# Patient Record
Sex: Female | Born: 1970 | Hispanic: Yes | Marital: Married | Smoking: Never smoker
Health system: Southern US, Community
[De-identification: ages and names within clinical notes are randomized; demographics above are authoritative.]

## PROBLEM LIST (undated history)

## (undated) DIAGNOSIS — E785 Hyperlipidemia, unspecified: Secondary | ICD-10-CM

## (undated) DIAGNOSIS — J449 Chronic obstructive pulmonary disease, unspecified: Secondary | ICD-10-CM

## (undated) DIAGNOSIS — F429 Obsessive-compulsive disorder, unspecified: Secondary | ICD-10-CM

## (undated) DIAGNOSIS — T7840XA Allergy, unspecified, initial encounter: Secondary | ICD-10-CM

## (undated) DIAGNOSIS — J45909 Unspecified asthma, uncomplicated: Secondary | ICD-10-CM

## (undated) DIAGNOSIS — B009 Herpesviral infection, unspecified: Secondary | ICD-10-CM

## (undated) DIAGNOSIS — E119 Type 2 diabetes mellitus without complications: Secondary | ICD-10-CM

## (undated) DIAGNOSIS — D649 Anemia, unspecified: Secondary | ICD-10-CM

## (undated) DIAGNOSIS — H409 Unspecified glaucoma: Secondary | ICD-10-CM

## (undated) DIAGNOSIS — B0050 Herpesviral ocular disease, unspecified: Secondary | ICD-10-CM

## (undated) HISTORY — DX: Allergy, unspecified, initial encounter: T78.40XA

## (undated) HISTORY — PX: NO PAST SURGERIES: SHX2092

## (undated) HISTORY — DX: Obsessive-compulsive disorder, unspecified: F42.9

## (undated) HISTORY — DX: Hyperlipidemia, unspecified: E78.5

## (undated) HISTORY — DX: Anemia, unspecified: D64.9

## (undated) HISTORY — DX: Herpesviral ocular disease, unspecified: B00.50

## (undated) HISTORY — DX: Unspecified asthma, uncomplicated: J45.909

---

## 2002-09-22 ENCOUNTER — Emergency Department (HOSPITAL_COMMUNITY): Admission: EM | Admit: 2002-09-22 | Discharge: 2002-09-22 | Payer: Self-pay | Admitting: Emergency Medicine

## 2004-06-24 ENCOUNTER — Ambulatory Visit: Payer: Self-pay | Admitting: Family Medicine

## 2004-07-29 ENCOUNTER — Ambulatory Visit: Payer: Self-pay | Admitting: *Deleted

## 2004-07-30 ENCOUNTER — Ambulatory Visit: Payer: Self-pay | Admitting: Family Medicine

## 2004-08-31 ENCOUNTER — Ambulatory Visit: Payer: Self-pay | Admitting: Family Medicine

## 2004-09-02 ENCOUNTER — Emergency Department (HOSPITAL_COMMUNITY): Admission: EM | Admit: 2004-09-02 | Discharge: 2004-09-02 | Payer: Self-pay | Admitting: Emergency Medicine

## 2004-09-07 ENCOUNTER — Ambulatory Visit: Payer: Self-pay | Admitting: Family Medicine

## 2004-09-19 ENCOUNTER — Emergency Department (HOSPITAL_COMMUNITY): Admission: EM | Admit: 2004-09-19 | Discharge: 2004-09-19 | Payer: Self-pay | Admitting: Emergency Medicine

## 2004-10-13 ENCOUNTER — Ambulatory Visit: Payer: Self-pay | Admitting: Family Medicine

## 2004-12-29 ENCOUNTER — Emergency Department (HOSPITAL_COMMUNITY): Admission: EM | Admit: 2004-12-29 | Discharge: 2004-12-29 | Payer: Self-pay | Admitting: Emergency Medicine

## 2005-06-01 ENCOUNTER — Ambulatory Visit: Payer: Self-pay | Admitting: Internal Medicine

## 2005-06-21 ENCOUNTER — Ambulatory Visit: Payer: Self-pay | Admitting: Family Medicine

## 2005-10-26 ENCOUNTER — Other Ambulatory Visit: Admission: RE | Admit: 2005-10-26 | Discharge: 2005-10-26 | Payer: Self-pay | Admitting: Obstetrics & Gynecology

## 2005-12-09 ENCOUNTER — Emergency Department (HOSPITAL_COMMUNITY): Admission: EM | Admit: 2005-12-09 | Discharge: 2005-12-09 | Payer: Self-pay | Admitting: Emergency Medicine

## 2008-03-19 ENCOUNTER — Emergency Department (HOSPITAL_COMMUNITY): Admission: EM | Admit: 2008-03-19 | Discharge: 2008-03-19 | Payer: Self-pay | Admitting: Emergency Medicine

## 2009-12-02 ENCOUNTER — Encounter: Admission: RE | Admit: 2009-12-02 | Discharge: 2009-12-02 | Payer: Self-pay | Admitting: Ophthalmology

## 2010-12-10 ENCOUNTER — Emergency Department (HOSPITAL_BASED_OUTPATIENT_CLINIC_OR_DEPARTMENT_OTHER)
Admission: EM | Admit: 2010-12-10 | Discharge: 2010-12-10 | Disposition: A | Discharge status: Home / Self Care | Payer: Self-pay | Attending: Emergency Medicine | Admitting: Emergency Medicine

## 2010-12-10 ENCOUNTER — Emergency Department (INDEPENDENT_AMBULATORY_CARE_PROVIDER_SITE_OTHER): Payer: Self-pay

## 2010-12-10 DIAGNOSIS — R05 Cough: Secondary | ICD-10-CM | POA: Insufficient documentation

## 2010-12-10 DIAGNOSIS — R059 Cough, unspecified: Secondary | ICD-10-CM | POA: Insufficient documentation

## 2010-12-10 DIAGNOSIS — J45909 Unspecified asthma, uncomplicated: Secondary | ICD-10-CM | POA: Insufficient documentation

## 2010-12-31 ENCOUNTER — Emergency Department (HOSPITAL_BASED_OUTPATIENT_CLINIC_OR_DEPARTMENT_OTHER)
Admission: EM | Admit: 2010-12-31 | Discharge: 2010-12-31 | Disposition: A | Discharge status: Home / Self Care | Payer: Self-pay | Attending: Orthopaedic Surgery | Admitting: Orthopaedic Surgery

## 2010-12-31 DIAGNOSIS — J40 Bronchitis, not specified as acute or chronic: Secondary | ICD-10-CM | POA: Insufficient documentation

## 2010-12-31 DIAGNOSIS — H109 Unspecified conjunctivitis: Secondary | ICD-10-CM | POA: Insufficient documentation

## 2010-12-31 DIAGNOSIS — H5789 Other specified disorders of eye and adnexa: Secondary | ICD-10-CM | POA: Insufficient documentation

## 2011-01-24 ENCOUNTER — Emergency Department (HOSPITAL_BASED_OUTPATIENT_CLINIC_OR_DEPARTMENT_OTHER)
Admission: EM | Admit: 2011-01-24 | Discharge: 2011-01-24 | Disposition: A | Discharge status: Home / Self Care | Payer: Self-pay | Attending: Emergency Medicine | Admitting: Emergency Medicine

## 2011-01-24 DIAGNOSIS — H109 Unspecified conjunctivitis: Secondary | ICD-10-CM | POA: Insufficient documentation

## 2011-01-24 DIAGNOSIS — H5789 Other specified disorders of eye and adnexa: Secondary | ICD-10-CM | POA: Insufficient documentation

## 2011-02-06 ENCOUNTER — Emergency Department (HOSPITAL_BASED_OUTPATIENT_CLINIC_OR_DEPARTMENT_OTHER)
Admission: EM | Admit: 2011-02-06 | Discharge: 2011-02-06 | Disposition: A | Discharge status: Home / Self Care | Payer: Self-pay | Attending: Emergency Medicine | Admitting: Emergency Medicine

## 2011-02-06 DIAGNOSIS — L03319 Cellulitis of trunk, unspecified: Secondary | ICD-10-CM | POA: Insufficient documentation

## 2011-02-06 DIAGNOSIS — L02219 Cutaneous abscess of trunk, unspecified: Secondary | ICD-10-CM | POA: Insufficient documentation

## 2011-02-13 ENCOUNTER — Emergency Department (HOSPITAL_BASED_OUTPATIENT_CLINIC_OR_DEPARTMENT_OTHER)
Admission: EM | Admit: 2011-02-13 | Discharge: 2011-02-13 | Disposition: A | Discharge status: Home / Self Care | Payer: Self-pay | Attending: Emergency Medicine | Admitting: Emergency Medicine

## 2011-02-13 DIAGNOSIS — Z09 Encounter for follow-up examination after completed treatment for conditions other than malignant neoplasm: Secondary | ICD-10-CM | POA: Insufficient documentation

## 2011-03-04 ENCOUNTER — Ambulatory Visit (INDEPENDENT_AMBULATORY_CARE_PROVIDER_SITE_OTHER): Payer: Self-pay | Admitting: Family Medicine

## 2011-03-04 ENCOUNTER — Encounter: Payer: Self-pay | Admitting: Family Medicine

## 2011-03-04 DIAGNOSIS — F429 Obsessive-compulsive disorder, unspecified: Secondary | ICD-10-CM

## 2011-03-04 DIAGNOSIS — L0291 Cutaneous abscess, unspecified: Secondary | ICD-10-CM

## 2011-03-04 DIAGNOSIS — B0230 Zoster ocular disease, unspecified: Secondary | ICD-10-CM | POA: Insufficient documentation

## 2011-03-04 DIAGNOSIS — K649 Unspecified hemorrhoids: Secondary | ICD-10-CM

## 2011-03-04 DIAGNOSIS — J42 Unspecified chronic bronchitis: Secondary | ICD-10-CM | POA: Insufficient documentation

## 2011-03-04 DIAGNOSIS — E669 Obesity, unspecified: Secondary | ICD-10-CM

## 2011-03-04 MED ORDER — FLUOXETINE HCL 20 MG PO CAPS: 20.0000 mg | ORAL_CAPSULE | Freq: Every day | ORAL | Status: DC

## 2011-03-04 MED ORDER — DOXYCYCLINE HYCLATE 100 MG PO TABS: 100.0000 mg | ORAL_TABLET | Freq: Two times a day (BID) | ORAL | Status: AC

## 2011-03-04 MED ORDER — HYDROCORTISONE ACE-PRAMOXINE 1-1 % RE FOAM: 1.0000 | Freq: Two times a day (BID) | RECTAL | Status: AC

## 2011-03-04 NOTE — Patient Instructions (Addendum)
Try the "Flat Belly Diet"  By Prevention ----or below is another option.      Dieta basada en el recuento de caloras (Calorie Counting Diet) Una dieta basada en el recuento de caloras requiere que consuma la cantidad de caloras que usted necesita durante Medical laboratory scientific officer. Las caloras son la medida de la energa que se obtienen de los alimentos que consumimos. Ingerir la cantidad [place] de caloras es importante para Pharmacologist un peso saludable. Si ingiere demasiadas caloras, el organismo las [employer] y aumentar de [place]. Si ingiere pocas caloras, perder peso. El recuento de las caloras que se ingieren durante Designer, fashion/clothing si recibe la cantidad Geologist, engineering. Un nutricionista matriculado podr determinar cuntas caloras necesita por da. La cantidad de caloras necesarias vara de Neomia Dear persona a otra. Si su objetivo es perder peso deber consumir menos caloras. Si tiene sobrepeso o tiene problemas de [employer] cardacas, hipertensin arterial o diabetes, la prdida de algunos kilos lo beneficiar. Si su objetivo es aumentar de peso deber consumir ms caloras. Si tiene problemas de salud que requieran que usted tenga ms [place], puede ser necesario que aumente de [place]. CONSEJOS Ya sea que aumente o disminuya el nmero de caloras que consume [employer], puede ser difcil acostumbrarse a Multimedia programmer los hbitos de lo que come o bebe. Los siguientes consejos lo ayudarn a Midwife un control del nmero de caloras que consume.  La medicin de los [employer] hogar, con una jarra medidora lo ayudar a saber la cantidad real de alimentos y caloras que consume.   En los restaurantes se sirven alimentos en diferentes porciones y tamaos. Es frecuente que en los restaurantes se sirvan los alimentos en cantidades que puedan dividirse en 2 porciones o ms. Al salir a Psychologist, forensic, puede ser til estimar cuntas porciones le sirven. Por ejemplo, una porcin a arroz cocido  es de  taza y tiene el tamao de la mitad de un puo. Conocer el tamao de las porciones le ayudar a tener una mejor idea de la cantidad de comida que come en un restaurante.   Pida porciones pequeas o solicite las porciones para nios.   Planifique comer la mitad de una porcin en el restaurante y lleve el resto a su hogar o comprtala con un amigo.   Lea las etiquetas para conocer el contenido de caloras y el tamao de las porciones.   La mayora de los alimentos envasados tiene la informacin nutricional en algn lugar del envase. Aqu podr encontrar cuntas porciones contiene el envase, el tamao de la porcin y el nmero de caloras de [place].   El tamao de la porcin y el nmero de porciones por envase estn enumeradas inmediatamente despus del ttulo Informacin Nutricional. Debajo de la informacin acerca de las porciones, encontrar el nmero de caloras por cada porcin.   Por ejemplo, digamos que un envase contiene tres galletitas. En la Informacin Nutricional se [place] que una porcin corresponde a Financial controller. Debajo, dice que hay tres porciones en el envase. En la seccin de las caloras se informa que contiene 90 caloras. Esto significa que cada galletita tiene 90 caloras. Si come una galletita, habr consumido 90 caloras. Si come tres galletitas, habr consumido tres veces esa cantidad, o sea 270 caloras.  La lista que sigue le mostrar el tamao de algunas porciones comunes.   1 onza...Marland KitchenMarland KitchenMarland Kitchen 4 dados apilados   3 onzas.....Marland Kitchen1 mazo de cartas   1 cucharadita ... Marland KitchenLa punta del  dedo meique.   1 cucharada.....la punta del pulgar   2 cucharadas .Marland Kitchen..Marland Kitchen1 pelota de golf    taza........la mitad de un puo.   1 taza.........un puo  LLEVE UN REGISTRO DE LO QUE COME Escriba todo lo que come, las cantidades y Medical illustrator de Advertising copywriter en cada comida que haga Administrator. Al final o durante el da puede ir sumando la cantidad de caloras que ha ingerido.  Puede ser de  utilidad crear Neomia Dear lista como la que se [place] a continuacin. Conozca la informacin sobre caloras [employer].  Desayuno   Salvado (1 taza, 110 caloras)   Leche descremada (1/2 taza, 45 caloras).   Colacin   Manzana (1 mediana, 80 caloras)   Almuerzo:   Espinaca (1 taza, 20 caloras)   Tomate (1/2 mediana, 20 caloras)   Pollo, pechuga (3 onzas, 165 caloras)   Queso shredded cheddar ( taza, 110 calories).   Aderezo italiano diettico (2 cucharadas, 60 caloras).   Pan de trigo integral (1 rebanada, 80 caloras).   Margarina (1 cucharada, 35 caloras).   Sopa de vegetales (1 taza, 160 caloras)   Cena   Chuleta de cerdo (3 onzas, 190 caloras)   Arroz marrn (1 taza, 215 caloras)   Brcoli hervido ( cup, 20 calories).   Jinny Sanders (1 1/4 taza, 65 caloras)   Crema batida (1 cucharada, 50 caloras)  Total de caloras diarias: 1425 Esta informacin proviene de www.eatright.org, Foodwise Nutritional Analysis Database. Document Released: 01/26/2009  [facility] Patient Information 2011 [place], [place]. Hemorroides (Hemorrhoids) El profesional que lo asiste ha diagnosticado que usted sufre de hemorroides. Las hemorroides son venas dilatadas (agrandadas) alrededor del recto. Podrn formarse cogulos, por lo que se hincharn y dolern. stas se denominan hemorroides trombosadas. Entre las causas de hemorroides se incluyen:  El [place], porque aumenta la presin en las venas hemorroidales   El estreimiento   Otros motivos que impidan mover el intestino  INSTRUCCIONES PARA EL CUIDADO DOMICILIARIO  Consuma una dieta bien balanceada y beba entre 6 y 8 vasos de agua todos los [address]  [address] para Multimedia programmer estreimiento. Tambin puede usar Metamucil o un laxante de [place].   Evite hacer fuerza al mover el intestino.   Mantenga la regin anal limpia y seca.   Utilice los medicamentos de venta libre o de prescripcin para Chief Technology Officer, Environmental health practitioner o la [employer], segn  se lo indique el profesional que lo asiste.  Si se produce un trombo:  Tome baos de asiento calientes durante 20 a 30 minutos, 3 a 4 veces por da.   Si las hemorroides le duelen y estn hinchadas, colocarse compresas de hielo en la zona en la medida que lo tolere, entre los baos de asiento ser beneficioso. Llene una bolsa plstica con hielo y coloque una toalla entre la bolsa de hielo y la piel.   Puede usar o Contractor segn las indicaciones algunas cremas especiales y supositorios ([place], [address], [place]).   No utilice una almohada en forma de rosca ni se siente en el inodoro durante perodos prolongados. Esto aumenta la afluencia de sangre y Chief Technology Officer.   Mueva el intestino cuando sienta urgencia; esto har que requiera menos esfuerzo y Teacher, early years/pre y la presin.   Utilice los medicamentos de venta libre o de prescripcin para Chief Technology Officer, Environmental health practitioner o la [place], segn se lo indique el profesional que lo asiste.  CONCURRA NUEVAMENTE A ESTE CENTRO O CONSULTE AL PROFESIONAL QUE LO ASISTE SI:  Aumenta el dolor y  la hinchazn y no puede controlarlo con los medicamentos.   La hemorragia no se puede controlar.   Hay incapacidad o dificultad para mover el intestino.   Siente dolor o tiene inflamacin fuera de la zona de las hemorroides.   Siente escalofros o la temperatura oral se eleva sin motivo por encima de 100.4 y persiste por ms de 2 [address]  [address], o segn le indique el profesional que lo asiste.  EST SEGURO QUE:   Comprende las instrucciones para el alta mdica.   Controlar su enfermedad.   Solicitar atencin mdica de inmediato segn las indicaciones.  Document Released: 10/10/2005 Document Re-Released: 09/22/2008 [facility] Patient Information 2011 [place], [place].

## 2011-03-04 NOTE — Progress Notes (Signed)
  Subjective:    Patient ID: Claire Hill, female    DOB: April 15, 1971, 40 y.o.   MRN: [id number]  HPI Pt here with husband f/u abscess in R axilla--pt went to ED 2x and was on abx.  It is still draining but is much better.   Pt also has a hemorrhoid that she is using suppositories for but it is not getting better.   Review of Systems As above    Objective:   Physical Exam  Constitutional: She is oriented to person, place, and time. She appears well-developed and well-nourished.  Neck: Normal range of motion. Neck supple.  Cardiovascular: Normal rate, regular rhythm and normal heart sounds.   No murmur heard. Pulmonary/Chest: Effort normal and breath sounds normal.  Abdominal: Soft. Bowel sounds are normal.  Musculoskeletal: Normal range of motion.  Neurological: She is alert and oriented to person, place, and time.  Skin: Skin is warm and dry.       R axilla--- + abscess , small , draining   Psychiatric: She has a normal mood and affect. Her behavior is normal. Judgment and thought content normal.          Assessment & Plan:

## 2011-03-05 ENCOUNTER — Encounter: Payer: Self-pay | Admitting: Family Medicine

## 2011-03-05 DIAGNOSIS — E669 Obesity, unspecified: Secondary | ICD-10-CM | POA: Insufficient documentation

## 2011-03-05 DIAGNOSIS — K649 Unspecified hemorrhoids: Secondary | ICD-10-CM | POA: Insufficient documentation

## 2011-03-05 NOTE — Assessment & Plan Note (Signed)
Proctofoam bid To surgeon if no relief

## 2011-03-05 NOTE — Assessment & Plan Note (Signed)
con't doxy for 10 more days Warm compresses

## 2011-03-05 NOTE — Assessment & Plan Note (Signed)
On life long acyclovir

## 2011-03-05 NOTE — Assessment & Plan Note (Signed)
Discussed diet and exercise with pt 

## 2011-03-09 ENCOUNTER — Other Ambulatory Visit: Payer: Self-pay | Admitting: Family Medicine

## 2011-04-04 ENCOUNTER — Ambulatory Visit: Payer: Self-pay | Admitting: Family Medicine

## 2011-12-22 ENCOUNTER — Encounter: Payer: Self-pay | Admitting: Family Medicine

## 2011-12-22 ENCOUNTER — Ambulatory Visit (INDEPENDENT_AMBULATORY_CARE_PROVIDER_SITE_OTHER): Payer: BC Managed Care – PPO | Admitting: Family Medicine

## 2011-12-22 DIAGNOSIS — Z0000 Encounter for general adult medical examination without abnormal findings: Secondary | ICD-10-CM

## 2011-12-22 NOTE — Progress Notes (Signed)
  Subjective:    Patient ID: Claire Hill, female    DOB: 02-15-1971, 41 y.o.   MRN: [id number]  HPI Pt was supposed to be a physical---- will reschedule  Review of Systems     Objective:   Physical Exam        Assessment & Plan:

## 2012-01-13 ENCOUNTER — Ambulatory Visit (INDEPENDENT_AMBULATORY_CARE_PROVIDER_SITE_OTHER): Payer: BC Managed Care – PPO | Admitting: Family Medicine

## 2012-01-13 ENCOUNTER — Encounter: Payer: Self-pay | Admitting: Family Medicine

## 2012-01-13 VITALS — BP 112/70 | HR 97 | Temp 98.7°F | Ht 66.0 in | Wt 181.0 lb

## 2012-01-13 DIAGNOSIS — Z1239 Encounter for other screening for malignant neoplasm of breast: Secondary | ICD-10-CM

## 2012-01-13 DIAGNOSIS — N63 Unspecified lump in unspecified breast: Secondary | ICD-10-CM

## 2012-01-13 DIAGNOSIS — Z01419 Encounter for gynecological examination (general) (routine) without abnormal findings: Secondary | ICD-10-CM | POA: Insufficient documentation

## 2012-01-13 DIAGNOSIS — N6310 Unspecified lump in the right breast, unspecified quadrant: Secondary | ICD-10-CM

## 2012-01-13 DIAGNOSIS — Z0000 Encounter for general adult medical examination without abnormal findings: Secondary | ICD-10-CM

## 2012-01-13 DIAGNOSIS — Z124 Encounter for screening for malignant neoplasm of cervix: Secondary | ICD-10-CM

## 2012-01-13 LAB — CBC WITH DIFFERENTIAL/PLATELET
Basophils Absolute: 0.1 10*3/uL (ref 0.0–0.1)
Basophils Relative: 1.9 % (ref 0.0–3.0)
Eosinophils Absolute: 0.1 10*3/uL (ref 0.0–0.7)
Eosinophils Relative: 3.1 % (ref 0.0–5.0)
Monocytes Absolute: 0.2 10*3/uL (ref 0.1–1.0)
Neutro Abs: 1.3 10*3/uL — ABNORMAL LOW (ref 1.4–7.7)
RBC: 4.74 Mil/uL (ref 3.87–5.11)

## 2012-01-13 LAB — POCT URINALYSIS DIPSTICK
Bilirubin, UA: NEGATIVE
Blood, UA: NEGATIVE
Glucose, UA: NEGATIVE
Ketones, UA: NEGATIVE
Spec Grav, UA: >=1.03
pH, UA: 5

## 2012-01-13 LAB — BASIC METABOLIC PANEL
CO2: 26 mEq/L (ref 19–32)
Calcium: 9.8 mg/dL (ref 8.4–10.5)
Chloride: 100 mEq/L (ref 96–112)
Potassium: 4.1 mEq/L (ref 3.5–5.1)
Sodium: 136 mEq/L (ref 135–145)

## 2012-01-13 LAB — HEPATIC FUNCTION PANEL
AST: 23 U/L (ref 0–37)
Albumin: 4.3 g/dL (ref 3.5–5.2)
Total Protein: 7.3 g/dL (ref 6.0–8.3)

## 2012-01-13 LAB — LDL CHOLESTEROL, DIRECT: Direct LDL: 123.1 mg/dL

## 2012-01-13 LAB — LIPID PANEL
Cholesterol: 221 mg/dL — ABNORMAL HIGH (ref 0–200)
Total CHOL/HDL Ratio: 3

## 2012-01-13 NOTE — Patient Instructions (Addendum)
Cuidados preventivos en las mujeres adultas  (Preventive Care for Adults, Female) Un estilo de vida saludable y los cuidados preventivos pueden favorecer la salud y [place]. Las guas para Engineer, building services salud para las mujeres incluyen las siguientes prcticas clave:   Un examen fsico de rutina anual es un buen modo de Chief Operating Officer su salud y Education officer, environmental estudios preventivos. Le da la posibilidad de Agricultural consultant preocupaciones y Solicitor el estado de su salud, y que le realicen estudios completos.   Consulte al dentista para realizar un examen de rutina y cuidados preventivos cada 6 meses. Cepllese los dientes al [employer] veces por da y psese el hilo dental al menos una vez por da. Una buena higiene bucal evita caries y enfermedades de las encas.   La frecuencia con que debe hacerse exmenes de la vista depende de la edad, el estado de [place], la historia familiar, el uso de lentes de contacto y otros factores. Siga las recomendaciones del mdico para saber con qu frecuencia debe hacerse exmenes de la vista.   Consuma una dieta saludable. Los [employer], frutas, granos enteros, productos lcteos descremados y protenas magras contienen los nutrientes que usted necesita sin necesidad de consumir muchas caloras. Disminuya el consume de alimentos con alto contenido de grasas slidas, azcar y sal agregadas. Consuma la cantidad de caloras adecuada para usted.Si es necesario, pdale una dieta adecuada al profesional que lo asiste.   La actividad fsica regular es una de las cosas ms importantes que puede hacer por su salud. Los adultos deben hacer al menos 150 minutos de ejercicios de [place] de intensidad moderada (toda actividad que aumente la frecuencia cardaca y lo haga transpirar) cada semana. Adems, la [employer] de los adultos necesita ejercicios de fortalecimiento muscular 2  ms [employer].   Hay que mantener un peso saludable. El ndice de masa corporal [facility]) es una  herramienta que identifica posibles problemas con [place]. Proporciona una estimacin de la grasa corporal basndose en el peso y la altura. El mdico podr determinar si [facility] y podr ayudarlo a Personnel officer o Pharmacologist un peso saludable.Para los adultos de 20 aos o ms:   Un [facility] menor a 18,5 se considera bajo peso.   Un [facility] entre 18,5 y 24,9 es normal.   Un [facility] entre 25 y 29,9 es sobrepeso.   Un IMC entre 30 o ms es obesidad.   Mantenga un nivel normal de lpidos y colesterol en sangre practicando actividad fsica y minimizando la ingesta de grasas saturadas. Consuma una dieta balanceada y saludable, e incluya variedad de frutas y vegetales. Los [employer] de lpidos y Oncologist en sangre deben Games developer a los 20 aos y repetirse cada 5 aos. Si los niveles de colesterol son altos, tiene ms de 50 aos o tiene riesgo elevado de sufrir enfermedades cardacas necesitar controlarse con ms frecuencia.Si tiene [employer] de lpidos y colesterol, debe recibir tratamiento con medicamentos, si la dieta y el ejercicio no son efectivos.   Si fuma, consulte con [employer] de las opciones para dejar de [place]. Si no lo hace, no comience.   Si est embarazada no beba alcohol. Si est amamantando, beba alcohol con prudencia. Si elige beber alcohol, no se exceda de 1 medida por da. Se considera una medida a 12 onzas (355 ml) de cerveza, 5 onzas (148 ml) de vino, o 1,5 onzas (44 ml) de licor.   Evite el alcohol y el consumo de drogas. No comparta agujas. Pida ayuda si necesita  asistencia o instrucciones con respecto a abandonar el consumo de alcohol, cigarrillos o drogas.   La hipertensin arterial causa enfermedades cardacas y aumenta el riesgo de ictus. Debe controlar su presin arterial al menos cada 1 o 2 aos. La presin arterial elevada que persiste debe tratarse con medicamentos si la prdida de peso y el ejercicio no son efectivos.   Si tiene entre 55 y 79 aos, consulte a su mdico si  debe tomar aspirina para prevenir enfermedades cardacas.   Los anlisis para la diabetes incluyen la toma de una muestra de sangre para controlar el nivel de azcar en la sangre durante el ayuno. Debe hacerlo cada 3 aos despus de los 45 aos si est dentro de su peso normal y sin factores de riesgo para la diabetes. Las pruebas deben comenzar a edades tempranas o llevarse a cabo con ms frecuencia si tiene sobrepeso y al menos 1 factor de riesgo para la diabetes.   Las evaluaciones para detectar el cncer de mama son un mtodo preventivo fundamental para las mujeres. Debe practicar la "autoconciencia de las mamas". Esto significa que debe comprender como es la apariencia normal y como se sienten sus mamas e incluir un autoexamen. Si detecta algn cambio, no importa cun pequeo sea, debe informarlo a su mdico. Las mujeres entre 20 y 30 aos deben hacer un examen clnico de las mamas como parte del examen regular de salud, cada 1 a 3 aos. Despus de los 40 aos deben hacerlo todos los aos. Deben hacerse una mamografa cada ao, comenzando a los 40 aos. Las mujeres con historia familiar de cncer de mama deben hablar con el mdico para hacer un estudio gentico. Las que tienen ms riesgo deben hacerse una ecografa y una mamografa todos los aos.   Un papanicolau se realiza para diagnosticar cncer de cuello de tero. Muestra los cambios celulares en el cuello que pueden transformarse en cncer si no se tratan. El papanicolau es un procedimiento por el que se obtienen clulas de la parte inferior del tero (cuello) y son examinadas.   Las mujeres deben hacerse un papanicolau a partir de los 21 aos.   Entre los 21 y los 29 aos debe repetirse cada dos aos.   Luego de los 30 aos, debe realizarse un Papanicolaou cada tres aos siempre que los 3 estudios anteriores sean normales.   Algunas mujeres sufren problemas mdicos que aumenta la probabilidad de contraer cncer cervical. Consulte a su  mdico acerca de estos problemas. Es muy importante que le informe a su mdico si aparecen nuevos problemas poco despus de su ltimo Papanicolaou. En estos casos, el mdico podr indicar que se realice el Papanicolaou con ms frecuencia.   Estas recomendaciones son las mismas para todas las mujeres hayan recibido o no la vacuna para el VPH (virus del papiloma humano).   Si le han realizado una histerectoma por un problema que no era cncer u otra enfermedad que podra causar cncer, ya no necesitar un Papanicolaou. Sin embargo, si ya no necesita hacerse un Papanicolau, es una buena idea hacerse un examen regularmente para asegurarse de que no hay otros problemas.   Si tiene entre 65 y 70 aos y ha tenido un Papanicolaou normal en los ltimos 10 aos, ya no ser necesario realizarlo. Sin embargo, si ya no necesita hacerse un Papanicolau, es una buena idea hacerse un examen regularmente para asegurarse de que no hay otros problemas.   Si ha recibido un tratamiento para el cncer cervical o para   una enfermedad que podra causar cncer, necesitar realizar un Papanicolaou y controles durante al menos 20 aos de concluir el tratamiento.   Si no se ha hecho el examen con regularidad, debern volver a evaluarse los factores de riesgo (como el tener un nuevo compaero sexual) para determinar si debe volver a realizarse los estudios.   La prueba del VPH es un anlisis adicional que puede usarse para detectar cncer de cuello de tero. Esta prueba busca la presencia del virus que causa los cambios en el cuello. Las clulas que se recolectan durante el papanicolau pueden usarse para el VPH. La prueba para el VPH puede usarse para evaluar a mujeres de ms de 30 aos y debe usarse en mujeres de cualquier edad cuyos resultados del papanicolau no sean claros. Despus de los 30 aos, las mujeres deben hacerse el anlisis para el VPH con la misma frecuencia que el papanicolau.   El cncer colorectal puede detectarse  y con fecuencia puede prevenirse. La mayor parte de los estudios de rutina comienzan a los 50 aos y continan hasta los 75 aos. Sin embargo, el mdico podr aconsejarle que lo haga antes, si tiene factores de riesgo para el cncer de colon. Una vez por ao, el profesional le dar un kit de prueba para hallar sangre oculta en la materia fecal. Utiliza un tubo con una pequea cmara en su extremo para examinar directamente el colon (sigmoidoscopa o colonoscopa), para detectar formas temprana de cncer colorectal. Hable con su mdico si tiene 50 aos, cuando comience con los estudios de rutina. El examen directo del colon debe repetirse cada 5 a 10 aos, hasta los 75 aos, excepto que se encuentren formas tempranas de plipos precancerosos o pequeos bultos.   Se recomienda realizar un anlisis de sangre para detectar hepatitis C a todas las personas nacidas entre 1945 y 1965, y a todo aquel que tenga un riesgo conocido de haber contrado esta enfermedad.   Practique el sexo seguro. Use condones y evite las prcticas sexuales riesgosas para disminuir el contagio de enfermedades de transmisin sexual. Las enfermedades de transmisin sexual son la gonorrea, clamidia, sfilis, tricomonas, herpes, VPH y el virus de inmunodeficiencia humana (VIH). El herpes, el VIH y el VPH son enfermedades virales que no tienen cura. Pueden producir discapacidad, cncer y hasta la muerte. Las mujeres sexualmente activas de 25 aos o menos deben ser evaluadas para detectar clamidia. Las mujeres mayores que tengan mltiples compaeros tambin deben hacerse el anlisis para detectar clamidia. Se recomienda realizar anlisis para detectar otras enfermedades de transmisin sexual si es sexualmente activa y tiene riesgos.   La osteoporosis es una enfermedad en la que los huesos pierden los minerales y la fuerza por el avance de la edad. El resultado pueden ser fracturas graves en los huesos. El riesgo de osteoporosis puede  identificarse con una prueba de densidad sea. Las mujeres de ms de 65 aos y las que tengan riesgos de sufrir fracturas u osteoporosis deben pedir consejo a su mdico. Consulte a su mdico si debe tomar un suplemento de calcio o de vitamina D para reducir el riesgo de osteoporosis.   La menopausia se asocia a sntomas y riesgos fsicos. Se dispone de una terapia de reemplazo hormonal para disminuir los sntomas y los riesgos. Consulte a su mdico para saber si la terapia de reemplazo hormonal es conveniente para usted.   Use una pantalla solar con un factor SPF de 30 o mayor. Aplique pantalla de manera libre y repetida a lo largo   del da. Pngase al resguardo del sol cuando la sombra sea ms pequea que usted. Protjase usando mangas y [employer], un sombrero de ala ancha y gafas para el sol todo el ao, siempre que se encuentre en el exterior.   Una vez por mes hgase un examen de la piel de todo el cuerpo usando un espejo para ver la espalda. nforme al mdico si aparecen nuevos lunares, los que ya estn tienen bordes [address], los que sean ms grandes que una goma de lpiz o los que hayan cambiado su forma o color.   Mantngase al da con las vacunas.   [place]: Debe aplicarse una dosis todos en cada otoo (o invierno). La composicin de la vacuna de la gripe cambia todos los [place], por lo tanto no es suficiente con vacunarse una vez.   Vacuna antineumocccica de polisacridos Debe aplicarse 1  2 dosis si fuma o si sufre ciertas enfermedades crnicas. Necesitar 1 dosis a los 65 aos (o ms) si nunca se ha vacunado.   Vacuna difteria, ttanos, tos convulsa (DTP). Aplquese una dosis de la vacuna DTaP (vacuna contra la tos convulsa para adultos) si es menor de 65 aos, si tiene ms de 65 aos y est en contacto con un beb, es un trabajador de la [place], es una mujer embarazada o simplemente quiere estar protegido de la enfermedad. Luego necesitar un refuerzo de DT cada 10 aos. Consulte  con su mdico si no ha recibido al menos 3 dosis de la vacuna contra el ttanos (y la difteria) en algn momento de su vida o tiene una herida profunda o sucia.   VPH: Debe aplicarse esta vacuna si tiene 26 aos o menos. La vacuna se administra en 3 dosis, generalmente durante el curso de 6 meses.   MMR (sarampin, paperas, rubola) Debe aplicarse al menos una dosis de MMR si ha nacido en 1957 o despus. Podra tambin necesitar una segunda dosis.   Antimeningocccica Si tiene entre 19 y 53 aos y es un estudiante universitario de Therapist, occupational que vive en una residencia estudiantil, o tiene alguna enfermedad mdica, debe recibir esta vacuna. Podra tambin necesitar dosis de refuerzo.   Herpes zoster (culebrilla). Si tiene 60 aos o ms debe aplicarse esta vacuna ahora.   Varicela Si nunca se vacun o slo recibi una dosis, hable con su mdico para averiguar si necesita aplicarse esta vacuna.   Hepatitis A. Debe aplicarse esta vacuna si tiene un factor de riesgo especfico para contraer una infeccin por el virus de la hepatitis A, o simplemente desea estar protegido contra la enfermedad. La vacuna se administra en 2 dosis, con una diferencia entre 6 y 18 meses.   Hepatitis B. Debe aplicarse esta vacuna si tiene un factor de riesgo especfico para contraer una infeccin por el virus de la hepatitis B, o simplemente desea estar protegido contra la enfermedad. La vacuna se administra en 3 dosis, generalmente durante el curso de 6 meses.  Controles preventivos - Frecuencia Edad 19 a 39  Control de la presin arterial.** / Cada 1 a 2 aos.   Control de lpidos y colesterol.** / Cada 5 aos, comenzando a los 20 aos.   Examen clnico de mamas.** / Cada 3 aos en las [employer] 20 y los [address] [place].   Papanicolau.** / Cada 2 aos [employer] 21 y los [place]. Despus de los [address] [place], y [place] 65 o 70, con una historia de 3 papanicolau normales consecutivos.   Pruebas para  el VPH.** / Cada 3  aos, a partir de los [address] [place], y [employer] 65 o 70, con una historia de 3 papanicolau normales consecutivos.   Anlisis de sangre para la hepatitis C. ** / Para todo individuo con riesgos conocidos para la hepatitis C.   Autoexamen de piel. / Todos los [place].   Vacuna contra la gripe.** / [employer].   Vacuna antineumocccica de polisacridos.** / Debe aplicarse 1  2 dosis si fuma o si sufre ciertas enfermedades crnicas.   Vacuna difteria, ttanos, tos convulsa (Tdap, Td). / Una dosis nica de vacuna Tdap. Luego necesitar un refuerzo de DT cada 10 aos.   Vacuna Printmaker. / 3 dosis en el curso de 6 meses, si tiene 26 aos o menos.   MMR (sarampin, paperas, rubola). / Debe aplicarse al menos una dosis de MMR si ha nacido en 1957 o despus. Podra tambin necesitar una segunda dosis.   Vacunacin antimeningocccica. / Si tiene entre 58 y 60 aos y es un estudiante universitario de Therapist, occupational que vive en una residencia estudiantil, o tiene alguna enfermedad mdica, debe recibir esta vacuna. Podra tambin necesitar dosis de refuerzo.   Vacuna contra la varicela.** / Consltelo con el mdico.   Vacuna contra la hepatitis A.** / Consltelo con el mdico. 2 dosis, con un intervalo entre 6 a 18 meses.   Vacuna contra la hepatitis B.** / Consltelo con el mdico. 3 dosis en el curso de 6 meses.  Edad 40 a 64  Control de la presin arterial.** / Cada 1 a 2 aos.   Control de lpidos y colesterol. **/ Cada 5 aos, comenzando a los 20 aos.   Examen clnico de mamas.** / Todos los aos despus de los 40 aos.   Mamografa.** / Neomia Dear vez por ao a partir de los 40 aos y continuando siempre que tenga buena salud. Consulte con el mdico.   Papanicolau.** / Cada 3 aos despus de los [address] [place], y [place] 65 o 70, con una historia de 3 papanicolau normales consecutivos.   Pruebas para el VPH.** / Cada 3 aos despus de los [address] [place], y [place] 65 o 70, con una historia de 3  papanicolau normales consecutivos.   Prueba de sangre oculta en materia fecal. / Cada ao comenzando a los 50 aos continuando [employer] 75. No tendr que hacerlo si se ha hecho una colonoscopa cada 10 aos.   Sigmoidoscopa flexible** o colonoscopa.** / Cada 5 aos para la sigmoidoscopa flexible o cada 10 aos para la colonoscopa, comenzando a los 50 aos y continuando [employer] 75 aos.   Anlisis de sangre para la hepatitis C. ** / Para todas las personas [address] 1945 y 6 y a todo aquel que tenga un riesgo conocido para la hepatitis C.   Autoexamen de piel. / Todos los [place].   Vacuna contra la gripe.** / [employer].   Vacuna antineumocccica de polisacridos.** / Debe aplicarse 1  2 dosis si fuma o si sufre ciertas enfermedades crnicas.   Vacuna difteria, ttanos, tos convulsa (Tdap, Td). / Una dosis nica de vacuna Tdap. Luego necesitar un refuerzo de DT cada 10 aos.   MMR (sarampin, paperas, rubola). / Debe aplicarse al menos una dosis de MMR si ha nacido en 1957 o despus. Podra tambin necesitar una segunda dosis.   Vacuna contra la varicela.**/ Consltelo con el mdico.   Vacunacin antimeningocccica. / Consltelo con el mdico.   Madilyn Fireman contra  la hepatitis A.**/ Consltelo con el mdico. 2 dosis, con un intervalo entre 6 a 18 meses.   Vacuna contra la hepatitis B.** / Consltelo con el mdico. 3 dosis en el curso de 6 meses.  Edad 65 o ms  Control de la presin arterial.** / Cada 1 a 2 aos.   Control de lpidos y colesterol. **/ Cada 5 aos, comenzando a los 20 aos.   Examen clnico de mamas.** / Todos los aos despus de los 40 aos.   Mamografa.** / Neomia Dear vez por ao a partir de los 40 aos y continuando siempre que tenga buena salud. Consulte con el mdico.   Papanicolau. ** / Cada 3 aos despus de los [address] [place], y [place] 65 o 70, con una historia de 3 papanicolau normales consecutivos. Las pruebas pueden [employer] 65 y los 70  aos, si tiene 3 papanicolau consecutivos normales y no tuvo un papanicoalu ni prueba de VPH [employer] ltimos 10 aos.   Pruebas para el VPH.** / Cada 3 aos despus de los [address] [place], y [place] 65 o 70, con una historia de 3 papanicolau normales consecutivos. Las pruebas pueden [employer] 65 y los 70 aos, si tiene 3 papanicolau consecutivos normales y no tuvo un papanicoalu ni prueba de VPH [employer] ltimos 10 aos.   Prueba de sangre oculta en materia fecal. / Cada ao comenzando a los 50 aos continuando [employer] 75. No tendr que hacerlo si se ha hecho una colonoscopa cada 10 aos.   Sigmoidoscopa flexible** o colonoscopa.** / Cada 5 aos para la sigmoidoscopa flexible o cada 10 aos para la colonoscopa, comenzando a los 50 aos y continuando [employer] 75 aos.   Anlisis de sangre para la hepatitis C. ** / Para todas las personas [address] 1945 y 64 y a todo aquel que tenga un riesgo conocido para la hepatitis C.   Pruebas para la osteoporosis.** / Por nica vez en mujeres de ms de 65 aos que tengan riesgo de fracturas u osteoporosis.   Autoexamen de piel. / Todos los [place].   Vacuna contra la gripe.** / [employer].   Vacuna antineumocccica de polisacridos.** / Necesitar 1 dosis a los 65 aos (o ms) si nunca se ha vacunado.   Vacuna difteria, ttanos, tos convulsa (Tdap, Td). / Aplquese una dosis de la vacuna DTaP (vacuna contra la tos convulsa para adultos) si tiene ms de 65 aos y est en contacto con un beb, es un trabajador de [address], es una mujer embarazada o simplemente quiere estar protegido de la enfermedad. Luego necesitar un refuerzo de DT cada 10 aos.   Vacuna contra la varicela.**/ Consltelo con el mdico.   Vacunacin antimeningocccica.** / Consltelo con el mdico.   Vacuna contra la hepatitis A.** / Consltelo con el mdico. 2 dosis, con un intervalo entre 6 a 18 meses.   Vacuna contra la hepatitis B.** / Consulte  con el mdico. 3 dosis en el curso de 6 meses.  **La historia familiar y personal de riesgos y enfermedades puede cambiar las recomendaciones del mdico. Document Released: 07/20/2005 Document Revised: 09/29/2011 [facility] Patient Information 2012 [place], [place].Preventive Care for Adults, Female A healthy lifestyle and preventive care can promote health and wellness. Preventive health guidelines for women include the following key practices.  A routine yearly physical is a good way to check with your caregiver about your health and preventive screening. It is a chance to share any  concerns and updates on your health, and to receive a thorough exam.   Visit your dentist for a routine exam and preventive care every 6 months. Brush your teeth twice a day and floss once a day. Good oral hygiene prevents tooth decay and gum disease.   The frequency of eye exams is based on your age, health, family medical history, use of contact lenses, and other factors. Follow your caregiver's recommendations for frequency of eye exams.   Eat a healthy diet. Foods like vegetables, fruits, whole grains, low-fat dairy products, and lean protein foods contain the nutrients you need without too many calories. Decrease your intake of foods high in solid fats, added sugars, and salt. Eat the right amount of calories for you.Get information about a proper diet from your caregiver, if necessary.   Regular physical exercise is one of the most important things you can do for your health. Most adults should get at least 150 minutes of moderate-intensity exercise (any activity that increases your heart rate and causes you to sweat) each week. In addition, most adults need muscle-strengthening exercises on 2 or more days a week.   Maintain a healthy weight. The body mass index (BMI) is a screening tool to identify possible weight problems. It provides an estimate of body fat based on height and weight. Your caregiver can help  determine your BMI, and can help you achieve or maintain a healthy weight.For adults 20 years and older:   A BMI below 18.5 is considered underweight.   A BMI of 18.5 to 24.9 is normal.   A BMI of 25 to 29.9 is considered overweight.   A BMI of 30 and above is considered obese.   Maintain normal blood lipids and cholesterol levels by exercising and minimizing your intake of saturated fat. Eat a balanced diet with plenty of fruit and vegetables. Blood tests for lipids and cholesterol should begin at age 60 and be repeated every 5 years. If your lipid or cholesterol levels are high, you are over 50, or you are at high risk for heart disease, you may need your cholesterol levels checked more frequently.Ongoing high lipid and cholesterol levels should be treated with medicines if diet and exercise are not effective.   If you smoke, find out from your caregiver how to quit. If you do not use tobacco, do not start.   If you are pregnant, do not drink alcohol. If you are breastfeeding, be very cautious about drinking alcohol. If you are not pregnant and choose to drink alcohol, do not exceed 1 drink per day. One drink is considered to be 12 ounces (355 mL) of beer, 5 ounces (148 mL) of wine, or 1.5 ounces (44 mL) of liquor.   Avoid use of street drugs. Do not share needles with anyone. Ask for help if you need support or instructions about stopping the use of drugs.   High blood pressure causes heart disease and increases the risk of stroke. Your blood pressure should be checked at least every 1 to 2 years. Ongoing high blood pressure should be treated with medicines if weight loss and exercise are not effective.   If you are 66 to 41 years old, ask your caregiver if you should take aspirin to prevent strokes.   Diabetes screening involves taking a blood sample to check your fasting blood sugar level. This should be done once every 3 years, after age 53, if you are within normal weight and without  risk factors for diabetes.  Testing should be considered at a younger age or be carried out more frequently if you are overweight and have at least 1 risk factor for diabetes.   Breast cancer screening is essential preventive care for women. You should practice "breast self-awareness." This means understanding the normal appearance and feel of your breasts and may include breast self-examination. Any changes detected, no matter how small, should be reported to a caregiver. Women in their 11s and 30s should have a clinical breast exam (CBE) by a caregiver as part of a regular health exam every 1 to 3 years. After age 20, women should have a CBE every year. Starting at age 71, women should consider having a mammography (breast X-ray test) every year. Women who have a family history of breast cancer should talk to their caregiver about genetic screening. Women at a high risk of breast cancer should talk to their caregivers about having magnetic resonance imaging (MRI) and a mammography every year.   The Pap test is a screening test for cervical cancer. A Pap test can show cell changes on the cervix that might become cervical cancer if left untreated. A Pap test is a procedure in which cells are obtained and examined from the lower end of the uterus (cervix).   Women should have a Pap test starting at age 70.   Between ages 27 and 4, Pap tests should be repeated every 2 years.   Beginning at age 68, you should have a Pap test every 3 years as long as the past 3 Pap tests have been normal.   Some women have medical problems that increase the chance of getting cervical cancer. Talk to your caregiver about these problems. It is especially important to talk to your caregiver if a new problem develops soon after your last Pap test. In these cases, your caregiver may recommend more frequent screening and Pap tests.   The above recommendations are the same for women who have or have not gotten the vaccine for  human papillomavirus (HPV).   If you had a hysterectomy for a problem that was not cancer or a condition that could lead to cancer, then you no longer need Pap tests. Even if you no longer need a Pap test, a regular exam is a good idea to make sure no other problems are starting.   If you are between ages 54 and 77, and you have had normal Pap tests going back 10 years, you no longer need Pap tests. Even if you no longer need a Pap test, a regular exam is a good idea to make sure no other problems are starting.   If you have had past treatment for cervical cancer or a condition that could lead to cancer, you need Pap tests and screening for cancer for at least 20 years after your treatment.   If Pap tests have been discontinued, risk factors (such as a new sexual partner) need to be reassessed to determine if screening should be resumed.   The HPV test is an additional test that may be used for cervical cancer screening. The HPV test looks for the virus that can cause the cell changes on the cervix. The cells collected during the Pap test can be tested for HPV. The HPV test could be used to screen women aged 32 years and older, and should be used in women of any age who have unclear Pap test results. After the age of 56, women should have HPV testing at the  same frequency as a Pap test.   Colorectal cancer can be detected and often prevented. Most routine colorectal cancer screening begins at the age of 71 and continues through age 12. However, your caregiver may recommend screening at an earlier age if you have risk factors for colon cancer. On a yearly basis, your caregiver may provide home test kits to check for hidden blood in the stool. Use of a small camera at the end of a tube, to directly examine the colon (sigmoidoscopy or colonoscopy), can detect the earliest forms of colorectal cancer. Talk to your caregiver about this at age 62, when routine screening begins. Direct examination of the colon  should be repeated every 5 to 10 years through age 74, unless early forms of pre-cancerous polyps or small growths are found.   Hepatitis C blood testing is recommended for all people born from [birth year] through 1965 and any individual with known risks for hepatitis C.   Practice safe sex. Use condoms and avoid high-risk sexual practices to reduce the spread of sexually transmitted infections (STIs). STIs include gonorrhea, chlamydia, syphilis, trichomonas, herpes, HPV, and human immunodeficiency virus (HIV). Herpes, HIV, and HPV are viral illnesses that have no cure. They can result in disability, cancer, and death. Sexually active women aged 35 and younger should be checked for chlamydia. Older women with new or multiple partners should also be tested for chlamydia. Testing for other STIs is recommended if you are sexually active and at increased risk.   Osteoporosis is a disease in which the bones lose minerals and strength with aging. This can result in serious bone fractures. The risk of osteoporosis can be identified using a bone density scan. Women ages 88 and over and women at risk for fractures or osteoporosis should discuss screening with their caregivers. Ask your caregiver whether you should take a calcium supplement or vitamin D to reduce the rate of osteoporosis.   Menopause can be associated with physical symptoms and risks. Hormone replacement therapy is available to decrease symptoms and risks. You should talk to your caregiver about whether hormone replacement therapy is right for you.   Use sunscreen with sun protection factor (SPF) of 30 or more. Apply sunscreen liberally and repeatedly throughout the day. You should seek shade when your shadow is shorter than you. Protect yourself by wearing long sleeves, pants, a wide-brimmed hat, and sunglasses year round, whenever you are outdoors.   Once a month, do a whole body skin exam, using a mirror to look at the skin on your back. Notify your  caregiver of new moles, moles that have irregular borders, moles that are larger than a pencil eraser, or moles that have changed in shape or color.   Stay current with required immunizations.   Influenza. You need a dose every fall (or winter). The composition of the flu vaccine changes each year, so being vaccinated once is not enough.   Pneumococcal polysaccharide. You need 1 to 2 doses if you smoke cigarettes or if you have certain chronic medical conditions. You need 1 dose at age 26 (or older) if you have never been vaccinated.   Tetanus, diphtheria, pertussis (Tdap, Td). Get 1 dose of Tdap vaccine if you are younger than age 38, are over 34 and have contact with an infant, are a Research scientist (physical sciences), are pregnant, or simply want to be protected from whooping cough. After that, you need a Td booster dose every 10 years. Consult your caregiver if you have not had at  least 3 tetanus and diphtheria-containing shots sometime in your life or have a deep or dirty wound.   HPV. You need this vaccine if you are a woman age 48 or younger. The vaccine is given in 3 doses over 6 months.   Measles, mumps, rubella (MMR). You need at least 1 dose of MMR if you were born in 1957 or later. You may also need a second dose.   Meningococcal. If you are age 63 to 36 and a first-year college student living in a residence hall, or have one of several medical conditions, you need to get vaccinated against meningococcal disease. You may also need additional booster doses.   Zoster (shingles). If you are age 54 or older, you should get this vaccine.   Varicella (chickenpox). If you have never had chickenpox or you were vaccinated but received only 1 dose, talk to your caregiver to find out if you need this vaccine.   Hepatitis A. You need this vaccine if you have a specific risk factor for hepatitis A virus infection or you simply wish to be protected from this disease. The vaccine is usually given as 2 doses, 6 to 18  months apart.   Hepatitis B. You need this vaccine if you have a specific risk factor for hepatitis B virus infection or you simply wish to be protected from this disease. The vaccine is given in 3 doses, usually over 6 months.  Preventive Services / Frequency Ages 51 to 40  Blood pressure check.** / Every 1 to 2 years.   Lipid and cholesterol check.** / Every 5 years beginning at age 56.   Clinical breast exam.** / Every 3 years for women in their 17s and 30s.   Pap test.** / Every 2 years from ages 22 through 104. Every 3 years starting at age 49 through age 32 or 66 with a history of 3 consecutive normal Pap tests.   HPV screening.** / Every 3 years from ages 69 through ages 76 to 47 with a history of 3 consecutive normal Pap tests.   Hepatitis C blood test.** / For any individual with known risks for hepatitis C.   Skin self-exam. / Monthly.   Influenza immunization.** / Every year.   Pneumococcal polysaccharide immunization.** / 1 to 2 doses if you smoke cigarettes or if you have certain chronic medical conditions.   Tetanus, diphtheria, pertussis (Tdap, Td) immunization. / A one-time dose of Tdap vaccine. After that, you need a Td booster dose every 10 years.   HPV immunization. / 3 doses over 6 months, if you are 31 and younger.   Measles, mumps, rubella (MMR) immunization. / You need at least 1 dose of MMR if you were born in 1957 or later. You may also need a second dose.   Meningococcal immunization. / 1 dose if you are age 5 to 67 and a first-year college student living in a residence hall, or have one of several medical conditions, you need to get vaccinated against meningococcal disease. You may also need additional booster doses.   Varicella immunization.** / Consult your caregiver.   Hepatitis A immunization.** / Consult your caregiver. 2 doses, 6 to 18 months apart.   Hepatitis B immunization.** / Consult your caregiver. 3 doses usually over 6 months.  Ages 63 to  74  Blood pressure check.** / Every 1 to 2 years.   Lipid and cholesterol check.** / Every 5 years beginning at age 39.   Clinical breast exam.** / Every  year after age 7.   Mammogram.** / Every year beginning at age 52 and continuing for as long as you are in good health. Consult with your caregiver.   Pap test.** / Every 3 years starting at age 48 through age 25 or 61 with a history of 3 consecutive normal Pap tests.   HPV screening.** / Every 3 years from ages 18 through ages 61 to 50 with a history of 3 consecutive normal Pap tests.   Fecal occult blood test (FOBT) of stool. / Every year beginning at age 58 and continuing until age 23. You may not need to do this test if you get a colonoscopy every 10 years.   Flexible sigmoidoscopy or colonoscopy.** / Every 5 years for a flexible sigmoidoscopy or every 10 years for a colonoscopy beginning at age 83 and continuing until age 36.   Hepatitis C blood test.** / For all people born from [birth year] through 1965 and any individual with known risks for hepatitis C.   Skin self-exam. / Monthly.   Influenza immunization.** / Every year.   Pneumococcal polysaccharide immunization.** / 1 to 2 doses if you smoke cigarettes or if you have certain chronic medical conditions.   Tetanus, diphtheria, pertussis (Tdap, Td) immunization.** / A one-time dose of Tdap vaccine. After that, you need a Td booster dose every 10 years.   Measles, mumps, rubella (MMR) immunization. / You need at least 1 dose of MMR if you were born in 1957 or later. You may also need a second dose.   Varicella immunization.** / Consult your caregiver.   Meningococcal immunization.** / Consult your caregiver.   Hepatitis A immunization.** / Consult your caregiver. 2 doses, 6 to 18 months apart.   Hepatitis B immunization.** / Consult your caregiver. 3 doses, usually over 6 months.  Ages 12 and over  Blood pressure check.** / Every 1 to 2 years.   Lipid and cholesterol  check.** / Every 5 years beginning at age 30.   Clinical breast exam.** / Every year after age 60.   Mammogram.** / Every year beginning at age 46 and continuing for as long as you are in good health. Consult with your caregiver.   Pap test.** / Every 3 years starting at age 72 through age 26 or 62 with a 3 consecutive normal Pap tests. Testing can be stopped between 65 and 70 with 3 consecutive normal Pap tests and no abnormal Pap or HPV tests in the past 10 years.   HPV screening.** / Every 3 years from ages 79 through ages 99 or 19 with a history of 3 consecutive normal Pap tests. Testing can be stopped between 65 and 70 with 3 consecutive normal Pap tests and no abnormal Pap or HPV tests in the past 10 years.   Fecal occult blood test (FOBT) of stool. / Every year beginning at age 85 and continuing until age 53. You may not need to do this test if you get a colonoscopy every 10 years.   Flexible sigmoidoscopy or colonoscopy.** / Every 5 years for a flexible sigmoidoscopy or every 10 years for a colonoscopy beginning at age 29 and continuing until age 57.   Hepatitis C blood test.** / For all people born from [birth year] through 1965 and any individual with known risks for hepatitis C.   Osteoporosis screening.** / A one-time screening for women ages 49 and over and women at risk for fractures or osteoporosis.   Skin self-exam. / Monthly.   Influenza immunization.** /  Every year.   Pneumococcal polysaccharide immunization.** / 1 dose at age 34 (or older) if you have never been vaccinated.   Tetanus, diphtheria, pertussis (Tdap, Td) immunization. / A one-time dose of Tdap vaccine if you are over 65 and have contact with an infant, are a Research scientist (physical sciences), or simply want to be protected from whooping cough. After that, you need a Td booster dose every 10 years.   Varicella immunization.** / Consult your caregiver.   Meningococcal immunization.** / Consult your caregiver.   Hepatitis A  immunization.** / Consult your caregiver. 2 doses, 6 to 18 months apart.   Hepatitis B immunization.** / Check with your caregiver. 3 doses, usually over 6 months.  ** Family history and personal history of risk and conditions may change your caregiver's recommendations. Document Released: 12/06/2001 Document Revised: 09/29/2011 Document Reviewed: 03/07/2011 [facility] Patient Information 2012 [place], [place].

## 2012-01-13 NOTE — Progress Notes (Signed)
Subjective:     Claire Hill is a 41 y.o. female and is here for a comprehensive physical exam. The patient reports problems - with eyes---seeing opht.  History   Social History  . Marital Status: Married    Spouse Name: N/A    Number of Children: N/A  . Years of Education: N/A   Occupational History  . Not on file.   Social History Main Topics  . Smoking status: Never Smoker   . Smokeless tobacco: Never Used  . Alcohol Use: No  . Drug Use: No  . Sexually Active: Not on file   Other Topics Concern  . Not on file   Social History Narrative  . No narrative on file   Health Maintenance  Topic Date Due  . Mammogram  05/19/1989  . Influenza Vaccine  07/24/2012  . Pap Smear  01/13/2015  . Tetanus/tdap  10/24/2020    The following portions of the patient's history were reviewed and updated as appropriate: allergies, current medications, past family history, past medical history, past social history, past surgical history and problem list.  Review of Systems Review of Systems  Constitutional: Negative for activity change, appetite change and fatigue.  HENT: Negative for hearing loss, congestion, tinnitus and ear discharge.  dentist q44m Eyes: Negative for visual disturbance (see optho q1y -- vision corrected to 20/20 with glasses).  Respiratory: Negative for cough, chest tightness and shortness of breath.   Cardiovascular: Negative for chest pain, palpitations and leg swelling.  Gastrointestinal: Negative for abdominal pain, diarrhea, constipation and abdominal distention.  Genitourinary: Negative for urgency, frequency, decreased urine volume and difficulty urinating.  Musculoskeletal: Negative for back pain, arthralgias and gait problem.  Skin: Negative for color change, pallor and rash.  Neurological: Negative for dizziness, light-headedness, numbness and headaches.  Hematological: Negative for adenopathy. Does not bruise/bleed easily.  Psychiatric/Behavioral:  Negative for suicidal ideas, confusion, sleep disturbance, self-injury, dysphoric mood, decreased concentration and agitation.       Objective:    BP 112/70  Pulse 97  Temp(Src) 98.7 F (37.1 C) (Oral)  Ht 5\' 6"  (1.676 m)  Wt 181 lb (82.101 kg)  BMI 29.21 kg/m2  SpO2 97% General appearance: alert, cooperative, appears stated age and no distress Head: Normocephalic, without obvious abnormality, atraumatic Eyes: negative findings: lids and lashes normal, conjunctivae and sclerae normal and pupils equal, round, reactive to light and accomodation, positive findings: L eye-- cloudy spot on cornea Ears: normal TM's and external ear canals both ears Nose: Nares normal. Septum midline. Mucosa normal. No drainage or sinus tenderness. Throat: lips, mucosa, and tongue normal; teeth and gums normal Neck: no adenopathy, no carotid bruit, no JVD, supple, symmetrical, trachea midline and thyroid not enlarged, symmetric, no tenderness/mass/nodules Back: symmetric, no curvature. ROM normal. No CVA tenderness. Lungs: clear to auscultation bilaterally Breasts: normal appearance, no masses or tenderness, R axilla-- + lump-prob seb cyst  Heart: S1, S2 normal Abdomen: soft, non-tender; bowel sounds normal; no masses,  no organomegaly Pelvic: cervix normal in appearance, external genitalia normal, no adnexal masses or tenderness, no cervical motion tenderness, rectovaginal septum normal, uterus normal size, shape, and consistency and vagina normal without discharge Extremities: extremities normal, atraumatic, no cyanosis or edema Pulses: 2+ and symmetric Skin: Skin color, texture, turgor normal. No rashes or lesions Lymph nodes: Cervical, supraclavicular, and axillary nodes normal. Neurologic: Alert and oriented X 3, normal strength and tone. Normal symmetric reflexes. Normal coordination and gait psych--  no depression, no anxiety    Assessment:  Healthy female exam.     decrease vision-- pt need  s to f/u opth Plan:    ghm utd Check labs See After Visit Summary for Counseling Recommendations

## 2012-02-26 ENCOUNTER — Other Ambulatory Visit (HOSPITAL_COMMUNITY)
Admission: RE | Admit: 2012-02-26 | Discharge: 2012-02-26 | Disposition: A | Discharge status: Home / Self Care | Payer: BC Managed Care – PPO | Source: Ambulatory Visit | Attending: Family Medicine | Admitting: Family Medicine

## 2012-03-30 ENCOUNTER — Ambulatory Visit (INDEPENDENT_AMBULATORY_CARE_PROVIDER_SITE_OTHER): Payer: BC Managed Care – PPO | Admitting: Family Medicine

## 2012-03-30 ENCOUNTER — Encounter: Payer: Self-pay | Admitting: Family Medicine

## 2012-03-30 ENCOUNTER — Ambulatory Visit: Payer: BC Managed Care – PPO | Admitting: Family Medicine

## 2012-03-30 VITALS — BP 110/86 | HR 93 | Temp 98.4°F | Wt 178.0 lb

## 2012-03-30 DIAGNOSIS — L0291 Cutaneous abscess, unspecified: Secondary | ICD-10-CM

## 2012-03-30 DIAGNOSIS — L0390 Cellulitis, unspecified: Secondary | ICD-10-CM

## 2012-03-30 DIAGNOSIS — E785 Hyperlipidemia, unspecified: Secondary | ICD-10-CM

## 2012-03-30 DIAGNOSIS — N390 Urinary tract infection, site not specified: Secondary | ICD-10-CM

## 2012-03-30 DIAGNOSIS — R739 Hyperglycemia, unspecified: Secondary | ICD-10-CM

## 2012-03-30 DIAGNOSIS — R7309 Other abnormal glucose: Secondary | ICD-10-CM

## 2012-03-30 LAB — HEPATIC FUNCTION PANEL
ALT: 57 U/L — ABNORMAL HIGH (ref 0–35)
AST: 36 U/L (ref 0–37)
Albumin: 4.1 g/dL (ref 3.5–5.2)
Alkaline Phosphatase: 89 U/L (ref 39–117)
Bilirubin, Direct: 0 mg/dL (ref 0.0–0.3)
Total Bilirubin: 0.4 mg/dL (ref 0.3–1.2)
Total Protein: 7.2 g/dL (ref 6.0–8.3)

## 2012-03-30 LAB — BASIC METABOLIC PANEL
Calcium: 9.4 mg/dL (ref 8.4–10.5)
Creatinine, Ser: 0.6 mg/dL (ref 0.4–1.2)
GFR: 108.76 mL/min (ref >60.00)
Potassium: 3.5 mEq/L (ref 3.5–5.1)
Sodium: 138 mEq/L (ref 135–145)

## 2012-03-30 LAB — POCT URINALYSIS DIPSTICK
Nitrite, UA: NEGATIVE
Urobilinogen, UA: 0.2
pH, UA: 6

## 2012-03-30 LAB — LDL CHOLESTEROL, DIRECT: Direct LDL: 173.1 mg/dL

## 2012-03-30 LAB — HEMOGLOBIN A1C: Hgb A1c MFr Bld: 9.2 % — ABNORMAL HIGH (ref 4.6–6.5)

## 2012-03-30 LAB — LIPID PANEL
Total CHOL/HDL Ratio: 4
Triglycerides: 211 mg/dL — ABNORMAL HIGH (ref 0.0–149.0)

## 2012-03-30 MED ORDER — CEPHALEXIN 500 MG PO CAPS: 500.0000 mg | ORAL_CAPSULE | Freq: Four times a day (QID) | ORAL | Status: AC

## 2012-03-30 NOTE — Progress Notes (Signed)
  Subjective:    Patient ID: Claire Hill, female    DOB: September 17, 1971, 41 y.o.   MRN: [id number]  HPI Pt here to have labs repeated.  She also still has abscess in R axilla and she would like it removed.  It is tender and gets bigger then gets smaller.    No other compaints.   Review of Systems As above    Objective:   Physical Exam  Constitutional: She is oriented to person, place, and time. She appears well-developed and well-nourished.  Cardiovascular: Normal rate, regular rhythm and normal heart sounds.   Pulmonary/Chest: Effort normal and breath sounds normal. No respiratory distress. She has no wheezes. She has no rales. She exhibits no tenderness.  Neurological: She is alert and oriented to person, place, and time.  Skin: Skin is warm.       R axilla-- + abscess  Small, tender to touch, not draining,  + errythema  Psychiatric: She has a normal mood and affect. Her behavior is normal. Judgment and thought content normal.          Assessment & Plan:

## 2012-03-30 NOTE — Assessment & Plan Note (Signed)
Check labs 

## 2012-03-30 NOTE — Assessment & Plan Note (Signed)
Keflex  Warm compresses Surgery referral

## 2012-03-30 NOTE — Assessment & Plan Note (Signed)
D/w pt diet and exercise Check labs

## 2012-03-30 NOTE — Patient Instructions (Addendum)
Hyperglycemia Hyperglycemia occurs when the glucose (sugar) in your blood is too high. Hyperglycemia can happen for many reasons, but it most often happens to people who do not know they have diabetes or are not managing their diabetes properly.  CAUSES  Whether you have diabetes or not, there are other causes of hyperglycemia. Hyperglycemia can occur when you have diabetes, but it can also occur in other situations that you might not be as aware of, such as: Diabetes  If you have diabetes and are having problems controlling your blood glucose, hyperglycemia could occur because of some of the following reasons:   Not following your meal plan.   Not taking your diabetes medications or not taking it properly.   Exercising less or doing less activity than you normally do.   Being sick.  Pre-diabetes  This cannot be ignored. Before people develop Type 2 diabetes, they almost always have "pre-diabetes." This is when your blood glucose levels are higher than normal, but not yet high enough to be diagnosed as diabetes. Research has shown that some long-term damage to the body, especially the heart and circulatory system, may already be occurring during pre-diabetes. If you take action to manage your blood glucose when you have pre-diabetes, you may delay or prevent Type 2 diabetes from developing.  Stress  If you have diabetes, you may be "diet" controlled or on oral medications or insulin to control your diabetes. However, you may find that your blood glucose is higher than usual in the hospital whether you have diabetes or not. This is often referred to as "stress hyperglycemia." Stress can elevate your blood glucose. This happens because of hormones put out by the body during times of stress. If stress has been the cause of your high blood glucose, it can be followed regularly by your caregiver. That way he/she can make sure your hyperglycemia does not continue to get worse or progress to diabetes.    Steroids  Steroids are medications that act on the infection fighting system (immune system) to block inflammation or infection. One side effect can be a rise in blood glucose. Most people can produce enough extra insulin to allow for this rise, but for those who cannot, steroids make blood glucose levels go even higher. It is not unusual for steroid treatments to "uncover" diabetes that is developing. It is not always possible to determine if the hyperglycemia will go away after the steroids are stopped. A special blood test called an A1c is sometimes done to determine if your blood glucose was elevated before the steroids were started.  SYMPTOMS  Thirsty.   Frequent urination.   Dry mouth.   Blurred vision.   Tired or fatigue.   Weakness.   Sleepy.   Tingling in feet or leg.  DIAGNOSIS  Diagnosis is made by monitoring blood glucose in one or all of the following ways:  A1c test. This is a chemical found in your blood.   Fingerstick blood glucose monitoring.   Laboratory results.  TREATMENT  First, knowing the cause of the hyperglycemia is important before the hyperglycemia can be treated. Treatment may include, but is not be limited to:  Education.   Change or adjustment in medications.   Change or adjustment in meal plan.   Treatment for an illness, infection, etc.   More frequent blood glucose monitoring.   Change in exercise plan.   Decreasing or stopping steroids.   Lifestyle changes.  HOME CARE INSTRUCTIONS   Test your blood glucose  as directed.   Exercise regularly. Your caregiver will give you instructions about exercise. Pre-diabetes or diabetes which comes on with stress is helped by exercising.   Eat wholesome, balanced meals. Eat often and at regular, fixed times. Your caregiver or nutritionist will give you a meal plan to guide your sugar intake.   Being at an ideal weight is important. If needed, losing as little as 10 to 15 pounds may help  improve blood glucose levels.  SEEK MEDICAL CARE IF:   You have questions about medicine, activity, or diet.   You continue to have symptoms (problems such as increased thirst, urination, or weight gain).  SEEK IMMEDIATE MEDICAL CARE IF:   You are vomiting or have diarrhea.   Your breath smells fruity.   You are breathing faster or slower.   You are very sleepy or incoherent.   You have numbness, tingling, or pain in your feet or hands.   You have chest pain.   Your symptoms get worse even though you have been following your caregiver's orders.   If you have any other questions or concerns.  Document Released: 04/05/2001 Document Revised: 09/29/2011 Document Reviewed: 06/01/2009 [facility] Patient Information 2012 [place], [place].  Cholesterol Control Diet Cholesterol levels in your body are determined significantly by your diet. Cholesterol levels may also be related to heart disease. The following material helps to explain this relationship and discusses what you can do to help keep your heart healthy. Not all cholesterol is bad. Low-density lipoprotein (LDL) cholesterol is the "bad" cholesterol. It may cause fatty deposits to build up inside your arteries. High-density lipoprotein (HDL) cholesterol is "good." It helps to remove the "bad" LDL cholesterol from your blood. Cholesterol is a very important risk factor for heart disease. Other risk factors are high blood pressure, smoking, stress, heredity, and weight. The heart muscle gets its supply of blood through the coronary arteries. If your LDL cholesterol is high and your HDL cholesterol is low, you are at risk for having fatty deposits build up in your coronary arteries. This leaves less room through which blood can flow. Without sufficient blood and oxygen, the heart muscle cannot function properly and you may feel chest pains (angina pectoris). When a coronary artery closes up entirely, a part of the heart muscle may die, causing a  heart attack (myocardial infarction). CHECKING CHOLESTEROL When your caregiver sends your blood to a lab to be analyzed for cholesterol, a complete lipid (fat) profile may be done. With this test, the total amount of cholesterol and levels of LDL and HDL are determined. Triglycerides are a type of fat that circulates in the blood and can also be used to determine heart disease risk. The list below describes what the numbers should be: Test: Total Cholesterol.  Less than 200 mg/dl.  Test: LDL "bad cholesterol."  Less than 100 mg/dl.   Less than 70 mg/dl if you are at very high risk of a heart attack or sudden cardiac death.  Test: HDL "good cholesterol."  Greater than 50 mg/dl for women.   Greater than 40 mg/dl for men.  Test: Triglycerides.  Less than 150 mg/dl.  CONTROLLING CHOLESTEROL WITH DIET Although exercise and lifestyle factors are important, your diet is key. That is because certain foods are known to raise cholesterol and others to lower it. The goal is to balance foods for their effect on cholesterol and more importantly, to replace saturated and trans fat with other types of fat, such as monounsaturated fat, polyunsaturated  fat, and omega-3 fatty acids. On average, a person should consume no more than 15 to 17 g of saturated fat daily. Saturated and trans fats are considered "bad" fats, and they will raise LDL cholesterol. Saturated fats are primarily found in animal products such as meats, butter, and cream. However, that does not mean you need to sacrifice all your favorite foods. Today, there are good tasting, low-fat, low-cholesterol substitutes for most of the things you like to eat. Choose low-fat or nonfat alternatives. Choose round or loin cuts of red meat, since these types of cuts are lowest in fat and cholesterol. Chicken (without the skin), fish, veal, and ground [place] breast are excellent choices. Eliminate fatty meats, such as hot dogs and salami. Even shellfish have  little or no saturated fat. Have a 3 oz (85 g) portion when you eat lean meat, poultry, or fish. Trans fats are also called "partially hydrogenated oils." They are oils that have been scientifically manipulated so that they are solid at room temperature resulting in a longer shelf life and improved taste and texture of foods in which they are added. Trans fats are found in stick margarine, some tub margarines, cookies, crackers, and baked goods.  When baking and cooking, oils are an excellent substitute for butter. The monounsaturated oils are especially beneficial since it is believed they lower LDL and raise HDL. The oils you should avoid entirely are saturated tropical oils, such as coconut and palm.  Remember to eat liberally from food groups that are naturally free of saturated and trans fat, including fish, fruit, vegetables, beans, grains (barley, rice, couscous, bulgur wheat), and pasta (without cream sauces).  IDENTIFYING FOODS THAT LOWER CHOLESTEROL  Soluble fiber may lower your cholesterol. This type of fiber is found in fruits such as apples, vegetables such as broccoli, potatoes, and carrots, legumes such as beans, peas, and lentils, and grains such as barley. Foods fortified with plant sterols (phytosterol) may also lower cholesterol. You should eat at least 2 g per day of these foods for a cholesterol lowering effect.  Read package labels to identify low-saturated fats, trans fats free, and low-fat foods at the supermarket. Select cheeses that have only 2 to 3 g saturated fat per ounce. Use a heart-healthy tub margarine that is free of trans fats or partially hydrogenated oil. When buying baked goods (cookies, crackers), avoid partially hydrogenated oils. Breads and muffins should be made from whole grains (whole-wheat or whole oat flour, instead of "flour" or "enriched flour"). Buy non-creamy canned soups with reduced salt and no added fats.  FOOD PREPARATION TECHNIQUES  Never deep-fry. If  you must fry, either stir-fry, which uses very little fat, or use non-stick cooking sprays. When possible, broil, bake, or roast meats, and steam vegetables. Instead of dressing vegetables with butter or margarine, use lemon and herbs, applesauce and cinnamon (for squash and sweet potatoes), nonfat yogurt, salsa, and low-fat dressings for salads.  LOW-SATURATED FAT / LOW-FAT FOOD SUBSTITUTES Meats / Saturated Fat (g)  Avoid: Steak, marbled (3 oz/85 g) / 11 g   Choose: Steak, lean (3 oz/85 g) / 4 g   Avoid: Hamburger (3 oz/85 g) / 7 g   Choose: Hamburger, lean (3 oz/85 g) / 5 g   Avoid: Ham (3 oz/85 g) / 6 g   Choose: Ham, lean cut (3 oz/85 g) / 2.4 g   Avoid: Chicken, with skin, dark meat (3 oz/85 g) / 4 g   Choose: Chicken, skin removed, dark meat (3  oz/85 g) / 2 g   Avoid: Chicken, with skin, light meat (3 oz/85 g) / 2.5 g   Choose: Chicken, skin removed, light meat (3 oz/85 g) / 1 g  Dairy / Saturated Fat (g)  Avoid: Whole milk (1 cup) / 5 g   Choose: Low-fat milk, 2% (1 cup) / 3 g   Choose: Low-fat milk, 1% (1 cup) / 1.5 g   Choose: Skim milk (1 cup) / 0.3 g   Avoid: Hard cheese (1 oz/28 g) / 6 g   Choose: Skim milk cheese (1 oz/28 g) / 2 to 3 g   Avoid: Cottage cheese, 4% fat (1 cup) / 6.5 g   Choose: Low-fat cottage cheese, 1% fat (1 cup) / 1.5 g   Avoid: Ice cream (1 cup) / 9 g   Choose: Sherbet (1 cup) / 2.5 g   Choose: Nonfat frozen yogurt (1 cup) / 0.3 g   Choose: Frozen fruit bar / trace   Avoid: Whipped cream (1 tbs) / 3.5 g   Choose: Nondairy whipped topping (1 tbs) / 1 g  Condiments / Saturated Fat (g)  Avoid: Mayonnaise (1 tbs) / 2 g   Choose: Low-fat mayonnaise (1 tbs) / 1 g   Avoid: Butter (1 tbs) / 7 g   Choose: Extra light margarine (1 tbs) / 1 g   Avoid: Coconut oil (1 tbs) / 11.8 g   Choose: Olive oil (1 tbs) / 1.8 g   Choose: Corn oil (1 tbs) / 1.7 g   Choose: Safflower oil (1 tbs) / 1.2 g   Choose: Sunflower oil (1 tbs) /  1.4 g   Choose: Soybean oil (1 tbs) / 2.4 g   Choose: Canola oil (1 tbs) / 1 g  Document Released: 10/10/2005 Document Revised: 09/29/2011 Document Reviewed: 03/31/2011 [facility] Patient Information 2012 [place], [place].

## 2012-04-02 LAB — URINE CULTURE: Colony Count: 45000

## 2012-04-11 MED ORDER — METFORMIN HCL ER 500 MG PO TB24: 500.0000 mg | ORAL_TABLET | Freq: Every day | ORAL | Status: DC

## 2012-04-11 MED ORDER — ATORVASTATIN CALCIUM 20 MG PO TABS: 20.0000 mg | ORAL_TABLET | Freq: Every day | ORAL | Status: DC

## 2012-04-12 ENCOUNTER — Ambulatory Visit (INDEPENDENT_AMBULATORY_CARE_PROVIDER_SITE_OTHER): Payer: BC Managed Care – PPO | Admitting: Family Medicine

## 2012-04-12 DIAGNOSIS — E119 Type 2 diabetes mellitus without complications: Secondary | ICD-10-CM

## 2012-04-12 MED ORDER — ONETOUCH DELICA LANCETS MISC: Status: DC

## 2012-04-12 MED ORDER — GLUCOSE BLOOD VI STRP: ORAL_STRIP | Status: DC

## 2012-04-12 NOTE — Patient Instructions (Addendum)
Monitoreo de Banker, adulto (Blood Sugar Monitoring, Adult) MEDIDORES DE GLUCOSA PARA EL AUTOCONTROL DE LA GLUCOSA EN SANGRE Para toda persona diabtica es importante poder medir correctamente su nivel de glucosa en sangre. Puede usar Occupational hygienist (un pequeo dispositivo operado a batera) para controlar su nivel de glucosa en cualquier momento. Esto le permite usted y a su mdico Chief Operating Officer su diabetes y Production assistant, radio efectividad del plan de tratamiento. El proceso de medicin de la propia glucosa en sangre con un medidor se denomina autocontrol de la glucosa en sangre. Cuando las personas diabticas se Development worker, community en sangre (glucosa), su salud mejora. Para controlar la glucosa con un medidor clsico, coloque una tira reactiva desechable. Luego coloque una gota de sangre en la tira reactiva. Coloque la Environmental consultant. Las tiras de prueba estn cubiertas con qumicos que se combinan con la glucosa de la [place]. El medidor indica la cantidad actual de glucosa. Este medidor muestra el nivel de glucosa en nmeros. Varios modelos nuevos pueden grabar y Academic librarian cierta cantidad de resultados de [place]. Algunos modelos pueden conectarse a ordenadores personales para [employer] de las pruebas o imprimirlas.  Los nuevos glucmetros con frecuencia son ms fciles de usar que los ms antiguos. Algunos aparatos permiten extraer sangre de otras zonas adems de la yema del dedo. Algunos modelos nuevos poseen temporizador automtico, cdigos de error, lectores por seales o barra de cdigos para ayudar al ajuste correcto (calibracin). Otros tienen una pantalla grande o instrucciones habladas para las personas con dficits visuales.  INSTRUCCIONES PARA EL USO DEL MEDIDOR DE GLUCOSA  Lvese en las manos con agua y jabn o limpie la zonas con alcohol. Seque bien sus manos.   Pinche un lado de la yema del dedo con una lanceta (un pequeo instrumento manual con punta afilada).     Baje la mano y sostenga el dedo hasta que aparezca una pequea gota de [place]. Coloque la [employer].   Siga las instrucciones para insertar la tirilla y Lexicographer. El medidor debe encenderse y luego hay que insertar la tira reactiva antes de aplicar la [place].   Anote el resultado de la prueba.   Debe leer cuidadosamente las indicaciones tanto para el uso del medidor como de las tirillas. Las instrucciones para el uso del medidor se encuentran en el manual del usuario. Conserve el manual ya que podr ayudarlo a solucionar algunos problemas que puedan surgir. Muchos medidores usan "cdigos de error" cuando hay un problema con el aparato, la tirilla o la [place]. Necesitar el manual para interpretar estos cdigos de error y Product/process development scientist.   Nuevos dispositivos estn a la venta, Chief Financial Officer y medidores que pueden Education officer, environmental la prueba de sangre tomndola de "sitios alternativos" del organismo, [place] de los dedos. Sin embargo utilice pruebas estndar si su nivel de glucosa cambia rpidamente. Tambin use una prueba estndar si:   Ha comido, ha practicado actividad fsica o ha tomado insulina en las ltimas 2 horas.   Piensa que su nivel de glucosa es bajo.   Tiene tendencia a no sentir los sntomas de bajo nivel de glucosa (hipoglucemia).   Est enfermo o bajo una situacin de estrs.   Limpie el glucmetro segn las indicaciones del fabricante.   Pruebe el glucmetro segn las indicaciones del fabricante.   Lleve el medidor en su visita al consultorio del mdico. De este modo podr probar el medidor de glucosa  mientras el profesional verifica su tcnica para Chief Financial Officer medicin y asegurarse que est utilizando el medidor correctamente. El mdico tambin puede tomar [place] de sangre usando un mtodo de laboratorio de [place]. Si los valores del medidor de glucosa coinciden con los del laboratorio, usted y Mining engineer podrn  comprobar que el medidor funciona bien y que est usando la Careers information officer. El mdico indicar qu hacer si los resultados no coinciden.  FRECUENCIA DE LA PRUEBA El mdico le aconsejara con qu frecuencia debe controlar su nivel de glucosa en sangre. Esto depender de su tipo de diabetes, como su nivel actual de control de diabetes y el tipo de medicamento que toma. A continuacin se indican pautas generales, pero su plan personal puede ser diferente. Registre [employer] y el momento del da para que su mdico pueda revisarlos.   Diabetes tipo 1.   Cuando [place] insulina con un buen control de la diabetes (ya sea con mltiples inyecciones diarias o a travs de una bomba), debe controlar su nivel de glucosa 4 veces por da.   Si la diabetes no puede controlarse adecuadamente, ser necesario un control ms frecuente, por ejemplo antes de las comidas y [place] horas despus de las mismas, en el momento de irse a dormir y [employer] 2 y las 3 a.m.   Debe controlar siempre su nivel de glucosa antes de recibir una dosis de insulina o antes de modificar la proporcin en la bomba de insulina.   Diabetes tipo 2.   Las pautas para el auto control del nivel de glucosa en sangre en la diabetes tipo 2 no estn bien definidas.   Si recibe insulina, siga las pautas ya indicadas.   Si toma medicamentos pero no recibe insulina, y su nivel de glucosa no est bien controlado, debe verificarlos al [facility] por da.   Si no recibe insulina y su diabetes est controlada slo con medicamentos o dieta, debe verificar el nivel de glucosa en sangre al menos una vez por da, generalmente antes del desayuno.   Un perfil semanal podr ser de utilidad para que su mdico lo aconseje acerca de su plan de cuidados. La semana anterior a su visita, controle su glucosa antes de las comidas, y 2 horas despus de las mismas diariamente. Puede realizar la prueba antes y despus de las distintas comidas de cada da  para que usted y el mdico puedan ver si los niveles de International aid/development worker en sangre estn controlados en un perodo de 24 horas.   Diabetes gestacional.   Es necesario repetir la prueba con frecuencia. Es importante la precisin en el momento en que la realiza.   Si no recibe insulina, controle su nivel de glucosa 4 veces por da. antes del desayuno y 1 hora despus del inicio de cada comida.   Si utiliza insulina, controle su nivel de glucosa 6 veces por da. antes del desayuno y 1 hora despus del inicio de cada comida.   Normativas generales.   En el inicio de un tratamiento es necesario realizar controles ms frecuentes. El mdico lo asesorar.   Mida su nivel de glucosa en cada momento en que sospeche que tiene un bajo nivel de Production assistant, radio (hipoglucemia).   Deber controlarse con ms frecuencia cuando [employer], cuando pase por alguna situacin de estrs que no sea habitual, en caso de enfermedad, o en otras circunstancias poco frecuentes.  OTRAS COSAS QUE DEBE SABER ACERCA DE LOS GLUCMETROS  Amplitud de medida:  La mayor parte de los medidores de glucosa pueden mostrar niveles comprendidos en un amplio margen de [place], desde cifras tan bajas como 0, hasta niveles tan elevados como 600 mg/dL. Si el [employer] niveles muy elevados o muy bajos, deber confirmarlos con otra medicin. Informe a su mdico cuando los valores sean demasiado elevados o demasiado bajos.   Niveles de glucosa en sangre completa vs. niveles de glucosa en plasma: Algunos dispositivos caseros antiguos medan la glucosa en toda la sangre. Cuando se hace en el laboratorio, o con algunos medidores nuevos, la glucosa se mide en el plasma (un componente de la [place]). La diferencia puede ser importante. Es importante que usted y Mining engineer que lo asiste sepan si su medidor proporciona [employer] como "equivalente de sangre completa" o "equivalente de plasma".   Visualizacin de niveles elevados y  bajos de glucosa: Parte del aprendizaje del manejo del medidor es el conocimiento del significado de [place]. Asegrese de Solicitor las concentraciones ms elevadas y ms bajas que el medidor indica.   Factores que afectan el rendimiento del Office manager. La precisin de [employer] de las pruebas dependen de muchos factores y varan segn la marca y el tipo de medidor. Aqu se incluyen:   Bajo recuento de glbulos rojos (anemia).   Sustancias presentes en la sangre (cido rico, vitamina C y otros).   Factores ambientales (temperatura, humedad, altitud).   Tiras reactivas de marca versus tirar reactivas genricas.   Calibracin. Asegrese que su glucmetro est correctamente calibrado. Es Neomia Dear buena idea hacer una prueba de calibracin con la solucin de control recomendada por el fabricante, cada vez que comience a usar un envase nuevo de tiras reactivas. Esto ayuda a verificar la precisin del medidor.   Tiras reactivas mal almacenadas, vencidas o defectuosas. Mantenga las tiras Writer seco y bien tapadas.   Medidor daado.   Muestra de [employer]  NUEVAS TECNOLOGAS PARA LA MEDICIN DE LA GLUCOSA Lugares alternativos para tomar la muestra Algunos glucmetros permiten tomar muestras en sitios alternativos. Por ejemplo:  [place].   Antebrazo.   Base del pulgar.   Muslos.  Puede ser necesario tomar muestras en sitios alternativos. Sin embargo, esto puede tener algunas limitaciones. La sangre que se obtiene de la yema de los dedos puede mostrar modificaciones en los niveles de glucosa ms rpidamente que la sangre que proviene de otras partes del organismo. Esto significa que los resultados de las pruebas de lugares alternativos pueden ser diferentes de los de la yema del dedo debido a que la concentracin presente de glucosa puede ser diferente y no por la capacidad del medidor para Printmaker prueba con precisin.  Control continuo de la glucosa en  sangre Ya se dispone de algunos dispositivos para medir de Wellsite geologist continua la glucosa en sangre y otros estn en desarrollo. Estos mtodos pueden ser ms caros que el autocontrol con Occupational hygienist. Sin embargo, su efectividad y confiabilidad es incierta . El Brewing technologist acerca de adoptar o no este mtodo. CONTROLNDO SU NIVEL DE GLUCOSA EN SANGRE, LAS PERSONAS DIABTICAS TENDRN MS SALUD  El autocontrol de la glucosa es una parte importante dentro del plan de tratamiento de los pacientes que sufren diabetes mellitus. A continuacin se indican algunos motivos para realizar el autocontrol de glucosa en sangre:   Puede confirmar que su nivel de glucosa reencuentra en un nivel especfico, saludable.   Detecta hipoglucemia e hiperglucemia grave.   Le permite a usted y al mdico  hacer ajustes en respuesta a los cambios en el estilo de vida en aquellas personas que requieren medicamentos.   Determina la necesidad de comenzar una terapia con insulina en la diabetes pasajera que se produce durante el embarazo (diabetes mellitus gestacional).  Document Released: 10/10/2005 Document Revised: 09/29/2011 [facility] Patient Information 2012 [place], [place].

## 2012-04-12 NOTE — Progress Notes (Signed)
  Subjective:    Patient ID: Claire Hill, female    DOB: 07-16-1971, 41 y.o.   MRN: [id number]  HPI Pt trained on glucometer by cma   Review of Systems     Objective:   Physical Exam        Assessment & Plan:

## 2012-04-24 ENCOUNTER — Ambulatory Visit (INDEPENDENT_AMBULATORY_CARE_PROVIDER_SITE_OTHER): Payer: BC Managed Care – PPO | Admitting: Surgery

## 2012-04-24 ENCOUNTER — Encounter (INDEPENDENT_AMBULATORY_CARE_PROVIDER_SITE_OTHER): Payer: Self-pay | Admitting: Surgery

## 2012-04-24 VITALS — BP 118/78 | HR 45 | Temp 98.0°F | Resp 16 | Ht 68.0 in | Wt 175.6 lb

## 2012-04-24 DIAGNOSIS — L723 Sebaceous cyst: Secondary | ICD-10-CM

## 2012-04-24 DIAGNOSIS — L089 Local infection of the skin and subcutaneous tissue, unspecified: Secondary | ICD-10-CM

## 2012-04-24 NOTE — Progress Notes (Signed)
Patient ID: Claire Hill, female   DOB: January 06, 1971, 41 y.o.   MRN: [id number]  Chief Complaint  Patient presents with  . Other    axillary abscess    HPI Claire Hill is a 41 y.o. female.  This patient is referred by Dr. Laury Axon for evaluation of a chronic cyst in her right axilla. It drained several weeks ago and had an incision and drainage. It resolved with antibiotics. She is still had discomfort since then. She has no previous history of infected sebaceous cysts. She is otherwise without complaints. HPI  Past Medical History  Diagnosis Date  . Herpes simplex of eye   . OCD (obsessive compulsive disorder)   . Hyperlipidemia   . Anemia   . Asthma   . Diabetes mellitus     History reviewed. No pertinent past surgical history.  Family History  Problem Relation Age of Onset  . Diabetes Father     Social History History  Substance Use Topics  . Smoking status: Never Smoker   . Smokeless tobacco: Never Used  . Alcohol Use: No    No Known Allergies  Current Outpatient Prescriptions  Medication Sig Dispense Refill  . acyclovir (ZOVIRAX) 400 MG tablet Take 800 mg by mouth 2 (two) times daily.        Marland Kitchen atorvastatin (LIPITOR) 20 MG tablet Take 1 tablet (20 mg total) by mouth daily.  30 tablet  2  . glucose blood (ONE TOUCH ULTRA TEST) test strip Test twice a day  100 each  2  . metFORMIN (GLUCOPHAGE XR) 500 MG 24 hr tablet Take 1 tablet (500 mg total) by mouth daily with breakfast.  30 tablet  2  . naproxen sodium (ANAPROX) 220 MG tablet Take 220 mg by mouth 2 (two) times daily with a meal.      . ONETOUCH DELICA LANCETS MISC Test twice a day  100 each  2    Review of Systems Review of Systems  Constitutional: Negative for fever, chills and unexpected weight change.  HENT: Negative for hearing loss, congestion, sore throat, trouble swallowing and voice change.   Eyes: Negative for visual disturbance.  Respiratory: Negative for cough and wheezing.     Cardiovascular: Negative for chest pain, palpitations and leg swelling.  Gastrointestinal: Negative for nausea, vomiting, abdominal pain, diarrhea, constipation, blood in stool, abdominal distention and anal bleeding.  Genitourinary: Negative for hematuria, vaginal bleeding and difficulty urinating.  Musculoskeletal: Negative for arthralgias.  Skin: Negative for rash and wound.  Neurological: Negative for seizures, syncope and headaches.  Hematological: Negative for adenopathy. Does not bruise/bleed easily.  Psychiatric/Behavioral: Negative for confusion.    Blood pressure 118/78, pulse 45, temperature 98 F (36.7 C), temperature source Temporal, resp. rate 16, height 5\' 8"  (1.727 m), weight 175 lb 9.6 oz (79.652 kg).  Physical Exam Physical Exam  Constitutional: She is oriented to person, place, and time. She appears well-developed and well-nourished. No distress.  HENT:  Head: Normocephalic and atraumatic.  Right Ear: External ear normal.  Left Ear: External ear normal.  Nose: Nose normal.  Mouth/Throat: Oropharynx is clear and moist.  Eyes: Conjunctivae and EOM are normal. Pupils are equal, round, and reactive to light. Right eye exhibits no discharge. Left eye exhibits no discharge. No scleral icterus.  Neck: Normal range of motion. Neck supple. No tracheal deviation present. No thyromegaly present.  Cardiovascular: Normal rate, regular rhythm, normal heart sounds and intact distal pulses.   No murmur heard. Pulmonary/Chest: Effort normal. No  respiratory distress. She has no wheezes. She has no rales.  Lymphadenopathy:    She has no cervical adenopathy.  Neurological: She is alert and oriented to person, place, and time.  Skin: Skin is warm and dry. No rash noted. She is not diaphoretic. No erythema.       There is a small cyst underneath a well-healed incision in the right axilla. There is no evidence of current infection  Psychiatric: Her behavior is normal.    Data  Reviewed   Assessment    Chronically infected sebaceous cyst right axilla    Plan    Removal of this is recommended to prevent further infection. I discussed this in detail with the patient and her husband. I discussed the risk which includes but is not limited to bleeding, infection, recurrence, etc. They understand and wish to proceed. Surgery will be scheduled. Likelihood of success is good       Rosan Calbert A 04/24/2012, 11:14 AM

## 2012-06-18 ENCOUNTER — Encounter (HOSPITAL_BASED_OUTPATIENT_CLINIC_OR_DEPARTMENT_OTHER): Payer: Self-pay | Admitting: *Deleted

## 2012-06-18 NOTE — Progress Notes (Signed)
Husband called back-pt with him-reviewed hx-says she speaks english and does not need interpretor-her cell phone message was in spanish To come in for bmet.

## 2012-06-19 ENCOUNTER — Encounter (HOSPITAL_BASED_OUTPATIENT_CLINIC_OR_DEPARTMENT_OTHER)
Admission: RE | Admit: 2012-06-19 | Discharge: 2012-06-19 | Disposition: A | Discharge status: Home / Self Care | Payer: BC Managed Care – PPO | Source: Ambulatory Visit | Attending: Surgery | Admitting: Surgery

## 2012-06-19 ENCOUNTER — Telehealth (INDEPENDENT_AMBULATORY_CARE_PROVIDER_SITE_OTHER): Payer: Self-pay | Admitting: General Surgery

## 2012-06-19 LAB — BASIC METABOLIC PANEL
CO2: 29 mEq/L (ref 19–32)
Chloride: 103 mEq/L (ref 96–112)
Glucose, Bld: 191 mg/dL — ABNORMAL HIGH (ref 70–99)
Potassium: 4.6 mEq/L (ref 3.5–5.1)
Sodium: 138 mEq/L (ref 135–145)

## 2012-06-19 NOTE — Telephone Encounter (Signed)
Husband called to make sure we are aware that his wife has diabetes. I made him aware it is in her chart and they do check the levels prior to surgery. He will call with any other concerns.

## 2012-06-21 ENCOUNTER — Encounter (HOSPITAL_BASED_OUTPATIENT_CLINIC_OR_DEPARTMENT_OTHER): Payer: Self-pay | Admitting: *Deleted

## 2012-06-21 NOTE — H&P (Signed)
Chief Complaint   Patient presents with   .  Other     axillary abscess    HPI  Claire Hill is a 41 y.o. female. This patient is referred by Dr. Laury Axon for evaluation of a chronic cyst in her right axilla. It drained several weeks ago and had an incision and drainage. It resolved with antibiotics. She is still had discomfort since then. She has no previous history of infected sebaceous cysts. She is otherwise without complaints.  HPI  Past Medical History   Diagnosis  Date   .  Herpes simplex of eye    .  OCD (obsessive compulsive disorder)    .  Hyperlipidemia    .  Anemia    .  Asthma    .  Diabetes mellitus     History reviewed. No pertinent past surgical history.  Family History   Problem  Relation  Age of Onset   .  Diabetes  Father     Social History  History   Substance Use Topics   .  Smoking status:  Never Smoker   .  Smokeless tobacco:  Never Used   .  Alcohol Use:  No    No Known Allergies  Current Outpatient Prescriptions   Medication  Sig  Dispense  Refill   .  acyclovir (ZOVIRAX) 400 MG tablet  Take 800 mg by mouth 2 (two) times daily.     Marland Kitchen  atorvastatin (LIPITOR) 20 MG tablet  Take 1 tablet (20 mg total) by mouth daily.  30 tablet  2   .  glucose blood (ONE TOUCH ULTRA TEST) test strip  Test twice a day  100 each  2   .  metFORMIN (GLUCOPHAGE XR) 500 MG 24 hr tablet  Take 1 tablet (500 mg total) by mouth daily with breakfast.  30 tablet  2   .  naproxen sodium (ANAPROX) 220 MG tablet  Take 220 mg by mouth 2 (two) times daily with a meal.     .  ONETOUCH DELICA LANCETS MISC  Test twice a day  100 each  2    Review of Systems  Review of Systems  Constitutional: Negative for fever, chills and unexpected weight change.  HENT: Negative for hearing loss, congestion, sore throat, trouble swallowing and voice change.  Eyes: Negative for visual disturbance.  Respiratory: Negative for cough and wheezing.  Cardiovascular: Negative for chest pain,  palpitations and leg swelling.  Gastrointestinal: Negative for nausea, vomiting, abdominal pain, diarrhea, constipation, blood in stool, abdominal distention and anal bleeding.  Genitourinary: Negative for hematuria, vaginal bleeding and difficulty urinating.  Musculoskeletal: Negative for arthralgias.  Skin: Negative for rash and wound.  Neurological: Negative for seizures, syncope and headaches.  Hematological: Negative for adenopathy. Does not bruise/bleed easily.  Psychiatric/Behavioral: Negative for confusion.   Blood pressure 118/78, pulse 45, temperature 98 F (36.7 C), temperature source Temporal, resp. rate 16, height 5\' 8"  (1.727 m), weight 175 lb 9.6 oz (79.652 kg).  Physical Exam  Physical Exam  Constitutional: She is oriented to person, place, and time. She appears well-developed and well-nourished. No distress.  HENT:  Head: Normocephalic and atraumatic.  Right Ear: External ear normal.  Left Ear: External ear normal.  Nose: Nose normal.  Mouth/Throat: Oropharynx is clear and moist.  Eyes: Conjunctivae and EOM are normal. Pupils are equal, round, and reactive to light. Right eye exhibits no discharge. Left eye exhibits no discharge. No scleral icterus.  Neck: Normal range of  motion. Neck supple. No tracheal deviation present. No thyromegaly present.  Cardiovascular: Normal rate, regular rhythm, normal heart sounds and intact distal pulses.  No murmur heard.  Pulmonary/Chest: Effort normal. No respiratory distress. She has no wheezes. She has no rales.  Lymphadenopathy:  She has no cervical adenopathy.  Neurological: She is alert and oriented to person, place, and time.  Skin: Skin is warm and dry. No rash noted. She is not diaphoretic. No erythema.  There is a small cyst underneath a well-healed incision in the right axilla. There is no evidence of current infection  Psychiatric: Her behavior is normal.   Data Reviewed  Assessment   Chronically infected sebaceous cyst  right axilla   Plan   Removal of this is recommended to prevent further infection. I discussed this in detail with the patient and her husband. I discussed the risk which includes but is not limited to bleeding, infection, recurrence, etc. They understand and wish to proceed. Surgery will be scheduled. Likelihood of success is good   Tarrie Mcmichen A

## 2012-06-22 ENCOUNTER — Encounter (HOSPITAL_BASED_OUTPATIENT_CLINIC_OR_DEPARTMENT_OTHER): Payer: Self-pay | Admitting: Certified Registered"

## 2012-06-22 ENCOUNTER — Ambulatory Visit (HOSPITAL_BASED_OUTPATIENT_CLINIC_OR_DEPARTMENT_OTHER): Payer: BC Managed Care – PPO | Admitting: Certified Registered"

## 2012-06-22 ENCOUNTER — Encounter (HOSPITAL_BASED_OUTPATIENT_CLINIC_OR_DEPARTMENT_OTHER): Payer: Self-pay | Admitting: Certified Registered Nurse Anesthetist

## 2012-06-22 ENCOUNTER — Encounter (HOSPITAL_BASED_OUTPATIENT_CLINIC_OR_DEPARTMENT_OTHER): Payer: Self-pay | Admitting: *Deleted

## 2012-06-22 ENCOUNTER — Encounter (HOSPITAL_BASED_OUTPATIENT_CLINIC_OR_DEPARTMENT_OTHER)
Admission: RE | Disposition: A | Discharge status: Home / Self Care | Payer: Self-pay | Source: Ambulatory Visit | Attending: Surgery

## 2012-06-22 ENCOUNTER — Ambulatory Visit (HOSPITAL_BASED_OUTPATIENT_CLINIC_OR_DEPARTMENT_OTHER)
Admission: RE | Admit: 2012-06-22 | Discharge: 2012-06-22 | Disposition: A | Discharge status: Home / Self Care | Payer: BC Managed Care – PPO | Source: Ambulatory Visit | Attending: Surgery | Admitting: Surgery

## 2012-06-22 DIAGNOSIS — L723 Sebaceous cyst: Secondary | ICD-10-CM

## 2012-06-22 DIAGNOSIS — E119 Type 2 diabetes mellitus without complications: Secondary | ICD-10-CM | POA: Insufficient documentation

## 2012-06-22 DIAGNOSIS — B009 Herpesviral infection, unspecified: Secondary | ICD-10-CM | POA: Insufficient documentation

## 2012-06-22 DIAGNOSIS — Z833 Family history of diabetes mellitus: Secondary | ICD-10-CM | POA: Insufficient documentation

## 2012-06-22 DIAGNOSIS — J45909 Unspecified asthma, uncomplicated: Secondary | ICD-10-CM | POA: Insufficient documentation

## 2012-06-22 DIAGNOSIS — E785 Hyperlipidemia, unspecified: Secondary | ICD-10-CM | POA: Insufficient documentation

## 2012-06-22 DIAGNOSIS — F429 Obsessive-compulsive disorder, unspecified: Secondary | ICD-10-CM | POA: Insufficient documentation

## 2012-06-22 HISTORY — PX: MASS EXCISION: SHX2000

## 2012-06-22 LAB — GLUCOSE, CAPILLARY
Glucose-Capillary: 176 mg/dL — ABNORMAL HIGH (ref 70–99)
Glucose-Capillary: 188 mg/dL — ABNORMAL HIGH (ref 70–99)

## 2012-06-22 SURGERY — EXCISION MASS
Anesthesia: General | Site: Axilla | Laterality: Right | Wound class: Clean

## 2012-06-22 MED ORDER — ONDANSETRON HCL 4 MG/2ML IJ SOLN: 4.0000 mg | Freq: Four times a day (QID) | INTRAMUSCULAR | Status: DC | PRN

## 2012-06-22 MED ORDER — MORPHINE SULFATE 2 MG/ML IJ SOLN: 2.0000 mg | INTRAMUSCULAR | Status: DC | PRN

## 2012-06-22 MED ORDER — OXYCODONE HCL 5 MG PO TABS: 5.0000 mg | ORAL_TABLET | ORAL | Status: DC | PRN

## 2012-06-22 MED ORDER — HYDROMORPHONE HCL PF 1 MG/ML IJ SOLN: 0.2500 mg | INTRAMUSCULAR | Status: DC | PRN

## 2012-06-22 MED ORDER — MIDAZOLAM HCL 5 MG/5ML IJ SOLN
INTRAMUSCULAR | Status: DC | PRN
  Administered 2012-06-22: 1 mg via INTRAVENOUS

## 2012-06-22 MED ORDER — ACETAMINOPHEN 325 MG PO TABS: 650.0000 mg | ORAL_TABLET | ORAL | Status: DC | PRN

## 2012-06-22 MED ORDER — BUPIVACAINE-EPINEPHRINE 0.5% -1:200000 IJ SOLN
INTRAMUSCULAR | Status: DC | PRN
  Administered 2012-06-22: 14 mL

## 2012-06-22 MED ORDER — LIDOCAINE HCL (CARDIAC) 20 MG/ML IV SOLN
INTRAVENOUS | Status: DC | PRN
  Administered 2012-06-22: 60 mg via INTRAVENOUS

## 2012-06-22 MED ORDER — SODIUM CHLORIDE 0.9 % IJ SOLN: 3.0000 mL | INTRAMUSCULAR | Status: DC | PRN

## 2012-06-22 MED ORDER — CEFAZOLIN SODIUM-DEXTROSE 2-3 GM-% IV SOLR
2.0000 g | INTRAVENOUS | Status: AC
  Administered 2012-06-22: 2 g via INTRAVENOUS

## 2012-06-22 MED ORDER — SODIUM CHLORIDE 0.9 % IJ SOLN: 3.0000 mL | Freq: Two times a day (BID) | INTRAMUSCULAR | Status: DC

## 2012-06-22 MED ORDER — PROPOFOL 10 MG/ML IV BOLUS
INTRAVENOUS | Status: DC | PRN
  Administered 2012-06-22: 150 mg via INTRAVENOUS

## 2012-06-22 MED ORDER — ONDANSETRON HCL 4 MG/2ML IJ SOLN
INTRAMUSCULAR | Status: DC | PRN
  Administered 2012-06-22: 4 mg via INTRAVENOUS

## 2012-06-22 MED ORDER — ACETAMINOPHEN 650 MG RE SUPP: 650.0000 mg | RECTAL | Status: DC | PRN

## 2012-06-22 MED ORDER — OXYCODONE HCL 5 MG PO TABS: 5.0000 mg | ORAL_TABLET | Freq: Once | ORAL | Status: DC | PRN

## 2012-06-22 MED ORDER — LACTATED RINGERS IV SOLN
INTRAVENOUS | Status: DC
  Administered 2012-06-22: 07:00:00 via INTRAVENOUS

## 2012-06-22 MED ORDER — FENTANYL CITRATE 0.05 MG/ML IJ SOLN
INTRAMUSCULAR | Status: DC | PRN
  Administered 2012-06-22: 50 ug via INTRAVENOUS

## 2012-06-22 MED ORDER — DEXAMETHASONE SODIUM PHOSPHATE 4 MG/ML IJ SOLN
INTRAMUSCULAR | Status: DC | PRN
  Administered 2012-06-22: 10 mg via INTRAVENOUS

## 2012-06-22 MED ORDER — HYDROCODONE-ACETAMINOPHEN 5-325 MG PO TABS: 1.0000 | ORAL_TABLET | ORAL | Status: AC | PRN

## 2012-06-22 MED ORDER — SODIUM CHLORIDE 0.9 % IV SOLN: 250.0000 mL | INTRAVENOUS | Status: DC | PRN

## 2012-06-22 MED ORDER — OXYCODONE HCL 5 MG/5ML PO SOLN: 5.0000 mg | Freq: Once | ORAL | Status: DC | PRN

## 2012-06-22 SURGICAL SUPPLY — 44 items
ADH SKN CLS APL DERMABOND .7 (GAUZE/BANDAGES/DRESSINGS) ×1
APL SKNCLS STERI-STRIP NONHPOA (GAUZE/BANDAGES/DRESSINGS)
BENZOIN TINCTURE PRP APPL 2/3 (GAUZE/BANDAGES/DRESSINGS) ×1 IMPLANT
BLADE HEX COATED 2.75 (ELECTRODE) ×2 IMPLANT
BLADE SURG 15 STRL LF DISP TIS (BLADE) ×1 IMPLANT
BLADE SURG 15 STRL SS (BLADE) ×2
BLADE SURG ROTATE 9660 (MISCELLANEOUS) IMPLANT
CANISTER SUCTION 1200CC (MISCELLANEOUS) IMPLANT
CHLORAPREP W/TINT 26ML (MISCELLANEOUS) ×2 IMPLANT
CLOTH BEACON ORANGE TIMEOUT ST (SAFETY) ×2 IMPLANT
COVER MAYO STAND STRL (DRAPES) ×2 IMPLANT
COVER TABLE BACK 60X90 (DRAPES) ×2 IMPLANT
DECANTER SPIKE VIAL GLASS SM (MISCELLANEOUS) IMPLANT
DERMABOND ADVANCED (GAUZE/BANDAGES/DRESSINGS) ×1
DERMABOND ADVANCED .7 DNX12 (GAUZE/BANDAGES/DRESSINGS) IMPLANT
DRAPE PED LAPAROTOMY (DRAPES) ×2 IMPLANT
DRAPE UTILITY XL STRL (DRAPES) ×2 IMPLANT
DRSG TEGADERM 4X4.75 (GAUZE/BANDAGES/DRESSINGS) ×2 IMPLANT
ELECT REM PT RETURN 9FT ADLT (ELECTROSURGICAL) ×2
ELECTRODE REM PT RTRN 9FT ADLT (ELECTROSURGICAL) ×1 IMPLANT
GAUZE SPONGE 4X4 12PLY STRL LF (GAUZE/BANDAGES/DRESSINGS) ×2 IMPLANT
GLOVE SURG SIGNA 7.5 PF LTX (GLOVE) ×2 IMPLANT
GOWN PREVENTION PLUS XLARGE (GOWN DISPOSABLE) ×2 IMPLANT
GOWN PREVENTION PLUS XXLARGE (GOWN DISPOSABLE) ×2 IMPLANT
NDL HYPO 25X1 1.5 SAFETY (NEEDLE) ×1 IMPLANT
NEEDLE HYPO 25X1 1.5 SAFETY (NEEDLE) ×2 IMPLANT
NS IRRIG 1000ML POUR BTL (IV SOLUTION) ×1 IMPLANT
PACK BASIN DAY SURGERY FS (CUSTOM PROCEDURE TRAY) ×2 IMPLANT
PENCIL BUTTON HOLSTER BLD 10FT (ELECTRODE) ×2 IMPLANT
SLEEVE SCD COMPRESS KNEE MED (MISCELLANEOUS) IMPLANT
SPONGE LAP 4X18 X RAY DECT (DISPOSABLE) ×2 IMPLANT
STRIP CLOSURE SKIN 1/2X4 (GAUZE/BANDAGES/DRESSINGS) ×1 IMPLANT
SUT MNCRL AB 4-0 PS2 18 (SUTURE) ×1 IMPLANT
SUT VIC AB 2-0 SH 27 (SUTURE)
SUT VIC AB 2-0 SH 27XBRD (SUTURE) IMPLANT
SUT VIC AB 3-0 SH 27 (SUTURE) ×2
SUT VIC AB 3-0 SH 27X BRD (SUTURE) IMPLANT
SYR BULB 3OZ (MISCELLANEOUS) ×1 IMPLANT
SYR CONTROL 10ML LL (SYRINGE) ×2 IMPLANT
TOWEL OR 17X24 6PK STRL BLUE (TOWEL DISPOSABLE) ×2 IMPLANT
TOWEL OR NON WOVEN STRL DISP B (DISPOSABLE) ×2 IMPLANT
TUBE CONNECTING 20X1/4 (TUBING) IMPLANT
WATER STERILE IRR 1000ML POUR (IV SOLUTION) ×1 IMPLANT
YANKAUER SUCT BULB TIP NO VENT (SUCTIONS) IMPLANT

## 2012-06-22 NOTE — Anesthesia Procedure Notes (Signed)
Procedure Name: LMA Insertion Date/Time: 06/22/2012 7:23 AM Performed by: Atziry Baranski D Pre-anesthesia Checklist: Patient identified, Emergency Drugs available, Suction available and Patient being monitored Patient Re-evaluated:Patient Re-evaluated prior to inductionOxygen Delivery Method: Circle System Utilized Preoxygenation: Pre-oxygenation with 100% oxygen Intubation Type: IV induction Ventilation: Mask ventilation without difficulty LMA: LMA inserted LMA Size: 4.0 Number of attempts: 1 Airway Equipment and Method: bite block Placement Confirmation: positive ETCO2 Tube secured with: Tape Dental Injury: Teeth and Oropharynx as per pre-operative assessment

## 2012-06-22 NOTE — Interval H&P Note (Signed)
History and Physical Interval Note:  No  change  06/22/2012 7:01 AM  Claire Hill  has presented today for surgery, with the diagnosis of chronic Right axillary cyst  The various methods of treatment have been discussed with the patient and family. After consideration of risks, benefits and other options for treatment, the patient has consented to  Procedure(s) (LRB): EXCISION CHRONIC RIGHT AXILLARY CYST as a surgical intervention .  The patient's history has been reviewed, patient examined, no change in status, stable for surgery.  I have reviewed the patient's chart and labs.  Questions were answered to the patient's satisfaction.     [name]

## 2012-06-22 NOTE — Op Note (Signed)
EXCISION MASS  Procedure Note  Claire Hill 06/22/2012   Pre-op Diagnosis: chronic Right axillary cyst     Post-op Diagnosis: same  Procedure(s): EXCISION CHRONIC RIGHT AXILLARY EPIDERMAL CYST  Surgeon(s): Shelly Rubenstein, MD  Anesthesia: Monitor Anesthesia Care  Staff:  Joylene Grapes, RN - Circulator Raliegh Scarlet, RN - Scrub Person Chrystine Oiler, RN - Circulator Assistant  Estimated Blood Loss: Minimal               Specimens: right axillary cyst         Procedure: The patient was brought to the operating room and identified as the correct patient. She was placed supine on the operating room table and anesthesia was induced. Her right axilla was then prepped and draped in the usual sterile fashion. I anesthetized the skin with half percent Marcaine. I made an elliptical incision around the chronic cyst in the axilla. I then completely excised with the electrocautery. The specimen was sent to pathology for evaluation. I achieved hemostasis with the cautery. I then closed the subcutaneous tissue with interrupted 3-0 Vicryl sutures. I then closed the skin with a running 4-0 Monocryl. Dermabond was then applied. The patient tolerated the procedure well. All the counts were correct at the end of the procedure. The patient was then extubated in the operating room and taken in stable condition to the recovery room. The size of the excision was approximately 2.5 cm. [name]   Date: 06/22/2012  Time: 7:42 AM

## 2012-06-22 NOTE — Anesthesia Postprocedure Evaluation (Signed)
  Anesthesia Post-op Note  Patient: Claire Hill  Procedure(s) Performed: Procedure(s) (LRB): EXCISION MASS (Right)  Patient Location: PACU  Anesthesia Type: General  Level of Consciousness: awake and alert   Airway and Oxygen Therapy: Patient Spontanous Breathing and Patient connected to face mask oxygen  Post-op Pain: none  Post-op Assessment: Post-op Vital signs reviewed, Patient's Cardiovascular Status Stable, Respiratory Function Stable, Patent Airway and No signs of Nausea or vomiting  Post-op Vital Signs: Reviewed and stable  Complications: No apparent anesthesia complications

## 2012-06-22 NOTE — Anesthesia Preprocedure Evaluation (Signed)
Anesthesia Evaluation  Patient identified by MRN, date of birth, ID band Patient awake    Reviewed: Allergy & Precautions, H&P , NPO status , Patient's Chart, lab work & pertinent test results  Airway Mallampati: II TM Distance: >3 FB Neck ROM: Full    Dental No notable dental hx. (+) Teeth Intact and Dental Advisory Given   Pulmonary asthma ,  breath sounds clear to auscultation  Pulmonary exam normal       Cardiovascular negative cardio ROS  Rhythm:Regular Rate:Normal     Neuro/Psych PSYCHIATRIC DISORDERS negative neurological ROS     GI/Hepatic negative GI ROS, Neg liver ROS,   Endo/Other  Well Controlled, Type 2, Oral Hypoglycemic Agents  Renal/GU negative Renal ROS  negative genitourinary   Musculoskeletal   Abdominal   Peds  Hematology negative hematology ROS (+)   Anesthesia Other Findings   Reproductive/Obstetrics negative OB ROS                           Anesthesia Physical Anesthesia Plan  ASA: II  Anesthesia Plan: General   Post-op Pain Management:    Induction: Intravenous  Airway Management Planned: LMA  Additional Equipment:   Intra-op Plan:   Post-operative Plan: Extubation in OR  Informed Consent: I have reviewed the patients History and Physical, chart, labs and discussed the procedure including the risks, benefits and alternatives for the proposed anesthesia with the patient or authorized representative who has indicated his/her understanding and acceptance.   Dental advisory given  Plan Discussed with: CRNA  Anesthesia Plan Comments:         Anesthesia Quick Evaluation

## 2012-06-22 NOTE — Transfer of Care (Signed)
Immediate Anesthesia Transfer of Care Note  Patient: Claire Hill  Procedure(s) Performed: Procedure(s) (LRB): EXCISION MASS (Right)  Patient Location: PACU  Anesthesia Type: General  Level of Consciousness: awake, alert , oriented and patient cooperative  Airway & Oxygen Therapy: Patient Spontanous Breathing and Patient connected to face mask oxygen  Post-op Assessment: Report given to PACU RN and Post -op Vital signs reviewed and stable  Post vital signs: Reviewed and stable  Complications: No apparent anesthesia complications

## 2012-06-26 ENCOUNTER — Encounter (HOSPITAL_BASED_OUTPATIENT_CLINIC_OR_DEPARTMENT_OTHER): Payer: Self-pay | Admitting: Surgery

## 2012-07-08 ENCOUNTER — Other Ambulatory Visit: Payer: Self-pay | Admitting: Family Medicine

## 2012-07-13 ENCOUNTER — Other Ambulatory Visit: Payer: Self-pay | Admitting: Family Medicine

## 2012-07-16 ENCOUNTER — Encounter (INDEPENDENT_AMBULATORY_CARE_PROVIDER_SITE_OTHER): Payer: BC Managed Care – PPO | Admitting: Surgery

## 2012-07-19 ENCOUNTER — Encounter: Payer: Self-pay | Admitting: Family Medicine

## 2012-07-27 ENCOUNTER — Encounter (INDEPENDENT_AMBULATORY_CARE_PROVIDER_SITE_OTHER): Payer: Self-pay | Admitting: Surgery

## 2012-07-27 ENCOUNTER — Ambulatory Visit (INDEPENDENT_AMBULATORY_CARE_PROVIDER_SITE_OTHER): Payer: BC Managed Care – PPO | Admitting: Surgery

## 2012-07-27 VITALS — BP 118/78 | HR 80 | Temp 97.4°F | Resp 14 | Ht 68.0 in | Wt 174.6 lb

## 2012-07-27 DIAGNOSIS — Z09 Encounter for follow-up examination after completed treatment for conditions other than malignant neoplasm: Secondary | ICD-10-CM

## 2012-07-27 NOTE — Progress Notes (Signed)
Subjective:     Patient ID: Claire Hill, female   DOB: Feb 03, 1971, 41 y.o.   MRN: [id number]  HPI  She is here for her first postop visit status post excision of a chronic cyst in her right axilla. She is doing well has no complaints Review of Systems     Objective:   Physical Exam    On exam, the incision is healing well. There is some slight phlebitis only in her right arm near the axilla  The final pathology showed a chronic epidermal inclusion cyst with no evidence of malignancy Assessment:     Patient stable postop    Plan:     I will see her back as needed

## 2012-08-08 ENCOUNTER — Other Ambulatory Visit: Payer: Self-pay | Admitting: Family Medicine

## 2012-08-21 ENCOUNTER — Ambulatory Visit (INDEPENDENT_AMBULATORY_CARE_PROVIDER_SITE_OTHER): Payer: BC Managed Care – PPO | Admitting: Family Medicine

## 2012-08-21 ENCOUNTER — Encounter: Payer: Self-pay | Admitting: Family Medicine

## 2012-08-21 VITALS — BP 116/72 | HR 74 | Temp 98.3°F | Wt 173.6 lb

## 2012-08-21 DIAGNOSIS — E1165 Type 2 diabetes mellitus with hyperglycemia: Secondary | ICD-10-CM

## 2012-08-21 DIAGNOSIS — IMO0001 Reserved for inherently not codable concepts without codable children: Secondary | ICD-10-CM

## 2012-08-21 DIAGNOSIS — E785 Hyperlipidemia, unspecified: Secondary | ICD-10-CM

## 2012-08-21 LAB — HEPATIC FUNCTION PANEL
Albumin: 3.8 g/dL (ref 3.5–5.2)
Alkaline Phosphatase: 76 U/L (ref 39–117)

## 2012-08-21 LAB — HEMOGLOBIN A1C: Hgb A1c MFr Bld: 8.7 % — ABNORMAL HIGH (ref 4.6–6.5)

## 2012-08-21 LAB — LIPID PANEL
Cholesterol: 139 mg/dL (ref 0–200)
HDL: 55 mg/dL (ref >39.00)
VLDL: 16.6 mg/dL (ref 0.0–40.0)

## 2012-08-21 LAB — POCT URINALYSIS DIPSTICK
Bilirubin, UA: NEGATIVE
Glucose, UA: NEGATIVE
Ketones, UA: NEGATIVE
Spec Grav, UA: >=1.03

## 2012-08-21 LAB — MICROALBUMIN / CREATININE URINE RATIO: Microalb, Ur: 1.3 mg/dL (ref 0.0–1.9)

## 2012-08-21 LAB — BASIC METABOLIC PANEL
BUN: 12 mg/dL (ref 6–23)
GFR: 101.22 mL/min (ref >60.00)
Potassium: 4.1 mEq/L (ref 3.5–5.1)

## 2012-08-21 MED ORDER — ASPIRIN 81 MG PO TBEC: 81.0000 mg | DELAYED_RELEASE_TABLET | Freq: Every day | ORAL | Status: DC

## 2012-08-21 MED ORDER — METFORMIN HCL ER 500 MG PO TB24: 1000.0000 mg | ORAL_TABLET | Freq: Every evening | ORAL | Status: DC

## 2012-08-21 NOTE — Patient Instructions (Signed)
Diabetes and Standards of Medical Care  Diabetes is complicated. You may find that your diabetes team includes a dietitian, nurse, diabetes educator, eye doctor, and more. To help everyone know what is going on and to help you get the care you deserve, the following schedule of care was developed to help keep you on track. Below are the tests, exams, vaccines, medicines, education, and plans you will need. A1c test  Performed at least 2 times a year if you are meeting treatment goals.  Performed 4 times a year if therapy has changed or if you are not meeting therapy/glycemic goals. Aspirin medicine  Take daily as directed by your caregiver. Blood pressure test  Performed at every routine medical visit. The goal is less than 130/80 mm/Hg. Dental exam  Get a dental exam at least 2 times a year. Dilated eye exam (retinal exam)  Type 1 diabetes: Get an exam within 5 years of diagnosis and then yearly.  Type 2 diabetes: Get an exam at diagnosis and then yearly. All exams thereafter can be extended to every 2 to 3 years if one or more exams have been normal. Foot care exam  Visual foot exams are performed at every routine medical visit. The exams check for cuts, injuries, or other problems with the feet.  A comprehensive foot exam should be done yearly. This includes visual inspection as well as assessing foot pulses and testing for loss of sensation. Kidney function test (urine microalbumin)  Performed once a year.  Type 1 diabetes: The first test is performed 5 years after diagnosis.  Type 2 diabetes: The first test is performed at the time of diagnosis.  A serum creatinine and estimated glomerular filtration rate (eGFR) test is done once a year to tell the level of chronic kidney disease (CKD), if present. Lipid profile (Cholesterol, HDL, LDL, Triglycerides)  Performed once a year for most people. If at low risk, may be assessed every 2 years.  The goal for LDL is less than 100  mg/dl. If at high risk, the goal is less than 70 mg/dl.  The goal for HDL is higher than 40 mg/dl for men and higher than 50 mg/dl for women.  The goal for triglycerides is less than 150 mg/dl. Flu vaccine, pneumonia vaccine, and hepatitis B vaccine  The flu vaccine is recommended yearly.  The pneumonia vaccine is generally given once in a lifetime. However, there are some instances where another vaccine is recommended. Check with your caregiver.  The hepatitis B vaccine is also recommended for adults with diabetes. Diabetes self-management education  Recommended at diagnosis and ongoing as needed. Treatment plan  Reviewed at every medical visit. Document Released: 08/07/2009 Document Revised: 01/02/2012 Document Reviewed: 04/12/2011 ExitCare Patient Information 2013 [employer]. Diabetes Meal Planning Guide The diabetes meal planning guide is a tool to help you plan your meals and snacks. It is important for people with diabetes to manage their blood glucose (sugar) levels. Choosing the right foods and the right amounts throughout your day will help control your blood glucose. Eating right can even help you improve your blood pressure and reach or maintain a healthy weight. CARBOHYDRATE COUNTING MADE EASY When you eat carbohydrates, they turn to sugar. This raises your blood glucose level. Counting carbohydrates can help you control this level so you feel better. When you plan your meals by counting carbohydrates, you can have more flexibility in what you eat and balance your medicine with your food intake. Carbohydrate counting simply means adding   up the total amount of carbohydrate grams in your meals and snacks. Try to eat about the same amount at each meal. Foods with carbohydrates are listed below. Each portion below is 1 carbohydrate serving or 15 grams of carbohydrates. Ask your dietician how many grams of carbohydrates you should eat at each meal or snack. Grains and  Starches  1 slice bread.   English muffin or hotdog/hamburger bun.   cup cold cereal (unsweetened).   cup cooked pasta or rice.   cup starchy vegetables (corn, potatoes, peas, beans, winter squash).  1 tortilla (6 inches).   bagel.  1 waffle or pancake (size of a CD).   cup cooked cereal.  4 to 6 small crackers. *Whole grain is recommended. Fruit  1 cup fresh unsweetened berries, melon, papaya, pineapple.  1 small fresh fruit.   banana or mango.   cup fruit juice (4 oz unsweetened).   cup canned fruit in natural juice or water.  2 tbs dried fruit.  12 to 15 grapes or cherries. Milk and Yogurt  1 cup fat-free or 1% milk.  1 cup soy milk.  6 oz light yogurt with sugar-free sweetener.  6 oz low-fat soy yogurt.  6 oz plain yogurt. Vegetables  1 cup raw or  cup cooked is counted as 0 carbohydrates or a "free" food.  If you eat 3 or more servings at 1 meal, count them as 1 carbohydrate serving. Other Carbohydrates   oz chips or pretzels.   cup ice cream or frozen yogurt.   cup sherbet or sorbet.  2 inch square cake, no frosting.  1 tbs honey, sugar, jam, jelly, or syrup.  2 small cookies.  3 squares of graham crackers.  3 cups popcorn.  6 crackers.  1 cup broth-based soup.  Count 1 cup casserole or other mixed foods as 2 carbohydrate servings.  Foods with less than 20 calories in a serving may be counted as 0 carbohydrates or a "free" food. You may want to purchase a book or computer software that lists the carbohydrate gram counts of different foods. In addition, the nutrition facts panel on the labels of the foods you eat are a good source of this information. The label will tell you how big the serving size is and the total number of carbohydrate grams you will be eating per serving. Divide this number by 15 to obtain the number of carbohydrate servings in a portion. Remember, 1 carbohydrate serving equals 15 grams of  carbohydrate. SERVING SIZES Measuring foods and serving sizes helps you make sure you are getting the right amount of food. The list below tells how big or small some common serving sizes are.  1 oz.........4 stacked dice.  3 oz.........Deck of cards.  1 tsp........Tip of little finger.  1 tbs........Thumb.  2 tbs........Golf ball.   cup.......Half of a fist.  1 cup........A fist. SAMPLE DIABETES MEAL PLAN Below is a sample meal plan that includes foods from the grain and starches, dairy, vegetable, fruit, and meat groups. A dietician can individualize a meal plan to fit your calorie needs and tell you the number of servings needed from each food group. However, controlling the total amount of carbohydrates in your meal or snack is more important than making sure you include all of the food groups at every meal. You may interchange carbohydrate containing foods (dairy, starches, and fruits). The meal plan below is an example of a 2000 calorie diet using carbohydrate counting. This meal plan has 17 carbohydrate   servings. Breakfast  1 cup oatmeal (2 carb servings).   cup light yogurt (1 carb serving).  1 cup blueberries (1 carb serving).   cup almonds. Snack  1 large apple (2 carb servings).  1 low-fat string cheese stick. Lunch  Chicken breast salad.  1 cup spinach.   cup chopped tomatoes.  2 oz chicken breast, sliced.  2 tbs low-fat Italian dressing.  12 whole-wheat crackers (2 carb servings).  12 to 15 grapes (1 carb serving).  1 cup low-fat milk (1 carb serving). Snack  1 cup carrots.   cup hummus (1 carb serving). Dinner  3 oz broiled salmon.  1 cup brown rice (3 carb servings). Snack  1  cups steamed broccoli (1 carb serving) drizzled with 1 tsp olive oil and lemon juice.  1 cup light pudding (2 carb servings). DIABETES MEAL PLANNING WORKSHEET Your dietician can use this worksheet to help you decide how many servings of foods and what types  of foods are right for you.  BREAKFAST Food Group and Servings / Carb Servings Grain/Starches __________________________________ Dairy __________________________________________ Vegetable ______________________________________ Fruit ___________________________________________ Meat __________________________________________ Fat ____________________________________________ LUNCH Food Group and Servings / Carb Servings Grain/Starches ___________________________________ Dairy ___________________________________________ Fruit ____________________________________________ Meat ___________________________________________ Fat _____________________________________________ DINNER Food Group and Servings / Carb Servings Grain/Starches ___________________________________ Dairy ___________________________________________ Fruit ____________________________________________ Meat ___________________________________________ Fat _____________________________________________ SNACKS Food Group and Servings / Carb Servings Grain/Starches ___________________________________ Dairy ___________________________________________ Vegetable _______________________________________ Fruit ____________________________________________ Meat ___________________________________________ Fat _____________________________________________ DAILY TOTALS Starches _________________________ Vegetable ________________________ Fruit ____________________________ Dairy ____________________________ Meat ____________________________ Fat ______________________________ Document Released: 07/07/2005 Document Revised: 01/02/2012 Document Reviewed: 05/18/2009 ExitCare Patient Information 2013 [employer].  

## 2012-08-23 ENCOUNTER — Encounter: Payer: Self-pay | Admitting: Family Medicine

## 2012-08-23 NOTE — Progress Notes (Signed)
  Subjective:     Claire Hill is a 41 y.o. female who presents for follow up of diabetes.. Current symptoms include: visual disturbances. Patient denies foot ulcerations, hypoglycemia , nausea, paresthesia of the feet, polydipsia, polyuria, vomiting and weight loss. Evaluation to date has been: fasting blood sugar, fasting lipid panel, hemoglobin A1C and microalbuminuria. Home sugars: are running high. Current treatments: more intensive attention to diet which has been not very effective and Continued metformin which has been not very effective. Last dilated eye exam recent.  The following portions of the patient's history were reviewed and updated as appropriate: allergies, current medications, past family history, past medical history, past social history, past surgical history and problem list.  Review of Systems Pertinent items are noted in HPI.    Objective:    BP 116/72  Pulse 74  Temp 98.3 F (36.8 C) (Oral)  Wt 173 lb 9.6 oz (78.744 kg)  SpO2 98% General appearance: alert, cooperative, appears stated age and no distress Lungs: clear to auscultation bilaterally Heart: S1, S2 normal Extremities: Sensory exam of the foot is normal, tested with the monofilament. Good pulses, no lesions or ulcers, good peripheral pulses.     Patient was evaluated for proper footwear and sizing.  Laboratory: No components found with this basename: A1C      Assessment:    Diabetes mellitus Type II, under poor control.    Plan:    Discussed general issues about diabetes pathophysiology and management. Addressed ADA diet. Discussed foot care. Reminded to get yearly retinal exam. Increased dose of metformin; see  medication orders. Labs: fasting blood sugar, fasting lipid panel, hemoglobin A1C and microalbuminuria. Nutritionist referral. Reminded to bring in blood sugar diary at next visit. Follow up in 3 months or as needed.

## 2012-08-30 ENCOUNTER — Other Ambulatory Visit: Payer: Self-pay | Admitting: Family Medicine

## 2012-08-30 MED ORDER — GLUCOSE BLOOD VI STRP: ORAL_STRIP | Status: DC

## 2012-08-30 NOTE — Telephone Encounter (Signed)
Called pt to verify pharmacy and Rx sent pt aware.      MW

## 2012-09-11 ENCOUNTER — Other Ambulatory Visit: Payer: Self-pay | Admitting: Family Medicine

## 2012-09-11 NOTE — Telephone Encounter (Signed)
Rx sent.    MW 

## 2012-09-26 ENCOUNTER — Ambulatory Visit: Payer: BC Managed Care – PPO | Admitting: Dietician

## 2012-10-15 ENCOUNTER — Other Ambulatory Visit: Payer: Self-pay | Admitting: Family Medicine

## 2012-10-15 DIAGNOSIS — E119 Type 2 diabetes mellitus without complications: Secondary | ICD-10-CM

## 2012-10-15 NOTE — Telephone Encounter (Signed)
Refill for metformin sent to pharmacy.  

## 2012-10-30 ENCOUNTER — Encounter: Payer: BC Managed Care – PPO | Attending: Family Medicine | Admitting: Dietician

## 2012-10-30 ENCOUNTER — Encounter: Payer: Self-pay | Admitting: Dietician

## 2012-10-30 VITALS — Ht 66.0 in | Wt 173.3 lb

## 2012-10-30 DIAGNOSIS — Z713 Dietary counseling and surveillance: Secondary | ICD-10-CM | POA: Insufficient documentation

## 2012-10-30 DIAGNOSIS — IMO0001 Reserved for inherently not codable concepts without codable children: Secondary | ICD-10-CM

## 2012-10-30 NOTE — Patient Instructions (Addendum)
   Don't skip meals, have a snack or small meal in the evening.  Read food labels and monitor the level of carbohydrate and sugar.  Try to have fiber in foods (cereals, breads, pasta, rice)  Try to have 3 gm of fiber in cereal, and  2 gm fiber in breads.  Try to keep the sugar in products (food label to 0-9 gm of sugar per serving)  Avoid sugar and try to use the sugar substitutes (Splenda, Stevia, Equal)  Use your computer to look up the information for grams of sugar/total carbohydrates in various foods at the web sites (www.calorieking.com)  Go to the web site of [employer] and [employer] and look up the nutritional information for carbohydrate levels and calories.  Aim for 150 minutes of aerobic exercise each week (walking, biking, swimming) Walk in the neighborhood or in bad weather, walk in place in your home.  Try for losing 8-17 lb over the next 3-4 months.

## 2012-10-30 NOTE — Progress Notes (Signed)
Medical Nutrition Therapy:  Appt start time: 1500 end time:  1630.   Assessment:  Primary concerns today: Comes today to learn more regarding how to eat with her diabetes.  Has a family history of a father and grandfather with diabetes.  We are assisted today by the Spanish interpreter and her husband who speaks [place].  He A1C on 08/21/2012 was 8.7 as compared to her level on 03/30/2012 of 9.3%.  Her husband is concerned and wants her to get her blood glucose under control.  He is computer literate and willing to use the sites for learning nutritional values and helping her to plan her meals.  BLOOD GLUCOSE MONITORING:  Now checking her blood glucose.  Does not have her meter or glucose log with her.  Recall is.  Fasting:  237, 107.  AC Lunch:  167  Evening (9:39-10:00): 307, 306   MEDICATIONS: Medication review completed.  Currently on Metformin 1000 mg in the AM.  DILATED EYE EXAM:  Has not had an exam in the last 12 months.  FOOT SELF-EXAM:  Not currently performing.  Explained the procedure.  HYPERGLYCEMIA:  Denies S/S of high blood glucose.  HYPOGLYCEMIA: Denies S/S of low blood glucose   DIETARY INTAKE:  Usual eating pattern includes 3 meals and 1 snacks per day.  Everyday foods include a variety of foods.  Avoided foods include limiting the concentrated sweets.    24-hr recall: O B ( AM): 8:00-8:30 Oatmeal ([employer]) with water or milk 6 oz (2 %), or cinnamon tea. Snk ( AM): none  L ( PM): soup chicken noodle or tomato or chicken or cafeteria ([employer]) and have meat, or fish or chicken and vegetables such as pinto beans, or green beans, or broccoli or rice.   Water Snk ( PM): banana sometimes. D ( PM): 5:45-6:00 PM  [place] 3 inch square, water, cereal (raisin bran) with milk Or about 2 times per week.Sometimes does not have an evening meal. Snk ( PM): none Beverages: milk, water, cinamon tea.  Usual physical activity: No planned physical activity.  Estimated energy  needs:  HT: 66 in   WT: 173.3 lb  BMI: 28 kg/m2  Adj WT: 144 lb  (65 kg) 1300-1400 calories 150-155 g carbohydrates 100-105 g protein 36-38 g fat  Progress Towards Goal(s):  No progress.   Nutritional Diagnosis:  [place]-2.1 Inpaired nutrition utilization As related to blood glucose.  As evidenced by diagnosis of type 2 diabetes with an A1C at 8.7%, and elevated blood glucose levels throughout the day..    Intervention:  Nutrition Recommended continuing to limit the concentrated sweets and to monitor portion sizes.  Provided handouts in Spanish..  Don't skip meals, have a snack or small meal in the evening.  Read food labels and monitor the level of carbohydrate and sugar.  Try to have fiber in foods (cereals, breads, pasta, rice)  Try to have 3 gm of fiber in cereal, and  2 gm fiber in breads.  Try to keep the sugar in products (food label to 0-9 gm of sugar per serving)  Avoid sugar and try to use the sugar substitutes (Splenda, Stevia, Equal)  Use your computer to look up the information for grams of sugar/total carbohydrates in various foods at the web sites (www.calorieking.com)  Go to the web site of [employer] and [employer] and look up the nutritional information for carbohydrate levels and calories.  Aim for 150 minutes of aerobic exercise each week (walking, biking, swimming) Walk in the neighborhood  or in bad weather, walk in place in your home.  Try for losing 8-17 lb over the next 3-4 months.  Handouts given during visit include:  Living Well with Diabetes  Carbohydrate Counting in Spanish by [employer] Self Exam Handout  Monitoring/Evaluation:  Dietary intake, exercise, blood glucose levels, and body weight in 8-12 weeks.Marland Kitchen

## 2012-11-19 ENCOUNTER — Encounter: Payer: Self-pay | Admitting: Dietician

## 2012-12-14 ENCOUNTER — Other Ambulatory Visit: Payer: Self-pay | Admitting: Family Medicine

## 2012-12-31 ENCOUNTER — Ambulatory Visit: Payer: BC Managed Care – PPO | Admitting: Dietician

## 2013-01-23 ENCOUNTER — Other Ambulatory Visit: Payer: Self-pay | Admitting: Family Medicine

## 2013-03-14 ENCOUNTER — Telehealth: Payer: Self-pay | Admitting: Family Medicine

## 2013-03-14 ENCOUNTER — Other Ambulatory Visit: Payer: Self-pay | Admitting: Family Medicine

## 2013-03-14 NOTE — Telephone Encounter (Signed)
Pt husband was called back. 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 Pt husband states that pt has not been following diet or controlling her Blood sugars well. States that when sugars spike her simplex flares up in her eye. Pt was made an appt for Friday afternoon after she gets out of work to discuss.

## 2013-03-14 NOTE — Telephone Encounter (Signed)
Pt husband called for his wife and had question about his wife diabetic medication because he states that her sugar was high this morning. Pt would like a call back thanks

## 2013-03-14 NOTE — Telephone Encounter (Signed)
Med filled.  

## 2013-03-15 ENCOUNTER — Ambulatory Visit (INDEPENDENT_AMBULATORY_CARE_PROVIDER_SITE_OTHER): Payer: BC Managed Care – PPO | Admitting: Family Medicine

## 2013-03-15 ENCOUNTER — Encounter: Payer: Self-pay | Admitting: Family Medicine

## 2013-03-15 VITALS — BP 118/84 | HR 75 | Temp 98.7°F | Wt 164.6 lb

## 2013-03-15 DIAGNOSIS — E118 Type 2 diabetes mellitus with unspecified complications: Secondary | ICD-10-CM

## 2013-03-15 DIAGNOSIS — E1165 Type 2 diabetes mellitus with hyperglycemia: Secondary | ICD-10-CM

## 2013-03-15 DIAGNOSIS — E0839 Diabetes mellitus due to underlying condition with other diabetic ophthalmic complication: Secondary | ICD-10-CM

## 2013-03-15 DIAGNOSIS — E785 Hyperlipidemia, unspecified: Secondary | ICD-10-CM

## 2013-03-15 DIAGNOSIS — E1339 Other specified diabetes mellitus with other diabetic ophthalmic complication: Secondary | ICD-10-CM

## 2013-03-15 LAB — POCT URINALYSIS DIPSTICK
Bilirubin, UA: NEGATIVE
Glucose, UA: NEGATIVE
Nitrite, UA: NEGATIVE
Spec Grav, UA: 1.015
Urobilinogen, UA: NEGATIVE

## 2013-03-15 NOTE — Progress Notes (Signed)
  Subjective:    Patient ID: Claire Hill, female    DOB: 01/04/1971, 42 y.o.   MRN: [id number]  HPI   DIABETES Disease Monitoring Blood Sugar ranges-120-196 Polyuria- no New Visual problems-no Medications Compliance- good Hypoglycemic symptoms- no   HYPERLIPIDEMIA Disease Monitoring See symptoms for Hypertension Medications Compliance- good RUQ pain- no  Muscle aches- no  ROS See HPI above   PMH Smoking Status noted   Review of Systems    as above Objective:   Physical Exam   BP 118/84  Pulse 75  Temp(Src) 98.7 F (37.1 C) (Oral)  Wt 164 lb 9.6 oz (74.662 kg)  BMI 26.58 kg/m2  SpO2 98% General appearance: alert, cooperative, appears stated age and no distress Lungs: clear to auscultation bilaterally Heart: S1, S2 normal Extremities: extremities normal, atraumatic, no cyanosis or edema Sensory exam of the foot is normal, tested with the monofilament. Good pulses, no lesions or ulcers, good peripheral pulses.        Assessment & Plan:

## 2013-03-15 NOTE — Patient Instructions (Addendum)
Diabetes and Standards of Medical Care  Diabetes is complicated. You may find that your diabetes team includes a dietitian, nurse, diabetes educator, eye doctor, and more. To help everyone know what is going on and to help you get the care you deserve, the following schedule of care was developed to help keep you on track. Below are the tests, exams, vaccines, medicines, education, and plans you will need. A1c test  Performed at least 2 times a year if you are meeting treatment goals.  Performed 4 times a year if therapy has changed or if you are not meeting treatment goals. Blood pressure test  Performed at every routine medical visit. The goal is less than 120/80 mmHg. Dental exam  Follow up with the dentist regularly. Eye exam  Diagnosed with type 1 diabetes as a child: Get an exam upon reaching the age of 10 years or older and having had diabetes for 3 5 years. Yearly eye exams are recommended after that initial eye exam.  Diagnosed with type 1 diabetes as an adult: Get an exam within 5 years of diagnosis and then yearly.  Diagnosed with type 2 diabetes: Get an exam as soon as possible after the diagnosis and then yearly. Foot care exam  Visual foot exams are performed at every routine medical visit. The exams check for cuts, injuries, or other problems with the feet.  A comprehensive foot exam should be done yearly. This includes visual inspection as well as assessing foot pulses and testing for loss of sensation. Kidney function test (urine microalbumin)  Performed once a year.  Type 1 diabetes: The first test is performed 5 years after diagnosis.  Type 2 diabetes: The first test is performed at the time of diagnosis.  A serum creatinine and estimated glomerular filtration rate (eGFR) test is done once a year to tell the level of chronic kidney disease (CKD), if present. Lipid profile (Cholesterol, HDL, LDL, Triglycerides)  Performed every 5 years for most people.  The  goal for LDL is less than 100 mg/dl. If at high risk, the goal is less than 70 mg/dl.  The goal for HDL is 40 mg/dl 50 mg/dl for men and 50 mg/dl 60 mg/dl for women. An HDL cholesterol of 60 mg/dL or higher gives some protection against heart disease.  The goal for triglycerides is less than 150 mg/dl. Influenza vaccine, pneumococcal vaccine, and hepatitis B vaccine  The influenza vaccine is recommended yearly.  The pneumococcal vaccine is generally given once in a lifetime. However, there are some instances when another vaccination is recommended. Check with your caregiver.  The hepatitis B vaccine is also recommended for adults with diabetes. Diabetes self-management education  Recommended at diagnosis and ongoing as needed. Treatment plan  Reviewed at every medical visit. Document Released: 08/07/2009 Document Revised: 09/26/2012 Document Reviewed: 04/12/2011 [facility] Patient Information 2014 [place], [place].  Diabetes y normas bsicas de atencin mdica  (Diabetes and Standards of Medical Care) La diabetes es una enfermedad complicada. El equipo que trate su diabetes incluye un nutricionista, un enfermero, un educador para la diabetes, un oftalmlogo y ms. Para que todas las personas conozcan sobre su enfermedad y para que los pacientes tengan los cuidados que necesitan, se crearon las siguientes normas bsicas para un mejor control. A continuacin se indican los exmenes, vacunas, medicamentos, educacin y planes que necesitar.  Anlisis A1c  Hgalos al menos 2 veces al ao si cumple los objetivos del [place].  Si le han cambiado el tratamiento o si no  cumple con los objetivos del tratamiento, debe hacerlo 4 veces al ao. Control de la presin arterial  Hgalas en cada visita mdica de rutina. El objetivo es llegar a menos de 120/80 mmHg. Examen dental  Concurra regularmente a las visitas de control con el dentista. Examen ocular  Diagnosticado con diabetes tipo 1 en  la niez: Debe hacerse estudios al llegar a los 10 aos o ms y ha sufrido de diabetes durante 3 a 5 aos. Se recomiendan hacer los exmenes oculares anuales despus de ese examen inicial.  Diagnosticado con diabetes tipo 1 en la edad adulta: Hgase un examen dentro de los 5 aos del diagnstico y luego una vez por ao.  Diagnosticado con diabetes tipo 2: Hgase un estudio lo antes posible despus del diagnstico y luego una vez por ao. Examen de los pies  Se har una inspeccin visual en cada visita mdica de rutina. Estos controles observarn si hay cortes, lesiones u otros problemas en los pies.  Una vez por ao debe hacerse un examen ms intensivo. Este examen incluye la inspeccin visual y Neomia Dear evaluacin de los pulsos del pie y la sensibilidad. Estudio de la funcin renal (microalbmina en orina)  Debe realizarse una vez por ao.  Diabetes tipo 1: La primera prueba se realiza a los 5 aos despus del diagnstico.  Diabetes tipo 2: La primera prueba se realiza en el momento del diagnstico.  La creatinina srica y el ndice de filtracin glomerular estimada (eGFR, por sus siglas en ingls) se realizan una vez por ao para informar el nivel de enfermedad renal crnica, si la hubiera. Perfil lipdico (colesterol, HDL, LDL, triglicridos)  La [employer] lo hacen cada 5 aos.  El objetivo para el LDL es menos de 100 mg / dl. Si tiene [employer], el objetivo es menos de 70 mg / dl.  El objetivo para el HDL es de 40 mg / dl-50 mg /dl para los hombres y 50 mg / dl-60 mg / dl para las mujeres. Un nivel de colesterol HDL de 60 mg / dl o superior da una cierta proteccin contra la enfermedad cardaca.  El objetivo para los triglicridos es de menos de 150 mg / dl. Vacuna contra la gripe, contra el neumococo y contra la hepatitis B   Se recomienda aplicarse la vacuna contra la gripe anualmente.  La vacuna antineumoccica se administra generalmente una vez en la vida. Sin  embargo, hay algunos casos en que se recomienda otra vacuna. Consulte con su mdico.  Tambin se recomienda la aplicacin de la vacuna contra la hepatitis B en los adultos con diabetes. Educacin para el autocontrol de la diabetes   Recomendaciones al momento del diagnstico y los controles segn sea necesario. Plan de tratamiento   Comentado en cada visita mdica. Document Released: 01/04/2010 Document Revised: 09/26/2012 [facility] Patient Information 2014 [place], [place].  Diets for Diabetes, Food Labeling Look at food labels to help you decide how much of a product you can eat. You will want to check the amount of total carbohydrate in a serving to see how the food fits into your meal plan. In the list of ingredients, the ingredient present in the largest amount by weight must be listed first, followed by the other ingredients in descending order. STANDARD OF IDENTITY Most products have a list of ingredients. However, foods that the Food and Drug Administration (FDA) has given a standard of identity do not need a list of ingredients. A standard of identity means that  a food must contain certain ingredients if it is called a particular name. Examples are mayonnaise, peanut butter, ketchup, jelly, and cheese. LABELING TERMS There are many terms found on food labels. Some of these terms have specific definitions. Some terms are regulated by the FDA, and the FDA has clearly specified how they can be used. Others are not regulated or well-defined and can be misleading and confusing. SPECIFICALLY DEFINED TERMS Nutritive Sweetener.  A sweetener that contains calories,such as table sugar or honey. Nonnutritive Sweetener.  A sweetener with few or no calories,such as saccharin, aspartame, sucralose, and cyclamate. LABELING TERMS REGULATED BY THE FDA Free.  The product contains only a tiny or small amount of fat, cholesterol, sodium, sugar, or calories. For example, a "fat-free" product will  contain less than 0.5 g of fat per serving. Low.  A food described as "low" in fat, saturated fat, cholesterol, sodium, or calories could be eaten fairly often without exceeding dietary guidelines. For example, "low in fat" means no more than 3 g of fat per serving. Lean.  "Lean" and "extra lean" are U.S. Department of Agriculture Architect) terms for use on meat and poultry products. "Lean" means the product contains less than 10 g of fat, 4 g of saturated fat, and 95 mg of cholesterol per serving. "Lean" is not as low in fat as a product labeled "low." Extra Lean.  "Extra lean" means the product contains less than 5 g of fat, 2 g of saturated fat, and 95 mg of cholesterol per serving. While "extra lean" has less fat than "lean," it is still higher in fat than a product labeled "low." Reduced, Less, Fewer.  A diet product that contains 25% less of a nutrient or calories than the regular version. For example, hot dogs might be labeled "25% less fat than our regular hot dogs." Light/Lite.  A diet product that contains  fewer calories or  the fat of the original. For example, "light in sodium" means a product with  the usual sodium. More.  One serving contains at least 10% more of the daily value of a vitamin, mineral, or fiber than usual. Good Source Of.  One serving contains 10% to 19% of the daily value for a particular vitamin, mineral, or fiber. Excellent Source Of.  One serving contains 20% or more of the daily value for a particular nutrient. Other terms used might be "high in" or "rich in." Enriched or Fortified.  The product contains added vitamins, minerals, or protein. Nutrition labeling must be used on enriched or fortified foods. Imitation.  The product has been altered so that it is lower in protein, vitamins, or minerals than the usual food,such as imitation peanut butter. Total Fat.  The number listed is the total of all fat found in a serving of the product. Under total  fat, food labels must list saturated fat and trans fat, which are associated with raising bad cholesterol and an increased risk of heart blood vessel disease. Saturated Fat.  Mainly fats from animal-based sources. Some examples are red meat, cheese, cream, whole milk, and coconut oil. Trans Fat.  Found in some fried snack foods, packaged foods, and fried restaurant foods. It is recommended you eat as close to 0 g of trans fat as possible, since it raises bad cholesterol and lowers good cholesterol. Polyunsaturated and Monounsaturated Fats.  More healthful fats. These fats are from plant sources. Total Carbohydrate.  The number of carbohydrate grams in a serving of the product. Under total  carbohydrate are listed the other carbohydrate sources, such as dietary fiber and sugars. Dietary Fiber.  A carbohydrate from plant sources. Sugars.  Sugars listed on the label contain all naturally occurring sugars as well as added sugars. LABELING TERMS NOT REGULATED BY THE FDA Sugarless.  Table sugar (sucrose) has not been added. However, the manufacturer may use another form of sugar in place of sucrose to sweeten the product. For example, sugar alcohols are used to sweeten foods. Sugar alcohols are a form of sugar but are not table sugar. If a product contains sugar alcohols in place of sucrose, it can still be labeled "sugarless." Low Salt, Salt-Free, Unsalted, No Salt, No Salt Added, Without Added Salt.  Food that is usually processed with salt has been made without salt. However, the food may contain sodium-containing additives, such as preservatives, leavening agents, or flavorings. Natural.  This term has no legal meaning. Organic.  Foods that are certified as organic have been inspected and approved by the USDA to ensure they are produced without pesticides, fertilizers containing synthetic ingredients, bioengineering, or ionizing radiation. Document Released: 10/13/2003 Document Revised:  01/02/2012 Document Reviewed: 04/30/2009 [facility] Patient Information 2014 [place], [place].

## 2013-03-15 NOTE — Assessment & Plan Note (Signed)
Check labs con't meds D/w pt and husband importance of getting DM under control.

## 2013-03-16 LAB — HEPATIC FUNCTION PANEL
Alkaline Phosphatase: 72 U/L (ref 39–117)
Bilirubin, Direct: 0.1 mg/dL (ref 0.0–0.3)
Indirect Bilirubin: 0.3 mg/dL (ref 0.0–0.9)

## 2013-03-16 LAB — LIPID PANEL
HDL: 84 mg/dL (ref >39)
LDL Cholesterol: 123 mg/dL — ABNORMAL HIGH (ref 0–99)
Total CHOL/HDL Ratio: 2.7 Ratio
Triglycerides: 100 mg/dL (ref <150)
VLDL: 20 mg/dL (ref 0–40)

## 2013-03-16 LAB — HEMOGLOBIN A1C
Hgb A1c MFr Bld: 8.5 % — ABNORMAL HIGH (ref <5.7)
Mean Plasma Glucose: 197 mg/dL — ABNORMAL HIGH (ref <117)

## 2013-03-16 LAB — BASIC METABOLIC PANEL
Chloride: 103 mEq/L (ref 96–112)
Potassium: 4 mEq/L (ref 3.5–5.3)
Sodium: 138 mEq/L (ref 135–145)

## 2013-03-27 ENCOUNTER — Telehealth: Payer: Self-pay

## 2013-03-27 MED ORDER — ALOGLIPTIN-METFORMIN HCL 12.5-1000 MG PO TABS: 1.0000 | ORAL_TABLET | Freq: Two times a day (BID) | ORAL | Status: DC

## 2013-03-27 MED ORDER — ATORVASTATIN CALCIUM 40 MG PO TABS: 40.0000 mg | ORAL_TABLET | Freq: Every day | ORAL | Status: DC

## 2013-03-27 NOTE — Telephone Encounter (Signed)
hgba1c elevated-- stop metformin, kazano 12.02/999 1 po bid ---- give samples with rx and discount card Cholesterol--- LDL goal < 70, HDL >50, TG < 150. Diet and exercise will increase HDL and decrease LDL and TG. Fish, Fish Oil, Flaxseed oil will also help increase the HDL and decrease Triglycerides. Recheck labs in 3 months---increase lipitor to 40 mg #30 2 refills.

## 2013-03-27 NOTE — Telephone Encounter (Signed)
Discussed with patient's husband and he voiced understanding. Samples and discount card left at check in. Med's faxed    KP

## 2013-03-28 ENCOUNTER — Telehealth: Payer: Self-pay | Admitting: Family Medicine

## 2013-03-28 ENCOUNTER — Other Ambulatory Visit: Payer: Self-pay | Admitting: Family Medicine

## 2013-03-28 MED ORDER — METFORMIN HCL ER 500 MG PO TB24: 1500.0000 mg | ORAL_TABLET | Freq: Every day | ORAL | Status: DC

## 2013-03-28 NOTE — Telephone Encounter (Addendum)
Pt called because the medicine Mirian Capuchin) she picked up yesterday from our office is making her sick. Would like for a nurse to call her back. thanks

## 2013-03-28 NOTE — Telephone Encounter (Signed)
Pt was called back stating that she has no vomiting or nausea. Pt is only having bad body and joint aches. Please advise.

## 2013-03-28 NOTE — Telephone Encounter (Signed)
Spoke with Claire Hill and he agreed to stopping the [employer] and will pick up the Metformin xr 500, he will call with any concerns    KP

## 2013-03-28 NOTE — Telephone Encounter (Signed)
Stop kazano----metformin xr 500 mg 3 po qd----

## 2013-04-08 ENCOUNTER — Telehealth: Payer: Self-pay | Admitting: Family Medicine

## 2013-04-08 MED ORDER — ONETOUCH DELICA LANCETS FINE MISC: 1.0000 | Freq: Two times a day (BID) | Status: DC

## 2013-04-08 NOTE — Telephone Encounter (Signed)
Rx sent 

## 2013-04-08 NOTE — Telephone Encounter (Signed)
Patient is calling to request a refill on her Lancets. She uses [facility] in [place].

## 2013-04-09 ENCOUNTER — Telehealth: Payer: Self-pay | Admitting: Family Medicine

## 2013-04-09 DIAGNOSIS — E119 Type 2 diabetes mellitus without complications: Secondary | ICD-10-CM

## 2013-04-09 NOTE — Telephone Encounter (Signed)
Please advise      KP 

## 2013-04-09 NOTE — Telephone Encounter (Signed)
Ok to refer to endo   For diabetes

## 2013-04-09 NOTE — Telephone Encounter (Signed)
Patient's husband called requesting referral for endocrinologist for the patient. CB# [phone]

## 2013-04-12 ENCOUNTER — Encounter: Payer: Self-pay | Admitting: Endocrinology

## 2013-04-12 ENCOUNTER — Telehealth: Payer: Self-pay | Admitting: Family Medicine

## 2013-04-12 ENCOUNTER — Ambulatory Visit (INDEPENDENT_AMBULATORY_CARE_PROVIDER_SITE_OTHER): Payer: BC Managed Care – PPO | Admitting: Endocrinology

## 2013-04-12 VITALS — BP 122/80 | Ht 66.0 in | Wt 159.0 lb

## 2013-04-12 DIAGNOSIS — E119 Type 2 diabetes mellitus without complications: Secondary | ICD-10-CM

## 2013-04-12 MED ORDER — SITAGLIPTIN PHOSPHATE 100 MG PO TABS: 100.0000 mg | ORAL_TABLET | Freq: Every day | ORAL | Status: DC

## 2013-04-12 NOTE — Patient Instructions (Addendum)
good diet and exercise habits significanly improve the control of your diabetes.  please let me know if you wish to be referred to a dietician.  high blood sugar is very risky to your health.  you should see an eye doctor every year.  You are at higher than average risk for pneumonia and hepatitis-B.  You should be vaccinated against both.   controlling your blood pressure and cholesterol drastically reduces the damage diabetes does to your body.  this also applies to quitting smoking.  please discuss these with your doctor.  check your blood sugar once a day.  vary the time of day when you check, between before the 3 meals, and at bedtime.  also check if you have symptoms of your blood sugar being too high or too low.  please keep a record of the readings and bring it to your next appointment here.  please call us sooner if your blood sugar goes below 70, or if you have a lot of readings over 200.  i have sent a prescription to your pharmacy, to add "januvia." If your blood sugar stays high, please call, so we can add "invokana." Please come back for a follow-up appointment in 3 months.

## 2013-04-12 NOTE — Progress Notes (Signed)
Subjective:    Patient ID: Claire Hill, female    DOB: Feb 18, 1971, 42 y.o.   MRN: [id number]  HPI pt states 2 years h/o dm.  she is unaware of any chronic complications.  she has never been on insulin.  pt says her diet and exercise are good.   She reports moderate myalgias, throught the body, and assoc fatigue. Past Medical History  Diagnosis Date  . Herpes simplex of eye   . OCD (obsessive compulsive disorder)   . Hyperlipidemia   . Anemia   . Diabetes mellitus   . Asthma     pt states no-no inhalers    Past Surgical History  Procedure Laterality Date  . No past surgeries    . Mass excision  06/22/2012    Procedure: EXCISION MASS;  Surgeon: Shelly Rubenstein, MD;  Location: [facility];  Service: General;  Laterality: Right;  Excision chronic Right axillary cyst    History   Social History  . Marital Status: Married    Spouse Name: N/A    Number of Children: N/A  . Years of Education: N/A   Occupational History  . temp agency    Social History Main Topics  . Smoking status: Never Smoker   . Smokeless tobacco: Never Used  . Alcohol Use: No  . Drug Use: No  . Sexually Active: Yes -- Female partner(s)   Other Topics Concern  . Not on file   Social History Narrative  . No narrative on file    Current Outpatient Prescriptions on File Prior to Visit  Medication Sig Dispense Refill  . acyclovir (ZOVIRAX) 400 MG tablet Take 800 mg by mouth 2 (two) times daily.        Marland Kitchen aspirin (ASPIRIN EC) 81 MG EC tablet Take 1 tablet (81 mg total) by mouth daily. Swallow whole.  30 tablet  12  . atorvastatin (LIPITOR) 40 MG tablet Take 1 tablet (40 mg total) by mouth daily.  30 tablet  2  . brimonidine (ALPHAGAN) 0.2 % ophthalmic solution Place 1 drop into the right eye 2 (two) times daily at 10 AM and 5 PM.      . glucose blood (ONE TOUCH ULTRA TEST) test strip Test twice a day  100 each  0  . Hypromellose 0.3 % SOLN Apply 1 drop to eye 4 (four) times daily.  In LF eye.      . loteprednol (LOTEMAX) 0.5 % ophthalmic suspension Place 1 drop into the left eye 2 (two) times daily at 10 AM and 5 PM.      . metFORMIN (GLUCOPHAGE-XR) 500 MG 24 hr tablet Take 3 tablets (1,500 mg total) by mouth daily with breakfast.  90 tablet  2  . naproxen sodium (ANAPROX) 220 MG tablet Take 220 mg by mouth 2 (two) times daily with a meal.      . ONETOUCH DELICA LANCETS FINE MISC 1 each by Does not apply route 2 (two) times daily. Dx 249.50  100 each  2  . PROAIR HFA 108 (90 BASE) MCG/ACT inhaler       . timolol (BETIMOL) 0.25 % ophthalmic solution Place 1-2 drops into the right eye 2 (two) times daily.      . valACYclovir (VALTREX) 1000 MG tablet        No current facility-administered medications on file prior to visit.    No Known Allergies  Family History  Problem Relation Age of Onset  . Diabetes Father  BP 122/80  Ht 5\' 6"  (1.676 m)  Wt 159 lb (72.122 kg)  BMI 25.68 kg/m2  SpO2 96%  Review of Systems denies weight loss, chest pain, sob, urinary frequency, memory loss, depression, hypoglycemia, and easy bruising.  She has blurry vision, rhinorrhea, excessive diaphoresis, nausea, leg cramps, and headache.     Objective:   Physical Exam VS: see vs page GEN: no distress HEAD: head: no deformity eyes: no periorbital swelling, no proptosis external nose and ears are normal mouth: no lesion seen NECK: supple, thyroid is not enlarged CHEST WALL: no deformity LUNGS:  Clear to auscultation CV: reg rate and rhythm, no murmur ABD: abdomen is soft, nontender.  no hepatosplenomegaly.  not distended.  no hernia MUSCULOSKELETAL: muscle bulk and strength are grossly normal.  no obvious joint swelling.  gait is normal and steady EXTEMITIES: no deformity.  no ulcer on the feet.  feet are of normal color and temp.  no edema PULSES: dorsalis pedis intact bilat.  no carotid bruit NEURO:  cn 2-12 grossly intact.   readily moves all 4's.  sensation is intact to  touch on the feet SKIN:  Normal texture and temperature.  No rash or suspicious lesion is visible.   NODES:  None palpable at the neck PSYCH: alert, oriented x3.  Does not appear anxious nor depressed.   Lab Results  Component Value Date   HGBA1C 8.5* 03/15/2013      Assessment & Plan:  DM: she needs increased rx Myalgia.  This limits exercise rx of DM Nausea.  This could limit rx with [place], but i is reasonable to try.

## 2013-04-12 NOTE — Telephone Encounter (Signed)
Ok for both

## 2013-04-12 NOTE — Telephone Encounter (Signed)
Please advise      KP 

## 2013-04-12 NOTE — Telephone Encounter (Signed)
Patient's husband called stating pt needs pneumonia vaccine and hep b. Please review chart and advise if okay.

## 2013-04-17 ENCOUNTER — Ambulatory Visit: Payer: BC Managed Care – PPO

## 2013-04-19 ENCOUNTER — Telehealth: Payer: Self-pay | Admitting: Endocrinology

## 2013-04-19 NOTE — Telephone Encounter (Signed)
i called.  We updatted med list.

## 2013-04-19 NOTE — Telephone Encounter (Signed)
Dr Dagoberto Ligas would like for you to call her about this patienther Cell is [phone]

## 2013-04-29 ENCOUNTER — Ambulatory Visit: Payer: BC Managed Care – PPO

## 2013-05-01 ENCOUNTER — Ambulatory Visit (INDEPENDENT_AMBULATORY_CARE_PROVIDER_SITE_OTHER): Payer: BC Managed Care – PPO | Admitting: *Deleted

## 2013-05-01 DIAGNOSIS — Z23 Encounter for immunization: Secondary | ICD-10-CM

## 2013-06-28 ENCOUNTER — Other Ambulatory Visit: Payer: Self-pay | Admitting: Family Medicine

## 2013-07-16 ENCOUNTER — Ambulatory Visit: Payer: BC Managed Care – PPO | Admitting: Endocrinology

## 2013-07-16 DIAGNOSIS — Z0289 Encounter for other administrative examinations: Secondary | ICD-10-CM

## 2013-07-23 ENCOUNTER — Ambulatory Visit (INDEPENDENT_AMBULATORY_CARE_PROVIDER_SITE_OTHER): Payer: BC Managed Care – PPO | Admitting: Endocrinology

## 2013-07-23 DIAGNOSIS — E119 Type 2 diabetes mellitus without complications: Secondary | ICD-10-CM | POA: Insufficient documentation

## 2013-07-23 DIAGNOSIS — E1059 Type 1 diabetes mellitus with other circulatory complications: Secondary | ICD-10-CM

## 2013-07-23 DIAGNOSIS — Z23 Encounter for immunization: Secondary | ICD-10-CM

## 2013-07-23 NOTE — Progress Notes (Signed)
Subjective:    Patient ID: Claire Hill, female    DOB: 08-Sep-1971, 42 y.o.   MRN: [id number]  HPI pt returns for f/u of s 2 years h/o dm (dx'ed 2012; she has mild if any neuropathy of the lower extremities; she is unaware of any associated chronic complications; she has never been on insulin).   no cbg record, but states cbg's vary from 150-200.  She reports decreased frequency of menses Past Medical History  Diagnosis Date  . Herpes simplex of eye   . OCD (obsessive compulsive disorder)   . Hyperlipidemia   . Anemia   . Diabetes mellitus   . Asthma     pt states no-no inhalers  . Allergy     Past Surgical History  Procedure Laterality Date  . No past surgeries    . Mass excision  06/22/2012    Procedure: EXCISION MASS;  Surgeon: Shelly Rubenstein, MD;  Location: [facility];  Service: General;  Laterality: Right;  Excision chronic Right axillary cyst    History   Social History  . Marital Status: Married    Spouse Name: N/A    Number of Children: N/A  . Years of Education: N/A   Occupational History  . temp agency    Social History Main Topics  . Smoking status: Never Smoker   . Smokeless tobacco: Never Used  . Alcohol Use: No  . Drug Use: No  . Sexual Activity: Yes    Partners: Male   Other Topics Concern  . Not on file   Social History Narrative  . No narrative on file    Current Outpatient Prescriptions on File Prior to Visit  Medication Sig Dispense Refill  . aspirin (ASPIRIN EC) 81 MG EC tablet Take 1 tablet (81 mg total) by mouth daily. Swallow whole.  30 tablet  12  . atorvastatin (LIPITOR) 40 MG tablet TAKE 1 TABLET BY MOUTH EVERY DAY  30 tablet  0  . brimonidine-timolol (COMBIGAN) 0.2-0.5 % ophthalmic solution Place 1 drop into the right eye every 12 (twelve) hours.      Marland Kitchen glucose blood (ONE TOUCH ULTRA TEST) test strip Test twice a day  100 each  0  . Hypromellose 0.3 % SOLN Apply 1 drop to eye 4 (four) times daily. In LF  eye.      . loteprednol (LOTEMAX) 0.5 % ophthalmic suspension Place 1 drop into the left eye 2 (two) times daily at 10 AM and 5 PM.      . metFORMIN (GLUCOPHAGE-XR) 500 MG 24 hr tablet Take 3 tablets (1,500 mg total) by mouth daily with breakfast.  90 tablet  2  . naproxen sodium (ANAPROX) 220 MG tablet Take 220 mg by mouth 2 (two) times daily with a meal.      . ONETOUCH DELICA LANCETS FINE MISC 1 each by Does not apply route 2 (two) times daily. Dx 249.50  100 each  2  . PROAIR HFA 108 (90 BASE) MCG/ACT inhaler       . sitaGLIPtin (JANUVIA) 100 MG tablet Take 1 tablet (100 mg total) by mouth daily.  30 tablet  11  . valACYclovir (VALTREX) 1000 MG tablet        No current facility-administered medications on file prior to visit.    No Known Allergies  Family History  Problem Relation Age of Onset  . Diabetes Father   . Hypertension Mother     There were no vitals taken for this  visit.  Review of Systems denies weight loss and hypoglycemia    Objective:   Physical Exam VS: see vs page GEN: no distress  Lab Results  Component Value Date   HGBA1C 6.7* 07/23/2013      Assessment & Plan:  DM: well-controlled Decreased frequency of menses: not related to DM Dyslipidemia: lipitor has a minimal effect on glucose.

## 2013-07-23 NOTE — Patient Instructions (Addendum)
blood tests are being requested for you today.  We'll contact you with results. Please come back for a follow-up appointment in January.  (avs not given to pt, due to computer system outage).

## 2013-07-27 ENCOUNTER — Other Ambulatory Visit: Payer: Self-pay | Admitting: Family Medicine

## 2013-08-19 ENCOUNTER — Other Ambulatory Visit: Payer: Self-pay | Admitting: Family Medicine

## 2013-09-17 ENCOUNTER — Encounter: Payer: Self-pay | Admitting: Family Medicine

## 2013-09-17 ENCOUNTER — Ambulatory Visit (INDEPENDENT_AMBULATORY_CARE_PROVIDER_SITE_OTHER): Payer: Self-pay | Admitting: Family Medicine

## 2013-09-17 VITALS — BP 120/74 | HR 81 | Temp 97.7°F | Wt 160.0 lb

## 2013-09-17 DIAGNOSIS — J329 Chronic sinusitis, unspecified: Secondary | ICD-10-CM

## 2013-09-17 DIAGNOSIS — R059 Cough, unspecified: Secondary | ICD-10-CM

## 2013-09-17 DIAGNOSIS — R05 Cough: Secondary | ICD-10-CM

## 2013-09-17 MED ORDER — GUAIFENESIN-CODEINE 100-10 MG/5ML PO SYRP: ORAL_SOLUTION | ORAL | Status: DC

## 2013-09-17 MED ORDER — FLUTICASONE PROPIONATE 50 MCG/ACT NA SUSP: NASAL | Status: DC

## 2013-09-17 MED ORDER — CEFUROXIME AXETIL 500 MG PO TABS: 500.0000 mg | ORAL_TABLET | Freq: Two times a day (BID) | ORAL | Status: AC

## 2013-09-17 NOTE — Progress Notes (Signed)
Pre visit review using our clinic review tool, if applicable. No additional management support is needed unless otherwise documented below in the visit note. 

## 2013-09-17 NOTE — Patient Instructions (Signed)

## 2013-09-17 NOTE — Progress Notes (Signed)
  Subjective:     Claire Hill is a 42 y.o. female who presents for evaluation of sinus pain. Symptoms include: congestion, headaches, nasal congestion and sinus pressure. Onset of symptoms was 3 days ago. Symptoms have been gradually worsening since that time. Past history is significant for no history of pneumonia or bronchitis. Patient is a non-smoker.  The following portions of the patient's history were reviewed and updated as appropriate: allergies, current medications, past family history, past medical history, past social history, past surgical history and problem list.  Review of Systems Pertinent items are noted in HPI.   Objective:    BP 120/74  Pulse 81  Temp(Src) 97.7 F (36.5 C) (Tympanic)  Wt 160 lb (72.576 kg)  SpO2 96% General appearance: alert, cooperative, appears stated age and no distress Ears: normal TM's and external ear canals both ears Nose: green discharge, moderate congestion, turbinates red, swollen, sinus tenderness bilateral Throat: lips, mucosa, and tongue normal; teeth and gums normal Neck: moderate anterior cervical adenopathy, supple, symmetrical, trachea midline and thyroid not enlarged, symmetric, no tenderness/mass/nodules Lungs: clear to auscultation bilaterally Heart: S1, S2 normal    Assessment:    Acute bacterial sinusitis.    Plan:    Nasal steroids per medication orders. Antihistamines per medication orders. Ceftin per medication orders. f/u prn--cough med per orders

## 2013-11-22 ENCOUNTER — Ambulatory Visit (INDEPENDENT_AMBULATORY_CARE_PROVIDER_SITE_OTHER): Payer: BC Managed Care – PPO | Admitting: Endocrinology

## 2013-11-22 ENCOUNTER — Encounter: Payer: Self-pay | Admitting: Endocrinology

## 2013-11-22 VITALS — BP 114/64 | HR 89 | Temp 98.3°F | Ht 66.0 in | Wt 170.0 lb

## 2013-11-22 DIAGNOSIS — E119 Type 2 diabetes mellitus without complications: Secondary | ICD-10-CM

## 2013-11-22 LAB — HEMOGLOBIN A1C: HEMOGLOBIN A1C: 6.7 % — AB (ref 4.6–6.5)

## 2013-11-22 NOTE — Progress Notes (Signed)
Subjective:    Patient ID: Claire Hill, female    DOB: [birth year], 43 y.o.   MRN: [id number]  HPI pt returns for f/u of 2 years h/o dm (dx'ed 2012; she has mild if any neuropathy of the lower extremities; she is unaware of any associated chronic complications; she has never been on insulin).   no cbg record, but states cbg's are mostly in the mid-100's.  pt states she feels well in general.   Past Medical History  Diagnosis Date  . Herpes simplex of eye   . OCD (obsessive compulsive disorder)   . Hyperlipidemia   . Anemia   . Diabetes mellitus   . Asthma     pt states no-no inhalers  . Allergy     Past Surgical History  Procedure Laterality Date  . No past surgeries    . Mass excision  06/22/2012    Procedure: EXCISION MASS;  Surgeon: Harl Bowie, MD;  Location: [facility];  Service: General;  Laterality: Right;  Excision chronic Right axillary cyst    History   Social History  . Marital Status: Married    Spouse Name: N/A    Number of Children: N/A  . Years of Education: N/A   Occupational History  . temp agency    Social History Main Topics  . Smoking status: Never Smoker   . Smokeless tobacco: Never Used  . Alcohol Use: No  . Drug Use: No  . Sexual Activity: Yes    Partners: Male   Other Topics Concern  . Not on file   Social History Narrative  . No narrative on file    Current Outpatient Prescriptions on File Prior to Visit  Medication Sig Dispense Refill  . aspirin (ASPIRIN EC) 81 MG EC tablet Take 1 tablet (81 mg total) by mouth daily. Swallow whole.  30 tablet  12  . atorvastatin (LIPITOR) 40 MG tablet 1 tab by mouth daily-- labs are due now  30 tablet  0  . brimonidine-timolol (COMBIGAN) 0.2-0.5 % ophthalmic solution Place 1 drop into the right eye every 12 (twelve) hours.      . fluticasone (FLONASE) 50 MCG/ACT nasal spray 2 sprays each nostril qd  16 g  6  . guaiFENesin-codeine (ROBITUSSIN AC) 100-10 MG/5ML syrup 1-2  tsp po qhs prn cough  120 mL  0  . Hypromellose 0.3 % SOLN Apply 1 drop to eye 4 (four) times daily. In LF eye.      . loteprednol (LOTEMAX) 0.5 % ophthalmic suspension Place 1 drop into the left eye 2 (two) times daily at 10 AM and 5 PM.      . metFORMIN (GLUCOPHAGE-XR) 500 MG 24 hr tablet TAKE 3 TABLETS DAILY WITH BREAKFAST  90 tablet  2  . naproxen sodium (ANAPROX) 220 MG tablet Take 220 mg by mouth 2 (two) times daily with a meal.      . ONETOUCH DELICA LANCETS FINE MISC 1 each by Does not apply route 2 (two) times daily. Dx 249.50  100 each  2  . PROAIR HFA 108 (90 BASE) MCG/ACT inhaler       . sitaGLIPtin (JANUVIA) 100 MG tablet Take 1 tablet (100 mg total) by mouth daily.  30 tablet  11  . valACYclovir (VALTREX) 1000 MG tablet        No current facility-administered medications on file prior to visit.    No Known Allergies  Family History  Problem Relation Age of Onset  .  Diabetes Father   . Hypertension Mother     BP 114/64  Pulse 89  Temp(Src) 98.3 F (36.8 C) (Oral)  Ht 5\' 6"  (1.676 m)  Wt 170 lb (77.111 kg)  BMI 27.45 kg/m2  SpO2 99%  Review of Systems denies hypoglycemia and weight change    Objective:   Physical Exam VITAL SIGNS:  See vs page GENERAL: no distress   Lab Results  Component Value Date   HGBA1C 6.7* 11/22/2013       Assessment & Plan:  Type 2 DM: well-controlled. Obesity: this complicates the rx of DM.

## 2013-11-22 NOTE — Patient Instructions (Addendum)
blood tests are being requested for you today.  We'll contact you with results. If it is high, we'll add "nateglinide." Please come back for a follow-up appointment in 6 months.

## 2013-12-15 ENCOUNTER — Other Ambulatory Visit: Payer: Self-pay | Admitting: Family Medicine

## 2014-02-17 LAB — HM DIABETES EYE EXAM

## 2014-02-19 ENCOUNTER — Encounter: Payer: Self-pay | Admitting: Endocrinology

## 2014-02-19 ENCOUNTER — Telehealth: Payer: Self-pay | Admitting: Endocrinology

## 2014-02-19 NOTE — Telephone Encounter (Signed)
rec'd records from [facility] Forwarding 1 page to [place]

## 2014-03-04 ENCOUNTER — Encounter (HOSPITAL_COMMUNITY): Payer: Self-pay | Admitting: Emergency Medicine

## 2014-03-04 ENCOUNTER — Emergency Department (HOSPITAL_COMMUNITY)
Admission: EM | Admit: 2014-03-04 | Discharge: 2014-03-04 | Discharge status: Against Medical Advice | Payer: BC Managed Care – PPO | Attending: Emergency Medicine | Admitting: Emergency Medicine

## 2014-03-04 DIAGNOSIS — F429 Obsessive-compulsive disorder, unspecified: Secondary | ICD-10-CM | POA: Insufficient documentation

## 2014-03-04 DIAGNOSIS — E119 Type 2 diabetes mellitus without complications: Secondary | ICD-10-CM | POA: Insufficient documentation

## 2014-03-04 DIAGNOSIS — J45909 Unspecified asthma, uncomplicated: Secondary | ICD-10-CM | POA: Insufficient documentation

## 2014-03-04 NOTE — ED Notes (Signed)
Pt refuses to sit in triage chair.  pt is refusing vital sings.  Pt refuses to answer questions at this time .

## 2014-03-04 NOTE — ED Notes (Signed)
Pt brought in by husband, husband states he cannot deal with wife anymore. Husband states a bird is talking to pt about religious things, she is OCD. Pt states she does not need to be here.

## 2014-03-04 NOTE — ED Notes (Addendum)
PA went in to see pt, pt not in room. Armband on floor. Pt not in lobby or around dept.

## 2014-03-22 ENCOUNTER — Other Ambulatory Visit: Payer: Self-pay | Admitting: Family Medicine

## 2014-04-25 ENCOUNTER — Other Ambulatory Visit: Payer: Self-pay | Admitting: Endocrinology

## 2014-04-26 ENCOUNTER — Other Ambulatory Visit: Payer: Self-pay | Admitting: Family Medicine

## 2014-05-07 ENCOUNTER — Telehealth: Payer: Self-pay | Admitting: Endocrinology

## 2014-05-07 NOTE — Telephone Encounter (Signed)
Patients husband called stating that she would need a new [place] rx card He claims the pharmacy is declining the card they have now    Thank you

## 2014-05-07 NOTE — Telephone Encounter (Signed)
Pt advised via voicemail that discount card is ready for pick up. Card placed upfront.

## 2014-05-23 ENCOUNTER — Telehealth: Payer: Self-pay

## 2014-05-23 NOTE — Telephone Encounter (Signed)
Diabetic Bundle. Lvom for pt to call back and schedule appointment with Dr. Ellison.  

## 2014-06-04 ENCOUNTER — Other Ambulatory Visit: Payer: Self-pay | Admitting: Family Medicine

## 2014-06-10 ENCOUNTER — Telehealth: Payer: Self-pay

## 2014-06-10 NOTE — Telephone Encounter (Signed)
Spoke with patient and husband and we scheduled the DM Bundle appointment for tomorrow at 9:30 and the couple agreed.     KP

## 2014-06-11 ENCOUNTER — Ambulatory Visit (INDEPENDENT_AMBULATORY_CARE_PROVIDER_SITE_OTHER): Payer: Self-pay | Admitting: Family Medicine

## 2014-06-11 ENCOUNTER — Telehealth: Payer: Self-pay | Admitting: Endocrinology

## 2014-06-11 ENCOUNTER — Encounter: Payer: BC Managed Care – PPO | Admitting: Family Medicine

## 2014-06-11 ENCOUNTER — Encounter: Payer: Self-pay | Admitting: Family Medicine

## 2014-06-11 VITALS — BP 110/70 | HR 96 | Temp 98.0°F | Wt 176.0 lb

## 2014-06-11 DIAGNOSIS — IMO0002 Reserved for concepts with insufficient information to code with codable children: Secondary | ICD-10-CM

## 2014-06-11 DIAGNOSIS — E1165 Type 2 diabetes mellitus with hyperglycemia: Secondary | ICD-10-CM

## 2014-06-11 DIAGNOSIS — E785 Hyperlipidemia, unspecified: Secondary | ICD-10-CM

## 2014-06-11 DIAGNOSIS — I10 Essential (primary) hypertension: Secondary | ICD-10-CM

## 2014-06-11 DIAGNOSIS — E118 Type 2 diabetes mellitus with unspecified complications: Secondary | ICD-10-CM

## 2014-06-11 MED ORDER — GLUCOSE BLOOD VI STRP: ORAL_STRIP | Status: DC

## 2014-06-11 NOTE — Patient Instructions (Signed)
Diabetes and Standards of Medical Care Diabetes is complicated. You may find that your diabetes team includes a dietitian, nurse, diabetes educator, eye doctor, and more. To help everyone know what is going on and to help you get the care you deserve, the following schedule of care was developed to help keep you on track. Below are the tests, exams, vaccines, medicines, education, and plans you will need. HbA1c test This test shows how well you have controlled your glucose over the past 2-3 months. It is used to see if your diabetes management plan needs to be adjusted.   It is performed at least 2 times a year if you are meeting treatment goals.  It is performed 4 times a year if therapy has changed or if you are not meeting treatment goals. Blood pressure test  This test is performed at every routine medical visit. The goal is less than 140/90 mm Hg for most people, but 130/80 mm Hg in some cases. Ask your health care provider about your goal. Dental exam  Follow up with the dentist regularly. Eye exam  If you are diagnosed with type 1 diabetes as a child, get an exam upon reaching the age of 43 years or older and have had diabetes for 3-5 years. Yearly eye exams are recommended after that initial eye exam.  If you are diagnosed with type 1 diabetes as an adult, get an exam within 5 years of diagnosis and then yearly.  If you are diagnosed with type 2 diabetes, get an exam as soon as possible after the diagnosis and then yearly. Foot care exam  Visual foot exams are performed at every routine medical visit. The exams check for cuts, injuries, or other problems with the feet.  A comprehensive foot exam should be done yearly. This includes visual inspection as well as assessing foot pulses and testing for loss of sensation.  Check your feet nightly for cuts, injuries, or other problems with your feet. Tell your health care provider if anything is not healing. Kidney function test (urine  microalbumin)  This test is performed once a year.  Type 1 diabetes: The first test is performed 5 years after diagnosis.  Type 2 diabetes: The first test is performed at the time of diagnosis.  A serum creatinine and estimated glomerular filtration rate (eGFR) test is done once a year to assess the level of chronic kidney disease (CKD), if present. Lipid profile (cholesterol, HDL, LDL, triglycerides)  Performed every 5 years for most people.  The goal for LDL is less than 100 mg/dL. If you are at high risk, the goal is less than 70 mg/dL.  The goal for HDL is 40 mg/dL-50 mg/dL for men and 50 mg/dL-60 mg/dL for women. An HDL cholesterol of 60 mg/dL or higher gives some protection against heart disease.  The goal for triglycerides is less than 150 mg/dL. Influenza vaccine, pneumococcal vaccine, and hepatitis B vaccine  The influenza vaccine is recommended yearly.  It is recommended that people with diabetes who are over 24 years old get the pneumonia vaccine. In some cases, two separate shots may be given. Ask your health care provider if your pneumonia vaccination is up to date.  The hepatitis B vaccine is also recommended for adults with diabetes. Diabetes self-management education  Education is recommended at diagnosis and ongoing as needed. Treatment plan  Your treatment plan is reviewed at every medical visit. Document Released: 08/07/2009 Document Revised: 02/24/2014 Document Reviewed: 03/12/2013 [facility] Patient Information 2015 [place],  [employer]. This information is not intended to replace advice given to you by your health care provider. Make sure you discuss any questions you have with your health care provider.  

## 2014-06-11 NOTE — Telephone Encounter (Signed)
Patient husband called and would like to know if you can send an Rx similar to the [place] is too expensive   Please advise patient   Thank you

## 2014-06-11 NOTE — Telephone Encounter (Signed)
It has been a while since last ov.  Please come in, and we'll list off you options, and discuss together.

## 2014-06-11 NOTE — Telephone Encounter (Signed)
See below and please advise, Thanks!  

## 2014-06-11 NOTE — Progress Notes (Signed)
   Subjective:    Patient ID: Claire Hill, female    DOB: September 30, 1971, 43 y.o.   MRN: [id number]  HPI  DIABETES  Blood Sugar ranges-109-146  Polyuria- no New Visual problems- no Hypoglycemic symptoms- no Other side effects-no Medication compliance - good Last eye exam- 05/28/2014 Foot exam- today  HYPERLIPIDEMIA  Medication compliance- good RUQ pain- no  Muscle aches- no Other side effects-no       Review of Systems As above     Objective:   Physical Exam BP 110/70  Pulse 96  Temp(Src) 98 F (36.7 C) (Oral)  Wt 176 lb (79.833 kg)  SpO2 96% General appearance: alert, cooperative, appears stated age and no distress Nose: Nares normal. Septum midline. Mucosa normal. No drainage or sinus tenderness. Throat: lips, mucosa, and tongue normal; teeth and gums normal Neck: no adenopathy, supple, symmetrical, trachea midline and thyroid not enlarged, symmetric, no tenderness/mass/nodules Lungs: clear to auscultation bilaterally Heart: S1, S2 normal Extremities: extremities normal, atraumatic, no cyanosis or edema       Assessment & Plan:  1. Type II or unspecified type diabetes mellitus with unspecified complication, uncontrolled con't meds, check labs - glucose blood (ONE TOUCH ULTRA TEST) test strip; Test blood sugar twice daily  Dispense: 100 each; Refill: 11 - Basic metabolic panel; Future - Hemoglobin A1c; Future - Microalbumin / creatinine urine ratio; Future - POCT urinalysis dipstick; Future  2. Other and unspecified hyperlipidemia Check labs - glucose blood (ONE TOUCH ULTRA TEST) test strip; Test blood sugar twice daily  Dispense: 100 each; Refill: 11 - Hepatic function panel; Future - Lipid panel; Future - Microalbumin / creatinine urine ratio; Future - POCT urinalysis dipstick; Future

## 2014-06-11 NOTE — Progress Notes (Signed)
Pre visit review using our clinic review tool, if applicable. No additional management support is needed unless otherwise documented below in the visit note. 

## 2014-06-12 NOTE — Telephone Encounter (Signed)
Lvom informing pt that Dr. Loanne Drilling would like to see pt before changing medication. Pt has an appointment on 06/17/2014. Med changes will be discussed then.

## 2014-06-13 ENCOUNTER — Other Ambulatory Visit: Payer: Self-pay

## 2014-06-17 ENCOUNTER — Ambulatory Visit (INDEPENDENT_AMBULATORY_CARE_PROVIDER_SITE_OTHER): Payer: BC Managed Care – PPO | Admitting: Endocrinology

## 2014-06-17 ENCOUNTER — Encounter: Payer: Self-pay | Admitting: Endocrinology

## 2014-06-17 VITALS — BP 114/66 | HR 96 | Temp 98.1°F | Wt 175.0 lb

## 2014-06-17 DIAGNOSIS — E119 Type 2 diabetes mellitus without complications: Secondary | ICD-10-CM

## 2014-06-17 LAB — MICROALBUMIN / CREATININE URINE RATIO
Creatinine,U: 39.6 mg/dL
MICROALB/CREAT RATIO: 1.8 mg/g (ref 0.0–30.0)
Microalb, Ur: 0.7 mg/dL (ref 0.0–1.9)

## 2014-06-17 LAB — HEMOGLOBIN A1C: Hgb A1c MFr Bld: 7 % — ABNORMAL HIGH (ref 4.6–6.5)

## 2014-06-17 NOTE — Patient Instructions (Addendum)
blood tests are being requested for you today.  We'll contact you with results. Based on the results, i'll prescribe for you an alternative for the januvia Please continue the same metformin.  Please come back for a follow-up appointment in 3 months.

## 2014-06-17 NOTE — Progress Notes (Signed)
Subjective:    Patient ID: Claire Hill, female    DOB: 1971-08-11, 43 y.o.   MRN: [id number]  HPI pt returns for f/u of 2 years h/o dm (dx'ed 2012; no chronic complications; she has never been on insulin; she takes 2 oral meds).   She lost her insurance.  She take 2 DM meds as rx'ed.  no cbg record, but states cbg's are mostly in the mid-100's.  pt states she feels well in general.  Past Medical History  Diagnosis Date  . Herpes simplex of eye   . OCD (obsessive compulsive disorder)   . Hyperlipidemia   . Anemia   . Diabetes mellitus   . Asthma     pt states no-no inhalers  . Allergy     Past Surgical History  Procedure Laterality Date  . No past surgeries    . Mass excision  06/22/2012    Procedure: EXCISION MASS;  Surgeon: Harl Bowie, MD;  Location: [facility];  Service: General;  Laterality: Right;  Excision chronic Right axillary cyst    History   Social History  . Marital Status: Married    Spouse Name: N/A    Number of Children: N/A  . Years of Education: N/A   Occupational History  . temp agency    Social History Main Topics  . Smoking status: Never Smoker   . Smokeless tobacco: Never Used  . Alcohol Use: No  . Drug Use: No  . Sexual Activity: Yes    Partners: Male   Other Topics Concern  . Not on file   Social History Narrative  . No narrative on file    Current Outpatient Prescriptions on File Prior to Visit  Medication Sig Dispense Refill  . aspirin (ASPIRIN EC) 81 MG EC tablet Take 1 tablet (81 mg total) by mouth daily. Swallow whole.  30 tablet  12  . atorvastatin (LIPITOR) 40 MG tablet 1 tab by mouth daily-- labs are due now  30 tablet  0  . brimonidine-timolol (COMBIGAN) 0.2-0.5 % ophthalmic solution Place 1 drop into the right eye every 12 (twelve) hours.      Marland Kitchen glucose blood (ONE TOUCH ULTRA TEST) test strip Test blood sugar twice daily  100 each  11  . metFORMIN (GLUCOPHAGE-XR) 500 MG 24 hr tablet TAKE 3  TABLET BY MOUTH DAILY WITH BREAKFAST  90 tablet  1  . ONETOUCH DELICA LANCETS FINE MISC 1 each by Does not apply route 2 (two) times daily. Dx 249.50  100 each  2  . PROAIR HFA 108 (90 BASE) MCG/ACT inhaler        No current facility-administered medications on file prior to visit.    No Known Allergies  Family History  Problem Relation Age of Onset  . Diabetes Father   . Hypertension Mother     BP 114/66  Pulse 96  Temp(Src) 98.1 F (36.7 C) (Oral)  Wt 175 lb (79.379 kg)  SpO2 95%   Review of Systems Denies weight change and hypoglycemia.      Objective:   Physical Exam VITAL SIGNS:  See vs page GENERAL: no distress Pulses: dorsalis pedis intact bilat.   Feet: no deformity. normal color and temp.  no edema.   Skin:  no ulcer on the feet.   Neuro: sensation is intact to touch on the feet.        Assessment & Plan:  Noncompliance with f/u ov: I'll work around this as best  I can   Patient is advised the following: Patient Instructions  blood tests are being requested for you today.  We'll contact you with results. Based on the results, i'll prescribe for you an alternative for the januvia Please continue the same metformin.  Please come back for a follow-up appointment in 3 months.

## 2014-06-18 ENCOUNTER — Other Ambulatory Visit: Payer: Self-pay | Admitting: Endocrinology

## 2014-06-18 MED ORDER — GLIMEPIRIDE 1 MG PO TABS: 1.0000 mg | ORAL_TABLET | Freq: Every day | ORAL | Status: DC

## 2014-07-07 ENCOUNTER — Other Ambulatory Visit: Payer: Self-pay | Admitting: Family Medicine

## 2014-08-21 ENCOUNTER — Other Ambulatory Visit: Payer: Self-pay

## 2014-08-21 MED ORDER — METFORMIN HCL ER 500 MG PO TB24: 1500.0000 mg | ORAL_TABLET | Freq: Every day | ORAL | Status: DC

## 2014-09-15 ENCOUNTER — Ambulatory Visit: Payer: Self-pay | Admitting: Endocrinology

## 2014-09-16 ENCOUNTER — Telehealth: Payer: Self-pay | Admitting: Endocrinology

## 2014-09-16 NOTE — Telephone Encounter (Signed)
No show letter sent.

## 2014-09-16 NOTE — Telephone Encounter (Signed)
Follow up advised. Contact patient and schedule visit in 2 months. 

## 2014-09-16 NOTE — Telephone Encounter (Signed)
Patient no showed today's appt. Please advise on how to follow up. °A. No follow up necessary. °B. Follow up urgent. Contact patient immediately. °C. Follow up necessary. Contact patient and schedule visit in ___ days. °D. Follow up advised. Contact patient and schedule visit in ____weeks. ° °

## 2014-09-27 ENCOUNTER — Other Ambulatory Visit: Payer: Self-pay | Admitting: Family Medicine

## 2014-11-07 ENCOUNTER — Encounter: Payer: Self-pay | Admitting: Endocrinology

## 2014-11-07 ENCOUNTER — Ambulatory Visit (INDEPENDENT_AMBULATORY_CARE_PROVIDER_SITE_OTHER): Payer: 59 | Admitting: Endocrinology

## 2014-11-07 VITALS — BP 128/82 | HR 102 | Temp 98.2°F | Ht 66.0 in | Wt 182.0 lb

## 2014-11-07 DIAGNOSIS — E119 Type 2 diabetes mellitus without complications: Secondary | ICD-10-CM

## 2014-11-07 DIAGNOSIS — R200 Anesthesia of skin: Secondary | ICD-10-CM

## 2014-11-07 MED ORDER — GLIMEPIRIDE 1 MG PO TABS: 1.0000 mg | ORAL_TABLET | Freq: Every day | ORAL | Status: DC

## 2014-11-07 MED ORDER — METFORMIN HCL ER 500 MG PO TB24: 500.0000 mg | ORAL_TABLET | Freq: Four times a day (QID) | ORAL | Status: DC

## 2014-11-07 NOTE — Patient Instructions (Addendum)
blood tests are being requested for you today.  We'll contact you with results.  If the blood sugar is high, we can add onglyza, invokana, or bromocriptine.  check your blood sugar once a day.  vary the time of day when you check, between before the 3 meals, and at bedtime.  also check if you have symptoms of your blood sugar being too high or too low.  please keep a record of the readings and bring it to your next appointment here.  You can write it on any piece of paper.  please call us sooner if your blood sugar goes below 70, or if you have a lot of readings over 200. Please come back for a follow-up appointment in 3 months.

## 2014-11-07 NOTE — Progress Notes (Signed)
Subjective:    Patient ID: Claire Hill, female    DOB: 1971-07-18, 44 y.o.   MRN: [id number]  HPI  Pt returns for f/u of diabetes mellitus: DM type: 2 Dx'ed: 2536 Complications: none Therapy: 2 oral meds GDM: never DKA: never Severe hypoglycemia: never Pancreatitis: never Other: she has never been on insulin. Interval history: She has regained her health insurance.  pt states she feels well in general.  she brings a record of her cbg's which i have reviewed today.  It varies from 150-400, but most are in the 200's.  She has slight numbness of the feet, but no assoc pain.   Past Medical History  Diagnosis Date  . Herpes simplex of eye   . OCD (obsessive compulsive disorder)   . Hyperlipidemia   . Anemia   . Diabetes mellitus   . Asthma     pt states no-no inhalers  . Allergy     Past Surgical History  Procedure Laterality Date  . No past surgeries    . Mass excision  06/22/2012    Procedure: EXCISION MASS;  Surgeon: Harl Bowie, MD;  Location: [facility];  Service: General;  Laterality: Right;  Excision chronic Right axillary cyst    History   Social History  . Marital Status: Married    Spouse Name: N/A    Number of Children: N/A  . Years of Education: N/A   Occupational History  . temp agency    Social History Main Topics  . Smoking status: Never Smoker   . Smokeless tobacco: Never Used  . Alcohol Use: No  . Drug Use: No  . Sexual Activity:    Partners: Male   Other Topics Concern  . Not on file   Social History Narrative    Current Outpatient Prescriptions on File Prior to Visit  Medication Sig Dispense Refill  . aspirin (ASPIRIN EC) 81 MG EC tablet Take 1 tablet (81 mg total) by mouth daily. Swallow whole. 30 tablet 12  . atorvastatin (LIPITOR) 40 MG tablet 1 tab by mouth daily-- labs are due now 30 tablet 0  . brimonidine-timolol (COMBIGAN) 0.2-0.5 % ophthalmic solution Place 1 drop into the right eye every 12  (twelve) hours.    Marland Kitchen glucose blood (ONE TOUCH ULTRA TEST) test strip Test blood sugar twice daily 100 each 11  . ONETOUCH DELICA LANCETS FINE MISC 1 each by Does not apply route 2 (two) times daily. Dx 249.50 100 each 2  . PROAIR HFA 108 (90 BASE) MCG/ACT inhaler      No current facility-administered medications on file prior to visit.    No Known Allergies  Family History  Problem Relation Age of Onset  . Diabetes Father   . Hypertension Mother     BP 128/82 mmHg  Pulse 102  Temp(Src) 98.2 F (36.8 C) (Oral)  Ht 5\' 6"  (1.676 m)  Wt 182 lb (82.555 kg)  BMI 29.39 kg/m2  SpO2 94%    Review of Systems She denies hypoglycemia and weight change    Objective:   Physical Exam VITAL SIGNS:  See vs page GENERAL: no distress Pulses: dorsalis pedis intact bilat.   MSK: no deformity of the feet CV: no leg edema Skin:  no ulcer on the feet.  normal color and temp on the feet. Neuro: sensation is intact to touch on the feet.       Assessment & Plan:  DM: moderate exacerbation Numbness, new, uncertain etiology, prob  due to DM.   Patient is advised the following: Patient Instructions  blood tests are being requested for you today.  We'll contact you with results.  If the blood sugar is high, we can add onglyza, invokana, or bromocriptine.  check your blood sugar once a day.  vary the time of day when you check, between before the 3 meals, and at bedtime.  also check if you have symptoms of your blood sugar being too high or too low.  please keep a record of the readings and bring it to your next appointment here.  You can write it on any piece of paper.  please call us sooner if your blood sugar goes below 70, or if you have a lot of readings over 200. Please come back for a follow-up appointment in 3 months.

## 2015-02-06 ENCOUNTER — Ambulatory Visit: Payer: 59 | Admitting: Endocrinology

## 2015-02-06 DIAGNOSIS — Z0289 Encounter for other administrative examinations: Secondary | ICD-10-CM

## 2015-08-30 ENCOUNTER — Encounter (HOSPITAL_COMMUNITY): Payer: Self-pay | Admitting: Emergency Medicine

## 2015-08-30 ENCOUNTER — Emergency Department (HOSPITAL_COMMUNITY)
Admission: EM | Admit: 2015-08-30 | Discharge: 2015-08-30 | Disposition: A | Discharge status: Home / Self Care | Payer: Self-pay | Attending: Emergency Medicine | Admitting: Emergency Medicine

## 2015-08-30 ENCOUNTER — Emergency Department (HOSPITAL_COMMUNITY): Payer: Self-pay

## 2015-08-30 DIAGNOSIS — Z8619 Personal history of other infectious and parasitic diseases: Secondary | ICD-10-CM | POA: Insufficient documentation

## 2015-08-30 DIAGNOSIS — J40 Bronchitis, not specified as acute or chronic: Secondary | ICD-10-CM

## 2015-08-30 DIAGNOSIS — Z8669 Personal history of other diseases of the nervous system and sense organs: Secondary | ICD-10-CM | POA: Insufficient documentation

## 2015-08-30 DIAGNOSIS — E119 Type 2 diabetes mellitus without complications: Secondary | ICD-10-CM | POA: Insufficient documentation

## 2015-08-30 DIAGNOSIS — J449 Chronic obstructive pulmonary disease, unspecified: Secondary | ICD-10-CM | POA: Insufficient documentation

## 2015-08-30 DIAGNOSIS — Z79899 Other long term (current) drug therapy: Secondary | ICD-10-CM | POA: Insufficient documentation

## 2015-08-30 HISTORY — DX: Chronic obstructive pulmonary disease, unspecified: J44.9

## 2015-08-30 HISTORY — DX: Type 2 diabetes mellitus without complications: E11.9

## 2015-08-30 HISTORY — DX: Herpesviral infection, unspecified: B00.9

## 2015-08-30 HISTORY — DX: Unspecified glaucoma: H40.9

## 2015-08-30 MED ORDER — HYDROCOD POLST-CPM POLST ER 10-8 MG/5ML PO SUER: 5.0000 mL | Freq: Every evening | ORAL | Status: DC | PRN

## 2015-08-30 MED ORDER — GUAIFENESIN ER 600 MG PO TB12: 1200.0000 mg | ORAL_TABLET | Freq: Two times a day (BID) | ORAL | Status: DC | PRN

## 2015-08-30 MED ORDER — ALBUTEROL SULFATE (2.5 MG/3ML) 0.083% IN NEBU
5.0000 mg | INHALATION_SOLUTION | Freq: Once | RESPIRATORY_TRACT | Status: AC
Start: 2015-08-30 — End: 2015-08-30
  Administered 2015-08-30: 5 mg via RESPIRATORY_TRACT

## 2015-08-30 MED ORDER — IPRATROPIUM BROMIDE 0.02 % IN SOLN
0.5000 mg | Freq: Once | RESPIRATORY_TRACT | Status: AC
  Administered 2015-08-30: 0.5 mg via RESPIRATORY_TRACT

## 2015-08-30 MED ORDER — ALBUTEROL SULFATE HFA 108 (90 BASE) MCG/ACT IN AERS
2.0000 | INHALATION_SPRAY | RESPIRATORY_TRACT | Status: DC | PRN
  Administered 2015-08-30: 2 via RESPIRATORY_TRACT
  Filled 2015-08-30: qty 6.7

## 2015-08-30 MED ORDER — PREDNISONE 10 MG PO TABS: ORAL_TABLET | ORAL | Status: DC

## 2015-08-30 NOTE — ED Notes (Signed)
Patient has had a persistent cough x 4 days. Patient stated she is having yellow and green sputum. Patient denies any nausea or vomiting. Patient states she cant quit coughing.

## 2015-08-30 NOTE — ED Notes (Signed)
Peak flow 150 per Respiratory

## 2015-08-30 NOTE — Progress Notes (Signed)
Peak flow performed X's 4 with fair effort.  Pt blew 150 with best try, RT to monitor and assess as needed.

## 2015-08-30 NOTE — ED Provider Notes (Signed)
CSN: [id number]     Arrival date & time 08/30/15  0145 History   First MD Initiated Contact with Patient 08/30/15 0224     Chief Complaint  Patient presents with  . Cough     (Consider location/radiation/quality/duration/timing/severity/associated sxs/prior Treatment) Patient is a 44 y.o. female presenting with cough. The history is provided by the patient. No language interpreter was used.  Cough Cough characteristics:  Productive Severity:  Moderate Onset quality:  Gradual Duration:  4 days Chronicity:  Recurrent Smoker: no   Associated symptoms: no chills, no fever and no shortness of breath   Associated symptoms comment:  Patient presents with complaint of cough for the past 4 days, now productive. No fever. She has a history of recurrent bronchitis. She has had inhalers in the past but does not have one at present. She reports cough is worse at night when she lies down.  No vomiting. No significant sinus or nasal congestion. She is a non-smoker.    Past Medical History  Diagnosis Date  . Diabetes mellitus without complication ([place])   . Herpes     Right eye  . COPD (chronic obstructive pulmonary disease) ([place])   . Glaucoma    No past surgical history on file. No family history on file. Social History  Substance Use Topics  . Smoking status: Never Smoker   . Smokeless tobacco: Never Used  . Alcohol Use: No   OB History    No data available     Review of Systems  Constitutional: Negative for fever and chills.  HENT: Negative.   Respiratory: Positive for cough. Negative for shortness of breath.   Cardiovascular: Negative.   Gastrointestinal: Negative.   Musculoskeletal: Negative.   Skin: Negative.   Neurological: Negative.       Allergies  Review of patient's allergies indicates no known allergies.  Home Medications   Prior to Admission medications   Medication Sig Start Date End Date Taking? Authorizing Provider  albuterol (PROVENTIL HFA;VENTOLIN HFA)  108 (90 BASE) MCG/ACT inhaler Inhale 1-2 puffs into the lungs every 6 (six) hours as needed for wheezing or shortness of breath.   Yes Historical Provider, MD  ibuprofen (ADVIL,MOTRIN) 200 MG tablet Take 400-600 mg by mouth every 6 (six) hours as needed (for shortness of breath).   Yes Historical Provider, MD  metFORMIN (GLUCOPHAGE-XR) 500 MG 24 hr tablet Take 500 mg by mouth 4 (four) times daily.   Yes Historical Provider, MD  valACYclovir (VALTREX) 1000 MG tablet Take 1,000 mg by mouth daily.   Yes Historical Provider, MD  chlorpheniramine-HYDROcodone (TUSSIONEX PENNKINETIC ER) 10-8 MG/5ML SUER Take 5 mLs by mouth at bedtime as needed for cough. 08/30/15   Charlann Lange, PA-C  guaiFENesin (MUCINEX) 600 MG 12 hr tablet Take 2 tablets (1,200 mg total) by mouth 2 (two) times daily as needed (for cold symptoms). 08/30/15   Charlann Lange, PA-C  predniSONE (DELTASONE) 10 MG tablet Take 6 on day 1 Take 5 on day 2 Take 4 on day 3 Take 3 on day 4 Take 2 on day 5 Take 1 on day 6 08/30/15   Nehemiah Settle Lazaro Isenhower, PA-C   BP 127/75 mmHg  Pulse 95  Temp(Src) 98.3 F (36.8 C) (Oral)  Resp 20  SpO2 98% Physical Exam  Constitutional: She is oriented to person, place, and time. She appears well-developed and well-nourished. No distress.  Neck: Normal range of motion.  Cardiovascular: Normal rate.   Pulmonary/Chest: Effort normal. She has no wheezes. She has no  rales.  Musculoskeletal: Normal range of motion.  Neurological: She is oriented to person, place, and time.  Skin: Skin is warm and dry.  Psychiatric: She has a normal mood and affect.    ED Course  Procedures (including critical care time) Labs Review Labs Reviewed - No data to display  Imaging Review Dg Chest 2 View  08/30/2015  CLINICAL DATA:  Cough, scratchy throat, chest pain for 4 days, shortness of breath for 1 week. History of COPD, diabetes. EXAM: CHEST  2 VIEW COMPARISON:  Chest radiograph September 02, 2004 FINDINGS: Cardiomediastinal  silhouette is normal. Mild bronchitic changes. The lungs are clear without pleural effusions or focal consolidations. Trachea projects midline and there is no pneumothorax. Soft tissue planes and included osseous structures are non-suspicious. IMPRESSION: Mild bronchitic changes without focal consolidation. Electronically Signed   By: Elon Alas M.D.   On: 08/30/2015 03:49   I have personally reviewed and evaluated these images and lab results as part of my medical decision-making.   EKG Interpretation None      MDM   Final diagnoses:  Bronchitis    She is well appearing. VSS. No hypoxia and no evidence PNA on CXR. Will treat symptomatically with Mucinex, prednisone and albuterol inhaler. Patient and husband request pulmonology referral for management recurrent bronchitis.     Charlann Lange, PA-C 08/30/15 1916  April Palumbo, MD 08/30/15 [phone]

## 2015-08-30 NOTE — Discharge Instructions (Signed)

## 2015-09-10 ENCOUNTER — Ambulatory Visit (INDEPENDENT_AMBULATORY_CARE_PROVIDER_SITE_OTHER): Payer: Self-pay | Admitting: Internal Medicine

## 2015-09-10 ENCOUNTER — Encounter: Payer: Self-pay | Admitting: Internal Medicine

## 2015-09-10 VITALS — BP 110/70 | HR 98 | Ht 68.0 in | Wt 164.0 lb

## 2015-09-10 DIAGNOSIS — J45991 Cough variant asthma: Secondary | ICD-10-CM | POA: Insufficient documentation

## 2015-09-10 MED ORDER — BUDESONIDE-FORMOTEROL FUMARATE 80-4.5 MCG/ACT IN AERO: INHALATION_SPRAY | RESPIRATORY_TRACT | Status: DC

## 2015-09-10 NOTE — Assessment & Plan Note (Addendum)
She sounds more like acute intermittent asthma than chronic asthma though with hx of recurrent flares since childhood could have chronic asthma or Even ACOS - whatever we call it, it is poorly controlled  DDX of  difficult airways management all start with A and  include Adherence, Ace Inhibitors, Acid Reflux, Active Sinus Disease, Alpha 1 Antitripsin deficiency, Anxiety masquerading as Airways dz,  ABPA,  allergy(esp in young), Aspiration (esp in elderly), Adverse effects of meds,  Active smokers, A bunch of PE's (a small clot burden can't cause this syndrome unless there is already severe underlying pulm or vascular dz with poor reserve) plus two Bs  = Bronchiectasis and Beta blocker use..and one C= CHF  Adherence is always the initial "prime suspect" and is a multilayered concern that requires a "trust but verify" approach in every patient - starting with knowing how to use medications, especially inhalers, correctly, keeping up with refills and understanding the fundamental difference between maintenance and prns vs those medications only taken for a very short course and then stopped and not refilled.  - The proper method of use, as well as anticipated side effects, of a metered-dose inhaler are discussed and demonstrated to the patient. Improved effectiveness after extensive coaching during this visit to a level of approximately  75% from a baseline of < 25%  So try symbicort 80 2bid   ? Acid (or non-acid) GERD > always difficult to exclude as up to 75% of pts in some series report no assoc GI/ Heartburn symptoms> rec diet restrictions/ reviewed and instructions given in writing in spanish and if not better add acid suppression next  ? Allergy > no hx to support   ? Active sinus dz > sinus ct next "cold" to see if contributing if the frequency of these flares continues while on maint rx for asthma    I had an extended discussion with the patient and husband reviewing all relevant studies  completed to date and  lasting 35 minutes of a 60 minute visit    Each maintenance medication was reviewed in detail including most importantly the difference between maintenance and prns and under what circumstances the prns are to be triggered using an action plan format that is not reflected in the computer generated alphabetically organized AVS.    Please see instructions for details which were reviewed in writing in Spanish  and the patient given a copy highlighting the part that I personally wrote and discussed at today's ov.

## 2015-09-10 NOTE — Patient Instructions (Addendum)
Plan A = automatic = symbicort 80 Take 2 puffs first thing in am and then another 2 puffs about 12 hours later.   Plan B = Backup - Only use your albuterol(yellow inhaler/ proventil)  as a rescue medication to be used if you can't catch your breath by resting or doing a relaxed purse lip breathing pattern.  - The less you use it, the better it will work when you need it. - Ok to use up to 2 puffs  every 4 hours if you must but call for immediate appointment if use goes up over your usual need - Don't leave home without it !!  (think of it like the spare tire for your car)   Work on inhaler technique:  relax and gently blow all the way out then take a nice smooth deep breath back in, triggering the inhaler at same time you start breathing in.  Hold for up to 5 seconds if you can. Blow out thru nose. Rinse and gargle with water when done  GERD (REFLUX)  is an extremely common cause of respiratory symptoms just like yours , many times with no obvious heartburn at all.    It can be treated with medication, but also with lifestyle changes including elevation of the head of your bed (ideally with 6 inch  bed blocks),  Smoking cessation, avoidance of late meals, excessive alcohol, and avoid fatty foods, chocolate, peppermint, colas, red wine, and acidic juices such as orange juice.  NO MINT OR MENTHOL PRODUCTS SO NO COUGH DROPS  USE SUGARLESS CANDY INSTEAD (Jolley ranchers or Stover's or Life Savers) or even ice chips will also do - the key is to swallow to prevent all throat clearing. NO OIL BASED VITAMINS - use powdered substitutes.    If not improving Try prilosec otc 20mg   Take 30-60 min before first meal of the day and Pepcid ac (famotidine) 20 mg one @  bedtime until cough is completely gone for at least a week without the need for cough suppression     Please schedule a follow up office visit in 6 weeks, call sooner if needed with pfts     Plan A = automatic = symbicort 80 Tome 2 bocanadas  primera vez en la maana y luego otra 2 bocanadas aproximadamente 12 horas despus.  Plan B = Copia de seguridad - Utilice solamente su albuterol (inhalador amarillo / proventil) como medicacin de rescate para ser utilizado si usted no puede coger su respiracin descansando o haciendo un patrn de respiracin relajado del labio del monedero. - Cuanto menos lo utilice, mejor funcionar cuando lo necesite. - Ok para usar hasta 2 bocanadas cada 4 horas si es necesario, pero llamar para una cita inmediata si el uso sube por encima de su necesidad habitual - No salgas de casa sin ella! (Piense en l como la rueda de repuesto para su coche)  Trabaje en la tcnica del inhalador: reljese y suavemente soplar todo el camino hacia fuera entonces toma una respiracin profunda lisa agradable detrs adentro, activando el inhalador en el mismo tiempo que usted comienza a Marketing executive. Sostngase por hasta 5 segundos si usted puede. Soplar a travs de Mudlogger. Enjuague y haga grgaras con agua cuando termine  GERD (REFLUX) es una causa extremadamente comn de sntomas respiratorios como el suyo, muchas veces sin Conservation officer, nature.  Se puede tratar con medicamentos, pero tambin con cambios en el estilo de vida incluyendo la elevacin de la cabeza de su  cama (idealmente con bloques de 6 pulgadas de la cama), dejar de fumar, evitar comidas tardas, alcohol excesivo y evitar alimentos grasos, chocolate, hierbabuena, colas , Vino tinto y jugos cidos como el zumo de [place]. NO HAY PRODUCTOS MENTA O MENTHOL, POR LO QUE NO SE DEBEN TOCAR  USAR CANDY SIN SUGARLES EN LUGAR (rancheros Jolley o Stover's o Life Savers) o incluso trozos de hielo tambin lo harn - la clave es tragar para prevenir todo despeje de garganta. NO VITAMINAS BASADAS EN ACEITE - use sustitutos en polvo.   Si no mejora Prueba prilosec otc 20mg  Tome 30-60 minutos antes de la primera comida del da y Freight forwarder (famotidina) 20 mg una @  hora de acostarse hasta que la tos ha desaparecido por lo menos una semana sin necesidad de supresin de la tos    Por favor, programe una visita de oficina de seguimiento en 6 semanas, llame antes si es necesario con pfts

## 2015-09-10 NOTE — Progress Notes (Signed)
   Subjective:    Patient ID: Claire Hill, female    DOB: 02-09-1971,    MRN: [id number]  HPI  44 yo [place] female born prematurely in [place] with problems all her life with recurrent cough /sob but stretches where does fine in between with no symptoms or needs for meds referred sp ER Eval (not in Epic ? Different emr(   09/10/2015 1st [facility] office visit/ [place]   Chief Complaint  Patient presents with  . Pulmonary Consult    Self referral. Pt c/o cough and SOB for the past 2 wks. She states has hx of having bronchitis.  Her cough is prod with clear to yellow sputum.  She states that she feels SOB in the am when she wakes up.   pattern has gone on "all her life" but note she does have good stretches for months prior to present flare 2 weeks prior to OV  And better p rx with abx/ pred/ saba - typically triggers with "colds" esp in winter   No obvious   patterns in day to day or daytime variabilty or assoc chronic cough or cp or chest tightness, subjective wheeze overt sinus or hb symptoms. No unusual exp hx or h/o childhood pna/ asthma or knowledge of premature birth.  Sleeping ok without nocturnal  or early am exacerbation  of respiratory  c/o's or need for noct saba. Also denies any obvious fluctuation of symptoms with weather or environmental changes or other aggravating or alleviating factors except as outlined above   Current Medications, Allergies, Complete Past Medical History, Past Surgical History, Family History, and Social History were reviewed in [employer] record.             Review of Systems  Constitutional: Negative for fever, chills and unexpected weight change.  HENT: Positive for congestion and ear pain. Negative for dental problem, nosebleeds, postnasal drip, rhinorrhea, sinus pressure, sneezing, sore throat, trouble swallowing and voice change.   Eyes: Negative for visual disturbance.  Respiratory: Positive for cough and  shortness of breath. Negative for choking.   Cardiovascular: Negative for chest pain and leg swelling.  Gastrointestinal: Negative for vomiting, abdominal pain and diarrhea.  Genitourinary: Negative for difficulty urinating.  Musculoskeletal: Negative for arthralgias.  Skin: Negative for rash.  Neurological: Negative for tremors, syncope and headaches.  Hematological: Does not bruise/bleed easily.       Objective:   Physical Exam  amb hispanic pleasant wf nad  Wt Readings from Last 3 Encounters:  09/10/15 74.39 kg (164 lb)  11/07/14 82.555 kg (182 lb)  06/17/14 79.379 kg (175 lb)    Vital signs reviewed  HEENT: nl dentition, turbinates, and oropharynx. Nl external ear canals without cough reflex   NECK :  without JVD/Nodes/TM/ nl carotid upstrokes bilaterally   LUNGS: no acc muscle use, clear to A and P bilaterally without cough on insp or exp maneuvers   CV:  RRR  no s3 or murmur or increase in P2, no edema   ABD:  soft and nontender with nl excursion in the supine position. No bruits or organomegaly, bowel sounds nl  MS:  warm without deformities, calf tenderness, cyanosis or clubbing  SKIN: warm and dry without lesions    NEURO:  alert, approp, no deficits         Assessment & Plan:

## 2015-10-08 ENCOUNTER — Telehealth: Payer: Self-pay | Admitting: Behavioral Health

## 2015-10-08 NOTE — Telephone Encounter (Signed)
The following care gaps were assessed: A1C, Microalbumin, Mammogram, Pap & Flu Shot. Unable to reach patient at time of call. Left message for a call back to possibly schedule a physical with PCP.

## 2015-10-23 ENCOUNTER — Ambulatory Visit: Payer: Self-pay | Admitting: Internal Medicine

## 2015-11-09 ENCOUNTER — Ambulatory Visit (INDEPENDENT_AMBULATORY_CARE_PROVIDER_SITE_OTHER): Payer: Self-pay | Admitting: Internal Medicine

## 2015-11-09 ENCOUNTER — Encounter: Payer: Self-pay | Admitting: Internal Medicine

## 2015-11-09 VITALS — BP 130/80 | HR 88 | Ht 67.0 in | Wt 163.0 lb

## 2015-11-09 DIAGNOSIS — J45991 Cough variant asthma: Secondary | ICD-10-CM

## 2015-11-09 LAB — PULMONARY FUNCTION TEST
DL/VA % PRED: 94 %
DL/VA: 4.89 ml/min/mmHg/L
DLCO UNC: 24.42 ml/min/mmHg
DLCO unc % pred: 86 %
FEF 25-75 POST: 3.16 L/s
FEF 25-75 Pre: 2.53 L/sec
FEF2575-%Change-Post: 24 %
FEF2575-%Pred-Post: 100 %
FEF2575-%Pred-Pre: 80 %
FEV1-%Change-Post: 7 %
FEV1-%PRED-PRE: 84 %
FEV1-%Pred-Post: 90 %
FEV1-Post: 2.9 L
FEV1-Pre: 2.71 L
FEV1FVC-%Change-Post: 1 %
FEV1FVC-%PRED-PRE: 97 %
FEV6-%CHANGE-POST: 6 %
FEV6-%PRED-POST: 92 %
FEV6-%Pred-Pre: 87 %
FEV6-Post: 3.62 L
FEV6-Pre: 3.41 L
FEV6FVC-%CHANGE-POST: 0 %
FEV6FVC-%Pred-Post: 102 %
FEV6FVC-%Pred-Pre: 101 %
FVC-%Change-Post: 6 %
FVC-%Pred-Post: 90 %
FVC-%Pred-Pre: 85 %
FVC-Post: 3.62 L
FVC-Pre: 3.41 L
Post FEV1/FVC ratio: 80 %
Post FEV6/FVC ratio: 100 %
Pre FEV1/FVC ratio: 79 %
Pre FEV6/FVC Ratio: 100 %
RV % pred: 107 %
RV: 1.97 L
TLC % PRED: 97 %
TLC: 5.36 L

## 2015-11-09 NOTE — Progress Notes (Signed)
PFT done today. 

## 2015-11-09 NOTE — Progress Notes (Signed)
Subjective:    Patient ID: Claire Hill, female    DOB: 15-Aug-1971    MRN: [id number]    Brief patient profile:  45 yo [place] female born prematurely in [place] with problems all her life with recurrent cough /sob but stretches where does fine in between with no symptoms or needs for meds referred to pulmonary clinic 09/10/15  sp ER Eval (not in Epic ? Different emr?)    History of Present Illness  09/10/2015 1st [facility] office visit/ Claire Hill   Chief Complaint  Patient presents with  . Pulmonary Consult    Self referral. Pt c/o cough and SOB for the past 2 wks. She states has hx of having bronchitis.  Her cough is prod with clear to yellow sputum.  She states that she feels SOB in the am when she wakes up.   pattern has gone on "all her life" but note she does have good stretches for months prior to present flare 2 weeks prior to OV  And better p rx with abx/ pred/ saba - typically triggers with "colds" esp in winter  rec Plan A = automatic = symbicort 80 Take 2 puffs first thing in am and then another 2 puffs about 12 hours later.  Plan B = Backup - Only use your albuterol(yellow inhaler/ proventil)  as a rescue medication  Work on inhaler technique GERD diet  If not improving Try prilosec otc 20mg   Take 30-60 min before first meal of the day and Pepcid ac (famotidine) 20 mg one @  bedtime until cough is completely gone> did not start     11/09/2015  f/u ov/Robert Sperl re: ? Asthma on symbicort 80 2bid / did not start gerd rx  Chief Complaint  Patient presents with  . Follow-up    PFT done today  not using saba at all/ sleeping ok  Main issues are throat itching/ R ear pain / very vague as to onset/ pattern Not limited by breathing from desired activities    No obvious day to day or daytime variability or assoc chronic cough or cp or chest tightness, subjective wheeze or overt sinus or hb symptoms. No unusual exp hx or h/o childhood pna/ asthma or knowledge of  premature birth.  Sleeping ok without nocturnal  or early am exacerbation  of respiratory  c/o's or need for noct saba. Also denies any obvious fluctuation of symptoms with weather or environmental changes or other aggravating or alleviating factors except as outlined above   Current Medications, Allergies, Complete Past Medical History, Past Surgical History, Family History, and Social History were reviewed in [employer] record.  ROS  The following are not active complaints unless bolded sore throat, dysphagia, dental problems, itching, sneezing,  nasal congestion or excess/ purulent secretions, ear ache,   fever, chills, sweats, unintended wt loss, classically pleuritic or exertional cp, hemoptysis,  orthopnea pnd or leg swelling, presyncope, palpitations, abdominal pain, anorexia, nausea, vomiting, diarrhea  or change in bowel or bladder habits, change in stools or urine, dysuria,hematuria,  rash, arthralgias, visual complaints, headache, numbness, weakness or ataxia or problems with walking or coordination,  change in mood/affect or memory.                           Objective:   Physical Exam  amb hispanic pleasant wf nad  11/09/2015       163   09/10/15 74.39 kg (164 lb)  11/07/14  82.555 kg (182 lb)  06/17/14 79.379 kg (175 lb)    Vital signs reviewed  HEENT: nl dentition, turbinates, and oropharynx which is pristine. Nl external ear canals without cough reflex/ nl tm's   NECK :  without JVD/Nodes/TM/ nl carotid upstrokes bilaterally   LUNGS: no acc muscle use, clear to A and P bilaterally without cough on insp or exp maneuvers   CV:  RRR  no s3 or murmur or increase in P2, no edema   ABD:  soft and nontender with nl excursion in the supine position. No bruits or organomegaly, bowel sounds nl  MS:  warm without deformities, calf tenderness, cyanosis or clubbing  SKIN: warm and dry without lesions    NEURO:  alert, approp, no deficits           Assessment & Plan:

## 2015-11-09 NOTE — Assessment & Plan Note (Addendum)
09/10/2015  extensive coaching HFA effectiveness =    75% from a baseline of 25%  - PFT's  11/09/2015  FEV1 2.90 (90 % ) ratio 80  p 7 % improvement from saba with DLCO  86 % corrects to 94  % for alv volume  p am symbicort 80 x 2 pffs   All goals of chronic asthma control met including optimal function and elimination of symptoms with minimal need for rescue therapy.  Contingencies discussed in full including contacting this office immediately if not controlling the symptoms using the rule of two's.     I had an extended discussion with the patient and husband reviewing all relevant studies completed to date and  lasting 15 to 20 minutes of a 25 minute visit    Some of her upper airway symptoms may be related to gerd so asked her to go ahead and try gerd diet/ acid suppression as previously rec for at least a month to see what benefit if any she notes   Each maintenance medication was reviewed in detail including most importantly the difference between maintenance and prns and under what circumstances the prns are to be triggered using an action plan format that is not reflected in the computer generated alphabetically organized AVS.    Please see instructions for details which were reviewed in writing and the patient given a copy highlighting the part that I personally wrote and discussed at today's ov.

## 2015-11-09 NOTE — Patient Instructions (Addendum)
Try prilosec otc 20mg   Take 30-60 min before first meal of the day and Pepcid ac (famotidine) 20 mg one @  bedtime until  Seen by primary care   GERD (REFLUX)  is an extremely common cause of respiratory symptoms just like yours , many times with no obvious heartburn at all.    It can be treated with medication, but also with lifestyle changes including elevation of the head of your bed (ideally with 6 inch  bed blocks),  Smoking cessation, avoidance of late meals, excessive alcohol, and avoid fatty foods, chocolate, peppermint, colas, red wine, and acidic juices such as orange juice.  NO MINT OR MENTHOL PRODUCTS SO NO COUGH DROPS  USE SUGARLESS CANDY INSTEAD (Jolley ranchers or Stover's or Life Savers) or even ice chips will also do - the key is to swallow to prevent all throat clearing. NO OIL BASED VITAMINS - use powdered substitutes.  Continue symbicort 80 Take 2 puffs first thing in am and then another 2 puffs about 12 hours later.   Pulmonary follow up is as needed

## 2015-11-10 ENCOUNTER — Telehealth: Payer: Self-pay | Admitting: Family Medicine

## 2015-11-10 NOTE — Telephone Encounter (Signed)
At this time not able to take transfers from other offices due to 2 physicians leaving this practice. Try again in 6 months as we may be caught up by then. Recommend seeing her doctor in the meantime as not in to the office in almost 2 years.

## 2015-11-10 NOTE — Telephone Encounter (Signed)
Pt is wanting to transfer to [facility] office, from Dr. Etter Sjogren to Dr. Sharlet Salina Please advise

## 2015-11-11 ENCOUNTER — Other Ambulatory Visit: Payer: Self-pay | Admitting: Internal Medicine

## 2015-11-11 MED ORDER — BUDESONIDE-FORMOTEROL FUMARATE 80-4.5 MCG/ACT IN AERO: INHALATION_SPRAY | RESPIRATORY_TRACT | Status: DC

## 2015-11-12 ENCOUNTER — Telehealth: Payer: Self-pay | Admitting: Internal Medicine

## 2015-11-12 MED ORDER — AZITHROMYCIN 250 MG PO TABS: ORAL_TABLET | ORAL | Status: DC

## 2015-11-12 MED ORDER — PREDNISONE 10 MG PO TABS: ORAL_TABLET | ORAL | Status: DC

## 2015-11-12 NOTE — Telephone Encounter (Signed)
Spoke with pt's husband, pt c/o prod cough with yellow mucus since yesterday-also notes sinus congestion, PND.  Pt requesting cough syrup.   Denies chest pain, fever, chills.  Taking meds prescribed by MW at last ov.  Pt uses [facility] on [address].   MW please advise.  Thanks.    Patient Instructions        Try prilosec otc 20mg   Take 30-60 min before first meal of the day and Pepcid ac (famotidine) 20 mg one @  bedtime until  Seen by primary care   GERD (REFLUX)  is an extremely common cause of respiratory symptoms just like yours , many times with no obvious heartburn at all.    It can be treated with medication, but also with lifestyle changes including elevation of the head of your bed (ideally with 6 inch  bed blocks),  Smoking cessation, avoidance of late meals, excessive alcohol, and avoid fatty foods, chocolate, peppermint, colas, red wine, and acidic juices such as orange juice.   NO MINT OR MENTHOL PRODUCTS SO NO COUGH DROPS  USE SUGARLESS CANDY INSTEAD (Jolley ranchers or Stover's or Life Savers) or even ice chips will also do - the key is to swallow to prevent all throat clearing. NO OIL BASED VITAMINS - use powdered substitutes.  Continue symbicort 80 Take 2 puffs first thing in am and then another 2 puffs about 12 hours later.   Pulmonary follow up is as needed

## 2015-11-12 NOTE — Telephone Encounter (Signed)
Pt's husband informed.

## 2015-11-12 NOTE — Telephone Encounter (Signed)
Spoke with pt's husband, aware of recs.  rx sent to preferred pharmacy.  Nothing further needed.

## 2015-11-12 NOTE — Telephone Encounter (Signed)
Can't do more than delsym otc over the phone but to rx acutely rec Zpak/ Prednisone 10 mg take  4 each am x 2 days,   2 each am x 2 days,  1 each am x 2 days and stop ov if not 100% next week

## 2015-11-19 ENCOUNTER — Telehealth: Payer: Self-pay | Admitting: Internal Medicine

## 2015-11-19 ENCOUNTER — Encounter: Payer: Self-pay | Admitting: Family Medicine

## 2015-11-19 ENCOUNTER — Ambulatory Visit (INDEPENDENT_AMBULATORY_CARE_PROVIDER_SITE_OTHER): Payer: BLUE CROSS/BLUE SHIELD | Admitting: Family Medicine

## 2015-11-19 VITALS — BP 110/68 | HR 81 | Temp 98.5°F | Wt 158.8 lb

## 2015-11-19 DIAGNOSIS — Z91048 Other nonmedicinal substance allergy status: Secondary | ICD-10-CM | POA: Diagnosis not present

## 2015-11-19 DIAGNOSIS — Z9109 Other allergy status, other than to drugs and biological substances: Secondary | ICD-10-CM

## 2015-11-19 MED ORDER — TRIAMCINOLONE ACETONIDE 55 MCG/ACT NA AERO: 2.0000 | INHALATION_SPRAY | Freq: Every day | NASAL | Status: DC

## 2015-11-19 MED ORDER — LEVOCETIRIZINE DIHYDROCHLORIDE 5 MG PO TABS: 5.0000 mg | ORAL_TABLET | Freq: Every evening | ORAL | Status: DC

## 2015-11-19 NOTE — Patient Instructions (Signed)
Allergic Rhinitis Allergic rhinitis is when the mucous membranes in the nose respond to allergens. Allergens are particles in the air that cause your body to have an allergic reaction. This causes you to release allergic antibodies. Through a chain of events, these eventually cause you to release histamine into the blood stream. Although meant to protect the body, it is this release of histamine that causes your discomfort, such as frequent sneezing, congestion, and an itchy, runny nose.  CAUSES Seasonal allergic rhinitis (hay fever) is caused by pollen allergens that may come from grasses, trees, and weeds. Year-round allergic rhinitis (perennial allergic rhinitis) is caused by allergens such as house dust mites, pet dander, and mold spores. SYMPTOMS  Nasal stuffiness (congestion).  Itchy, runny nose with sneezing and tearing of the eyes. DIAGNOSIS Your health care provider can help you determine the allergen or allergens that trigger your symptoms. If you and your health care provider are unable to determine the allergen, skin or blood testing may be used. Your health care provider will diagnose your condition after taking your health history and performing a physical exam. Your health care provider may assess you for other related conditions, such as asthma, pink eye, or an ear infection. TREATMENT Allergic rhinitis does not have a cure, but it can be controlled by:  Medicines that block allergy symptoms. These may include allergy shots, nasal sprays, and oral antihistamines.  Avoiding the allergen. Hay fever may often be treated with antihistamines in pill or nasal spray forms. Antihistamines block the effects of histamine. There are over-the-counter medicines that may help with nasal congestion and swelling around the eyes. Check with your health care provider before taking or giving this medicine. If avoiding the allergen or the medicine prescribed do not work, there are many new medicines  your health care provider can prescribe. Stronger medicine may be used if initial measures are ineffective. Desensitizing injections can be used if medicine and avoidance does not work. Desensitization is when a patient is given ongoing shots until the body becomes less sensitive to the allergen. Make sure you follow up with your health care provider if problems continue. HOME CARE INSTRUCTIONS It is not possible to completely avoid allergens, but you can reduce your symptoms by taking steps to limit your exposure to them. It helps to know exactly what you are allergic to so that you can avoid your specific triggers. SEEK MEDICAL CARE IF:  You have a fever.  You develop a cough that does not stop easily (persistent).  You have shortness of breath.  You start wheezing.  Symptoms interfere with normal daily activities.   This information is not intended to replace advice given to you by your health care provider. Make sure you discuss any questions you have with your health care provider.   Document Released: 07/05/2001 Document Revised: 10/31/2014 Document Reviewed: 06/17/2013 Elsevier Interactive Patient Education 2016 Elsevier Inc.  

## 2015-11-19 NOTE — Progress Notes (Signed)
Pre visit review using our clinic review tool, if applicable. No additional management support is needed unless otherwise documented below in the visit note. 

## 2015-11-19 NOTE — Progress Notes (Signed)
Patient ID: Claire Hill, female    DOB: [birth year]  Age: 45 y.o. MRN: [id number]    Subjective:  Subjective HPI Claire Hill presents for f/u cough.  She just saw pulmonary and was diagnosed with cough variant asthma.    Review of Systems  Constitutional: Positive for chills. Negative for fever.  HENT: Positive for congestion, postnasal drip, rhinorrhea and sinus pressure.   Respiratory: Positive for cough. Negative for chest tightness, shortness of breath and wheezing.   Cardiovascular: Negative for chest pain, palpitations and leg swelling.  Allergic/Immunologic: Negative for environmental allergies.    History Past Medical History  Diagnosis Date  . Diabetes mellitus without complication ([place])   . Herpes     Right eye  . COPD (chronic obstructive pulmonary disease) ([place])   . Glaucoma   . Herpes simplex of eye   . OCD (obsessive compulsive disorder)   . Hyperlipidemia   . Anemia   . Diabetes mellitus   . Asthma     pt states no-no inhalers  . Allergy     She has past surgical history that includes No past surgeries and Mass excision (06/22/2012).   Her family history includes Diabetes in her father; Hypertension in her mother.She reports that she has never smoked. She has never used smokeless tobacco. She reports that she does not drink alcohol or use illicit drugs.  Current Outpatient Prescriptions on File Prior to Visit  Medication Sig Dispense Refill  . aspirin (ASPIRIN EC) 81 MG EC tablet Take 1 tablet (81 mg total) by mouth daily. Swallow whole. 30 tablet 12  . brimonidine-timolol (COMBIGAN) 0.2-0.5 % ophthalmic solution Place 1 drop into the right eye every 12 (twelve) hours.    . budesonide-formoterol (SYMBICORT) 80-4.5 MCG/ACT inhaler Take 2 puffs first thing in am and then another 2 puffs about 12 hours later. 1 Inhaler 11  . glucose blood (ONE TOUCH ULTRA TEST) test strip Test blood sugar twice daily 100 each 11  . metFORMIN (GLUCOPHAGE-XR) 500 MG 24  hr tablet Take 1 tablet (500 mg total) by mouth 4 (four) times daily. 120 tablet 11  . ONETOUCH DELICA LANCETS FINE MISC 1 each by Does not apply route 2 (two) times daily. Dx 249.50 100 each 2  . PROAIR HFA 108 (90 BASE) MCG/ACT inhaler Inhale 2 puffs into the lungs every 4 (four) hours as needed.     . valACYclovir (VALTREX) 1000 MG tablet Take 1,000 mg by mouth daily.    Marland Kitchen albuterol (PROVENTIL HFA;VENTOLIN HFA) 108 (90 BASE) MCG/ACT inhaler Inhale 1-2 puffs into the lungs every 6 (six) hours as needed for wheezing or shortness of breath.    Marland Kitchen atorvastatin (LIPITOR) 40 MG tablet 1 tab by mouth daily-- labs are due now (Patient not taking: Reported on 11/19/2015) 30 tablet 0  . chlorpheniramine-HYDROcodone (TUSSIONEX PENNKINETIC ER) 10-8 MG/5ML SUER Take 5 mLs by mouth at bedtime as needed for cough. 60 mL 0  . glimepiride (AMARYL) 1 MG tablet Take 1 tablet (1 mg total) by mouth daily with breakfast. (Patient not taking: Reported on 11/19/2015) 30 tablet 11  . guaiFENesin (MUCINEX) 600 MG 12 hr tablet Take 2 tablets (1,200 mg total) by mouth 2 (two) times daily as needed (for cold symptoms). 12 tablet 0  . ibuprofen (ADVIL,MOTRIN) 200 MG tablet Take 400-600 mg by mouth every 6 (six) hours as needed (for shortness of breath).    . metFORMIN (GLUCOPHAGE-XR) 500 MG 24 hr tablet Take 500 mg by mouth 4 (four) times  daily.    . predniSONE (DELTASONE) 10 MG tablet Take 6 on day 1 Take 5 on day 2 Take 4 on day 3 Take 3 on day 4 Take 2 on day 5 Take 1 on day 6 21 tablet 0  . valACYclovir (VALTREX) 1000 MG tablet Take 1,000 mg by mouth daily.     No current facility-administered medications on file prior to visit.     Objective:  Objective Physical Exam  Constitutional: She is oriented to person, place, and time. She appears well-developed and well-nourished.  HENT:  Head: Normocephalic and atraumatic.  Right Ear: External ear normal.  Left Ear: External ear normal.  Nose: Nose normal.    Mouth/Throat: Oropharynx is clear and moist. No oropharyngeal exudate.  Eyes: Conjunctivae and EOM are normal.  Neck: Normal range of motion. Neck supple. No JVD present. Carotid bruit is not present. No thyromegaly present.  Cardiovascular: Normal rate, regular rhythm and normal heart sounds.   No murmur heard. Pulmonary/Chest: Effort normal and breath sounds normal. No respiratory distress. She has no wheezes. She has no rales. She exhibits no tenderness.  Musculoskeletal: She exhibits no edema.  Neurological: She is alert and oriented to person, place, and time.  Psychiatric: She has a normal mood and affect.  Nursing note and vitals reviewed.  BP 110/68 mmHg  Pulse 81  Temp(Src) 98.5 F (36.9 C) (Oral)  Wt 158 lb 12.8 oz (72.031 kg)  SpO2 95% Wt Readings from Last 3 Encounters:  11/19/15 158 lb 12.8 oz (72.031 kg)  11/09/15 163 lb (73.936 kg)  09/10/15 164 lb (74.39 kg)     Lab Results  Component Value Date   WBC 3.4* 01/13/2012   HGB 11.3* 06/22/2012   HCT 38.4 01/13/2012   PLT 300.0 01/13/2012   GLUCOSE 181* 03/15/2013   CHOL 227* 03/15/2013   TRIG 100 03/15/2013   HDL 84 03/15/2013   LDLDIRECT 173.1 03/30/2012   LDLCALC 123* 03/15/2013   ALT 25 03/15/2013   AST 30 03/15/2013   NA 138 03/15/2013   K 4.0 03/15/2013   CL 103 03/15/2013   CREATININE 0.65 03/15/2013   BUN 12 03/15/2013   CO2 26 03/15/2013   TSH 1.12 01/13/2012   HGBA1C 7.0* 06/17/2014   MICROALBUR 0.7 06/17/2014    No results found.   Assessment & Plan:  Plan I have discontinued Ms. Sundstrom's azithromycin and predniSONE. I am also having her start on levocetirizine and triamcinolone. Additionally, I am having her maintain her aspirin, PROAIR HFA, ONETOUCH DELICA LANCETS FINE, brimonidine-timolol, atorvastatin, glucose blood, glimepiride, metFORMIN, valACYclovir, and budesonide-formoterol.  Meds ordered this encounter  Medications  . levocetirizine (XYZAL) 5 MG tablet    Sig: Take 1  tablet (5 mg total) by mouth every evening.    Dispense:  30 tablet    Refill:  5  . triamcinolone (NASACORT AQ) 55 MCG/ACT AERO nasal inhaler    Sig: Place 2 sprays into the nose daily.    Dispense:  1 Inhaler    Refill:  12    Problem List Items Addressed This Visit    None    Visit Diagnoses    Environmental allergies    -  Primary    Relevant Medications    levocetirizine (XYZAL) 5 MG tablet    triamcinolone (NASACORT AQ) 55 MCG/ACT AERO nasal inhaler    Other Relevant Orders    Ambulatory referral to Pulmonology       Follow-up: Return if symptoms worsen or fail to  improve.  Garnet Koyanagi, DO

## 2015-11-19 NOTE — Telephone Encounter (Signed)
Per Vallarie Mare- Referral note sent to Dr Etter Sjogren already letting them know appt with [facility] not available until May and asking about her ordering RAST instead

## 2015-11-20 ENCOUNTER — Encounter: Payer: Self-pay | Admitting: Family Medicine

## 2015-11-26 ENCOUNTER — Telehealth: Payer: Self-pay | Admitting: Family Medicine

## 2015-11-26 ENCOUNTER — Other Ambulatory Visit: Payer: Self-pay | Admitting: Family Medicine

## 2015-11-26 DIAGNOSIS — Z9109 Other allergy status, other than to drugs and biological substances: Secondary | ICD-10-CM

## 2015-11-26 NOTE — Telephone Encounter (Signed)
Please advise      KP 

## 2015-11-26 NOTE — Telephone Encounter (Signed)
She does not have to con't cough med if feeling better

## 2015-11-26 NOTE — Telephone Encounter (Signed)
Caller name: Verdene Lennert Agricultural engineer) Relation to pt: self Call back number:[phone] or [phone] Pharmacy:  Reason for call: Pt came in office and stated was seen by Dr. Etter Sjogren on 11-19-15 for coughing, pt was prescribed guaiFENesin (MUCINEX) 600 MG 12 hr tablet and pt stated it is working very well, pt wants to know if she still has to continue meds or not since she is feeling better. Please advise

## 2015-11-26 NOTE — Telephone Encounter (Signed)
Patient has been made aware and verbalized understanding, he will have her stop the med's and wants to have the labs done at [facility] in.

## 2015-11-27 ENCOUNTER — Other Ambulatory Visit: Payer: BLUE CROSS/BLUE SHIELD

## 2015-11-27 DIAGNOSIS — Z9109 Other allergy status, other than to drugs and biological substances: Secondary | ICD-10-CM

## 2015-11-28 ENCOUNTER — Other Ambulatory Visit: Payer: Self-pay | Admitting: Endocrinology

## 2015-11-30 ENCOUNTER — Other Ambulatory Visit: Payer: Self-pay

## 2015-11-30 LAB — ALLERGEN PROFILE, PERENNIAL ALLERGEN IGE
Aureobasidi Pullulans IgE: <0.1 kU/L
Candida Albicans IgE: <0.1 kU/L
Chicken Feathers IgE: <0.1 kU/L
Cow Dander IgE: <0.1 kU/L
D001-IGE D PTERONYSSINUS: 8.31 kU/L — AB
D002-IGE D FARINAE: 8.42 kU/L — AB
Duck Feathers IgE: <0.1 kU/L
Mouse Urine IgE: <0.1 kU/L
Penicillium Chrysogen IgE: <0.1 kU/L
Phoma Betae IgE: <0.1 kU/L
Setomelanomma Rostrat: <0.1 kU/L

## 2015-12-01 ENCOUNTER — Other Ambulatory Visit: Payer: Self-pay | Admitting: Endocrinology

## 2015-12-02 ENCOUNTER — Other Ambulatory Visit: Payer: Self-pay

## 2015-12-02 MED ORDER — METFORMIN HCL ER 500 MG PO TB24: 500.0000 mg | ORAL_TABLET | Freq: Four times a day (QID) | ORAL | Status: DC

## 2016-01-10 ENCOUNTER — Other Ambulatory Visit: Payer: Self-pay | Admitting: Endocrinology

## 2016-01-14 ENCOUNTER — Telehealth: Payer: Self-pay | Admitting: Endocrinology

## 2016-01-14 MED ORDER — METFORMIN HCL ER 500 MG PO TB24: 500.0000 mg | ORAL_TABLET | Freq: Four times a day (QID) | ORAL | Status: DC

## 2016-01-14 NOTE — Addendum Note (Signed)
Addended by: Ewing Schlein on: 01/14/2016 06:22 PM   Modules accepted: Orders

## 2016-01-14 NOTE — Telephone Encounter (Signed)
megan  Please refer this message to [facility].
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
## 2016-01-14 NOTE — Telephone Encounter (Signed)
See note below and please advise, Thanks! 
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
## 2016-01-15 MED ORDER — GLIMEPIRIDE 1 MG PO TABS: 1.0000 mg | ORAL_TABLET | Freq: Every day | ORAL | Status: DC

## 2016-01-15 NOTE — Telephone Encounter (Addendum)
I contacted the pt to verify if she needed any of her other DM meds refilled. Pt stated she would like her glimepiride refilled. Rx has been refilled. Pt stated she needed a refill on her eye drops I advised Dr. Loanne Drilling does not refill this rx for her and to contact her eye doctor or PCP to refill.

## 2016-01-15 NOTE — Addendum Note (Signed)
Addended by: Verlin Grills T on: 01/15/2016 08:21 AM   Modules accepted: Orders

## 2016-01-15 NOTE — Telephone Encounter (Deleted)
See below

## 2016-03-03 LAB — HM DIABETES EYE EXAM

## 2016-03-14 ENCOUNTER — Telehealth: Payer: Self-pay | Admitting: Family Medicine

## 2016-03-14 NOTE — Telephone Encounter (Signed)
Pt called in because she says that she need to request a new blood sugar machine    CB: [phone]    Pharmacy: [facility].

## 2016-03-16 ENCOUNTER — Ambulatory Visit (INDEPENDENT_AMBULATORY_CARE_PROVIDER_SITE_OTHER): Payer: BLUE CROSS/BLUE SHIELD | Admitting: Endocrinology

## 2016-03-16 ENCOUNTER — Encounter: Payer: Self-pay | Admitting: Endocrinology

## 2016-03-16 VITALS — BP 126/84 | HR 83 | Temp 98.1°F | Ht 67.0 in | Wt 159.0 lb

## 2016-03-16 DIAGNOSIS — E119 Type 2 diabetes mellitus without complications: Secondary | ICD-10-CM | POA: Diagnosis not present

## 2016-03-16 DIAGNOSIS — M25579 Pain in unspecified ankle and joints of unspecified foot: Secondary | ICD-10-CM | POA: Insufficient documentation

## 2016-03-16 DIAGNOSIS — M25571 Pain in right ankle and joints of right foot: Secondary | ICD-10-CM | POA: Diagnosis not present

## 2016-03-16 LAB — POCT GLYCOSYLATED HEMOGLOBIN (HGB A1C): Hemoglobin A1C: 6.3

## 2016-03-16 MED ORDER — GLIMEPIRIDE 1 MG PO TABS: 0.5000 mg | ORAL_TABLET | Freq: Every day | ORAL | Status: DC

## 2016-03-16 MED ORDER — METFORMIN HCL ER 500 MG PO TB24: 500.0000 mg | ORAL_TABLET | Freq: Four times a day (QID) | ORAL | Status: DC

## 2016-03-16 NOTE — Telephone Encounter (Signed)
Informed patient that she can pick up new glucometer at any time during normal office hours. Patient voiced that if she does not come today, then she will get glucometer at her office visit on next Friday, 03/25/16 at 2:15 PM.

## 2016-03-16 NOTE — Patient Instructions (Addendum)
Please reduce the glimepiride to 1/2 pill each morning Please see a specialist for the ankle pain.  you will receive a phone call, about a day and time for an appointment. check your blood sugar once a day.  vary the time of day when you check, between before the 3 meals, and at bedtime.  also check if you have symptoms of your blood sugar being too high or too low.  please keep a record of the readings and bring it to your next appointment here (or you can bring the meter itself).  You can write it on any piece of paper.  please call us sooner if your blood sugar goes below 70, or if you have a lot of readings over 200. Please come back for a follow-up appointment in 4 months.

## 2016-03-16 NOTE — Progress Notes (Signed)
Subjective:    Patient ID: Claire Hill, female    DOB: 1970/11/06, 45 y.o.   MRN: [id number]  HPI Pt returns for f/u of diabetes mellitus: DM type: 2 Dx'ed: 0000000 Complications: none Therapy: 2 oral meds GDM: never DKA: never Severe hypoglycemia: never Pancreatitis: never.   Other: she has never been on insulin.   Interval history: She has regained her health insurance.  pt states she feels well in general.  she brings a record of her cbg's which i have reviewed today.  It varies from 150-400, but most are in the 200's.  She has slight numbness of the feet, but no assoc pain.   Past Medical History  Diagnosis Date  . Diabetes mellitus without complication ([place])   . Herpes     Right eye  . COPD (chronic obstructive pulmonary disease) ([place])   . Glaucoma   . Herpes simplex of eye   . OCD (obsessive compulsive disorder)   . Hyperlipidemia   . Anemia   . Diabetes mellitus   . Asthma     pt states no-no inhalers  . Allergy     Past Surgical History  Procedure Laterality Date  . No past surgeries    . Mass excision  06/22/2012    Procedure: EXCISION MASS;  Surgeon: Harl Bowie, MD;  Location: [facility];  Service: General;  Laterality: Right;  Excision chronic Right axillary cyst    Social History   Social History  . Marital Status: Married    Spouse Name: N/A  . Number of Children: N/A  . Years of Education: N/A   Occupational History  . factory production    Social History Main Topics  . Smoking status: Never Smoker   . Smokeless tobacco: Never Used  . Alcohol Use: No  . Drug Use: No  . Sexual Activity:    Partners: Male   Other Topics Concern  . Not on file   Social History Narrative   ** Merged History Encounter **        Current Outpatient Prescriptions on File Prior to Visit  Medication Sig Dispense Refill  . aspirin (ASPIRIN EC) 81 MG EC tablet Take 1 tablet (81 mg total) by mouth daily. Swallow whole. 30 tablet 12    . budesonide-formoterol (SYMBICORT) 80-4.5 MCG/ACT inhaler Take 2 puffs first thing in am and then another 2 puffs about 12 hours later. 1 Inhaler 11  . valACYclovir (VALTREX) 1000 MG tablet Take 1,000 mg by mouth daily.    . valACYclovir (VALTREX) 1000 MG tablet Take 1,000 mg by mouth daily.    Marland Kitchen albuterol (PROVENTIL HFA;VENTOLIN HFA) 108 (90 BASE) MCG/ACT inhaler Inhale 1-2 puffs into the lungs every 6 (six) hours as needed for wheezing or shortness of breath. Reported on 03/16/2016    . atorvastatin (LIPITOR) 40 MG tablet 1 tab by mouth daily-- labs are due now (Patient not taking: Reported on 11/19/2015) 30 tablet 0  . brimonidine-timolol (COMBIGAN) 0.2-0.5 % ophthalmic solution Place 1 drop into the right eye every 12 (twelve) hours. Reported on 03/16/2016    . glucose blood (ONE TOUCH ULTRA TEST) test strip Test blood sugar twice daily (Patient not taking: Reported on 03/16/2016) 100 each 11  . ibuprofen (ADVIL,MOTRIN) 200 MG tablet Take 400-600 mg by mouth every 6 (six) hours as needed (for shortness of breath). Reported on 03/16/2016    . ONETOUCH DELICA LANCETS FINE MISC 1 each by Does not apply route 2 (two) times  daily. Dx 249.50 (Patient not taking: Reported on 03/16/2016) 100 each 2  . PROAIR HFA 108 (90 BASE) MCG/ACT inhaler Inhale 2 puffs into the lungs every 4 (four) hours as needed. Reported on 03/16/2016    . triamcinolone (NASACORT AQ) 55 MCG/ACT AERO nasal inhaler Place 2 sprays into the nose daily. (Patient not taking: Reported on 03/16/2016) 1 Inhaler 12   No current facility-administered medications on file prior to visit.    No Known Allergies  Family History  Problem Relation Age of Onset  . Diabetes Father   . Hypertension Mother     BP 126/84 mmHg  Pulse 83  Temp(Src) 98.1 F (36.7 C) (Oral)  Ht 5\' 7"  (1.702 m)  Wt 159 lb (72.122 kg)  BMI 24.90 kg/m2  SpO2 99%    Review of Systems She denies hypoglycemia.  She has 1 week of right ankle pain--no injury     Objective:   Physical Exam VITAL SIGNS:  See vs page GENERAL: no distress Pulses: dorsalis pedis intact bilat.   MSK: no deformity of the feet CV: no leg edema Skin:  no ulcer on the feet.  normal color and temp on the feet. Neuro: sensation is intact to touch on the feet Ext: right ankle is nontender    A1c=6.3%    Assessment & Plan:  Overcontrolled, this is the best control this pt should aim for, given this SU-containing regimen Ankle pain, new, uncertain etiology.   Patient is advised the following: Patient Instructions  Please reduce the glimepiride to 1/2 pill each morning Please see a specialist for the ankle pain.  you will receive a phone call, about a day and time for an appointment. check your blood sugar once a day.  vary the time of day when you check, between before the 3 meals, and at bedtime.  also check if you have symptoms of your blood sugar being too high or too low.  please keep a record of the readings and bring it to your next appointment here (or you can bring the meter itself).  You can write it on any piece of paper.  please call us sooner if your blood sugar goes below 70, or if you have a lot of readings over 200. Please come back for a follow-up appointment in 4 months.

## 2016-03-17 MED ORDER — ONETOUCH DELICA LANCETS FINE MISC: Status: DC

## 2016-03-17 MED ORDER — GLUCOSE BLOOD VI STRP: ORAL_STRIP | Status: DC

## 2016-03-17 NOTE — Addendum Note (Signed)
Addended by: Kathlen Brunswick on: 03/17/2016 04:25 PM   Modules accepted: Orders

## 2016-03-17 NOTE — Telephone Encounter (Addendum)
Patient came in clinic on 03/16/16 to get new glucometer. She did not have any questions or concerns prior to leaving the office. Rx's for additional lancets & glucose strips sent to the patient's preferred pharmacy.

## 2016-03-22 ENCOUNTER — Encounter: Payer: Self-pay | Admitting: Family Medicine

## 2016-03-25 ENCOUNTER — Encounter: Payer: Self-pay | Admitting: Family Medicine

## 2016-03-25 ENCOUNTER — Ambulatory Visit (INDEPENDENT_AMBULATORY_CARE_PROVIDER_SITE_OTHER): Payer: BLUE CROSS/BLUE SHIELD | Admitting: Family Medicine

## 2016-03-25 VITALS — BP 112/70 | HR 92 | Temp 98.0°F | Ht 67.0 in | Wt 161.2 lb

## 2016-03-25 DIAGNOSIS — J302 Other seasonal allergic rhinitis: Secondary | ICD-10-CM | POA: Diagnosis not present

## 2016-03-25 DIAGNOSIS — IMO0002 Reserved for concepts with insufficient information to code with codable children: Secondary | ICD-10-CM

## 2016-03-25 DIAGNOSIS — R519 Headache, unspecified: Secondary | ICD-10-CM

## 2016-03-25 DIAGNOSIS — E1139 Type 2 diabetes mellitus with other diabetic ophthalmic complication: Secondary | ICD-10-CM | POA: Diagnosis not present

## 2016-03-25 DIAGNOSIS — R51 Headache: Secondary | ICD-10-CM | POA: Diagnosis not present

## 2016-03-25 DIAGNOSIS — E785 Hyperlipidemia, unspecified: Secondary | ICD-10-CM | POA: Diagnosis not present

## 2016-03-25 DIAGNOSIS — E1165 Type 2 diabetes mellitus with hyperglycemia: Secondary | ICD-10-CM

## 2016-03-25 LAB — SEDIMENTATION RATE: SED RATE: 11 mm/h (ref 0–20)

## 2016-03-25 LAB — COMPREHENSIVE METABOLIC PANEL
ALBUMIN: 4.5 g/dL (ref 3.6–5.1)
ALK PHOS: 85 U/L (ref 33–115)
ALT: 30 U/L — AB (ref 6–29)
AST: 25 U/L (ref 10–30)
BILIRUBIN TOTAL: 0.3 mg/dL (ref 0.2–1.2)
BUN: 15 mg/dL (ref 7–25)
CO2: 30 mmol/L (ref 20–31)
CREATININE: 0.73 mg/dL (ref 0.50–1.10)
Calcium: 10 mg/dL (ref 8.6–10.2)
Chloride: 100 mmol/L (ref 98–110)
Glucose, Bld: 130 mg/dL — ABNORMAL HIGH (ref 65–99)
Potassium: 4.7 mmol/L (ref 3.5–5.3)
SODIUM: 142 mmol/L (ref 135–146)
TOTAL PROTEIN: 6.8 g/dL (ref 6.1–8.1)

## 2016-03-25 LAB — LIPID PANEL
CHOLESTEROL: 219 mg/dL — AB (ref 125–200)
HDL: 91 mg/dL (ref >=46)
LDL CALC: 98 mg/dL (ref <130)
TRIGLYCERIDES: 152 mg/dL — AB (ref <150)
Total CHOL/HDL Ratio: 2.4 Ratio (ref <=5.0)
VLDL: 30 mg/dL (ref <30)

## 2016-03-25 MED ORDER — LEVOCETIRIZINE DIHYDROCHLORIDE 5 MG PO TABS: 5.0000 mg | ORAL_TABLET | Freq: Every evening | ORAL | Status: DC

## 2016-03-25 MED ORDER — GLIMEPIRIDE 1 MG PO TABS: 0.5000 mg | ORAL_TABLET | Freq: Every day | ORAL | Status: DC

## 2016-03-25 MED ORDER — METFORMIN HCL ER 500 MG PO TB24
500.0000 mg | ORAL_TABLET | Freq: Four times a day (QID) | ORAL | Status: DC
Start: 2016-03-25 — End: 2016-11-14

## 2016-03-25 MED ORDER — FLUTICASONE PROPIONATE 50 MCG/ACT NA SUSP: 2.0000 | Freq: Every day | NASAL | Status: DC

## 2016-03-25 NOTE — Progress Notes (Signed)
Pre visit review using our clinic review tool, if applicable. No additional management support is needed unless otherwise documented below in the visit note. 

## 2016-03-25 NOTE — Patient Instructions (Signed)

## 2016-03-25 NOTE — Progress Notes (Signed)
Patient ID: Claire Hill, female    DOB: [birth year]  Age: 45 y.o. MRN: [id number]    Subjective:  Subjective HPI Claire Hill presents for c/o headaches that are becoming more frequent. She had seen the opththamologist and is being sent to another "specialist" because something abnormal was seen but she does not know what.    HPI HYPERTENSION  Blood pressure range-not checking   Chest pain- no      Dyspnea- no Lightheadedness- no   Edema- no Other side effects - no   Medication compliance: good Low salt diet- yes  DIABETES  Blood Sugar ranges-not checking   Polyuria- no New Visual problems- no Hypoglycemic symptoms- no Other side effects-no Medication compliance - good Last eye exam- done   HYPERLIPIDEMIA  Medication compliance- good RUQ pain- no  Muscle aches- no Other side effects-no   Review of Systems  Constitutional: Negative for diaphoresis, appetite change, fatigue and unexpected weight change.  Eyes: Negative for pain, redness and visual disturbance.  Respiratory: Negative for cough, chest tightness, shortness of breath and wheezing.   Cardiovascular: Negative for chest pain, palpitations and leg swelling.  Endocrine: Negative for cold intolerance, heat intolerance, polydipsia, polyphagia and polyuria.  Genitourinary: Negative for dysuria, frequency and difficulty urinating.  Neurological: Positive for headaches. Negative for dizziness, light-headedness and numbness.    History Past Medical History  Diagnosis Date  . Diabetes mellitus without complication ([facility])   . Herpes     Right eye  . COPD (chronic obstructive pulmonary disease) ([facility])   . Glaucoma   . Herpes simplex of eye   . OCD (obsessive compulsive disorder)   . Hyperlipidemia   . Anemia   . Diabetes mellitus   . Asthma     pt states no-no inhalers  . Allergy     She has past surgical history that includes No past surgeries and Mass excision (06/22/2012).   Her family  history includes Diabetes in her father; Hypertension in her mother.She reports that she has never smoked. She has never used smokeless tobacco. She reports that she does not drink alcohol or use illicit drugs.  Current Outpatient Prescriptions on File Prior to Visit  Medication Sig Dispense Refill  . albuterol (PROVENTIL HFA;VENTOLIN HFA) 108 (90 BASE) MCG/ACT inhaler Inhale 1-2 puffs into the lungs every 6 (six) hours as needed for wheezing or shortness of breath. Reported on 03/16/2016    . aspirin (ASPIRIN EC) 81 MG EC tablet Take 1 tablet (81 mg total) by mouth daily. Swallow whole. 30 tablet 12  . brimonidine-timolol (COMBIGAN) 0.2-0.5 % ophthalmic solution Place 1 drop into the right eye every 12 (twelve) hours. Reported on 03/16/2016    . budesonide-formoterol (SYMBICORT) 80-4.5 MCG/ACT inhaler Take 2 puffs first thing in am and then another 2 puffs about 12 hours later. 1 Inhaler 11  . glucose blood (ONE TOUCH ULTRA TEST) test strip Test blood sugar twice daily 100 each 11  . glucose blood test strip Check blood sugar twice daily. 100 each 12  . ibuprofen (ADVIL,MOTRIN) 200 MG tablet Take 400-600 mg by mouth every 6 (six) hours as needed (for shortness of breath). Reported on 03/16/2016    . ONETOUCH DELICA LANCETS FINE MISC 1 each by Does not apply route 2 (two) times daily. Dx 249.50 100 each 2  . ONETOUCH DELICA LANCETS FINE MISC Check blood sugar twice daily. 100 each 12  . PROAIR HFA 108 (90 BASE) MCG/ACT inhaler Inhale 2 puffs into the lungs every  4 (four) hours as needed. Reported on 03/16/2016    . valACYclovir (VALTREX) 1000 MG tablet Take 1,000 mg by mouth daily.     No current facility-administered medications on file prior to visit.     Objective:  Objective Physical Exam  Constitutional: She is oriented to person, place, and time. She appears well-developed and well-nourished.  HENT:  Head: Normocephalic and atraumatic.  Eyes: Conjunctivae and EOM are normal.  Neck: Normal  range of motion. Neck supple. No JVD present. Carotid bruit is not present. No thyromegaly present.  Cardiovascular: Normal rate, regular rhythm and normal heart sounds.   No murmur heard. Pulmonary/Chest: Effort normal and breath sounds normal. No respiratory distress. She has no wheezes. She has no rales. She exhibits no tenderness.  Musculoskeletal: She exhibits no edema.  Neurological: She is alert and oriented to person, place, and time.  Psychiatric: She has a normal mood and affect.  Nursing note and vitals reviewed.  BP 112/70 mmHg  Pulse 92  Temp(Src) 98 F (36.7 C) (Oral)  Ht _0  (1.702 m)  Wt 161 lb 3.2 oz (73.12 kg)  BMI 25.24 kg/m2  SpO2 98% Wt Readings from Last 3 Encounters:  03/25/16 161 lb 3.2 oz (73.12 kg)  03/16/16 159 lb (72.122 kg)  11/19/15 158 lb 12.8 oz (72.031 kg)     Lab Results  Component Value Date   WBC 3.4* 01/13/2012   HGB 11.3* 06/22/2012   HCT 38.4 01/13/2012   PLT 300.0 01/13/2012   GLUCOSE 130* 03/25/2016   CHOL 219* 03/25/2016   TRIG 152* 03/25/2016   HDL 91 03/25/2016   LDLDIRECT 173.1 03/30/2012   LDLCALC 98 03/25/2016   ALT 30* 03/25/2016   AST 25 03/25/2016   NA 142 03/25/2016   K 4.7 03/25/2016   CL 100 03/25/2016   CREATININE 0.73 03/25/2016   BUN 15 03/25/2016   CO2 30 03/25/2016   TSH 1.12 01/13/2012   HGBA1C 6.3 03/16/2016   MICROALBUR 0.7 06/17/2014    Dg Chest 2 View  08/30/2015  CLINICAL DATA:  Cough, scratchy throat, chest pain for 4 days, shortness of breath for 1 week. History of COPD, diabetes. EXAM: CHEST  2 VIEW COMPARISON:  Chest radiograph September 02, 2004 FINDINGS: Cardiomediastinal silhouette is normal. Mild bronchitic changes. The lungs are clear without pleural effusions or focal consolidations. Trachea projects midline and there is no pneumothorax. Soft tissue planes and included osseous structures are non-suspicious. IMPRESSION: Mild bronchitic changes without focal consolidation. Electronically Signed    By: Elon Alas M.D.   On: 08/30/2015 03:49     Assessment & Plan:  Plan I have discontinued Ms. Mitrano's atorvastatin and triamcinolone. I am also having her start on levocetirizine and fluticasone. Additionally, I am having her maintain her valACYclovir, albuterol, ibuprofen, aspirin, PROAIR HFA, ONETOUCH DELICA LANCETS FINE, brimonidine-timolol, glucose blood, budesonide-formoterol, glucose blood, ONETOUCH DELICA LANCETS FINE, metFORMIN, and glimepiride.  Meds ordered this encounter  Medications  . levocetirizine (XYZAL) 5 MG tablet    Sig: Take 1 tablet (5 mg total) by mouth every evening.    Dispense:  30 tablet    Refill:  5  . fluticasone (FLONASE) 50 MCG/ACT nasal spray    Sig: Place 2 sprays into both nostrils daily.    Dispense:  16 g    Refill:  6  . metFORMIN (GLUCOPHAGE-XR) 500 MG 24 hr tablet    Sig: Take 1 tablet (500 mg total) by mouth 4 (four) times daily.  Dispense:  120 tablet    Refill:  11  . glimepiride (AMARYL) 1 MG tablet    Sig: Take 0.5 tablets (0.5 mg total) by mouth daily with breakfast.    Dispense:  30 tablet    Refill:  4    Problem List Items Addressed This Visit    Hyperlipidemia - Primary   Relevant Orders   Comp Met (CMET) (Completed)   Lipid panel (Completed)   Sed Rate (ESR) (Completed)    Other Visit Diagnoses    Headache disorder        Relevant Orders    Comp Met (CMET) (Completed)    Lipid panel (Completed)    Sed Rate (ESR) (Completed)    Seasonal allergic rhinitis        Relevant Medications    levocetirizine (XYZAL) 5 MG tablet    fluticasone (FLONASE) 50 MCG/ACT nasal spray    Other Relevant Orders    Comp Met (CMET) (Completed)    Lipid panel (Completed)    Sed Rate (ESR) (Completed)    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  Ann Held, DO

## 2016-04-04 ENCOUNTER — Other Ambulatory Visit: Payer: Self-pay

## 2016-04-04 ENCOUNTER — Ambulatory Visit (INDEPENDENT_AMBULATORY_CARE_PROVIDER_SITE_OTHER): Payer: BLUE CROSS/BLUE SHIELD | Admitting: Family Medicine

## 2016-04-04 ENCOUNTER — Encounter: Payer: Self-pay | Admitting: Family Medicine

## 2016-04-04 VITALS — BP 112/80 | HR 86 | Wt 160.0 lb

## 2016-04-04 DIAGNOSIS — M25572 Pain in left ankle and joints of left foot: Secondary | ICD-10-CM

## 2016-04-04 DIAGNOSIS — M216X2 Other acquired deformities of left foot: Secondary | ICD-10-CM | POA: Diagnosis not present

## 2016-04-04 DIAGNOSIS — M25571 Pain in right ankle and joints of right foot: Secondary | ICD-10-CM | POA: Diagnosis not present

## 2016-04-04 DIAGNOSIS — M79672 Pain in left foot: Secondary | ICD-10-CM | POA: Diagnosis not present

## 2016-04-04 MED ORDER — DICLOFENAC SODIUM 2 % TD SOLN: 2.0000 "application " | Freq: Two times a day (BID) | TRANSDERMAL | Status: DC

## 2016-04-04 NOTE — Assessment & Plan Note (Signed)
She also had loss of the transverse arch. We discussed the importance of metatarsal pad. We discussed proper shoes and over-the-counter orthotics. I do think that this will make patient have improvement.

## 2016-04-04 NOTE — Assessment & Plan Note (Signed)
Patient does have breakdown of the longitudinal and transverse arch of the foot. I do think that this is contributing to the pain. We discussed icing regimen and home exercises. We discussed which activities doing which ones to avoid. Patient will come back and see me again in 4 weeks. Patient was given exercises with athletic trainer. Discussed proper shoes and over-the-counter orthotics. Given perception for topical anti-inflammatories help pain.

## 2016-04-04 NOTE — Patient Instructions (Signed)
Good to see you  Ice 20 minutes 2 times daily. Usually after activity and before bed. pennsaid pinkie amount topically 2 times daily as needed.  Exercises 3 times a week.  Iron 65mg  daily with 500mg  of vitamin C should help with some of the cramping Avoid being barefoot even in the house.  Good shoes with rigid bottom.  Claire Hill, Merrell or New balance greater then 700 Spenco orthotics "total support" online would be great  See me again in 4 weeks and we will make sure you are all better.

## 2016-04-04 NOTE — Progress Notes (Signed)
[facility] [address], [place] [address] Phone: [phone] Subjective:    I'm seeing this patient by the request  of:    CC: Left ankle pain  [id number] Claire Hill is a 45 y.o. female coming in with complaint of left ankle and foot pain. Patient describes it as a dull throbbing aching sensation that seems to get worse with increasing activity. Patient does not remember any true injury. More of an insidious onset. States this seems to be worse with increasing activity. Denies any numbness with it but does have muscle cramping. Cramping seems to be worse at night. Patient states it's usually on the anterior aspect of the foot. No swelling associated with it. Rates the severity of pain a 6 out of 10. Seems to be starting to get very similar presentation on the contralateral side she states. Patient was to be active and wants to continue to do so. Has not tried any home modalities at this time.     Past Medical History  Diagnosis Date  . Diabetes mellitus without complication ([facility])   . Herpes     Right eye  . COPD (chronic obstructive pulmonary disease) ([facility])   . Glaucoma   . Herpes simplex of eye   . OCD (obsessive compulsive disorder)   . Hyperlipidemia   . Anemia   . Diabetes mellitus   . Asthma     pt states no-no inhalers  . Allergy    Past Surgical History  Procedure Laterality Date  . No past surgeries    . Mass excision  06/22/2012    Procedure: EXCISION MASS;  Surgeon: Harl Bowie, MD;  Location: [facility];  Service: General;  Laterality: Right;  Excision chronic Right axillary cyst   Social History   Social History  . Marital Status: Married    Spouse Name: N/A  . Number of Children: N/A  . Years of Education: N/A   Occupational History  . factory production    Social History Main Topics  . Smoking status: Never Smoker   . Smokeless tobacco: Never Used  . Alcohol Use: No  . Drug  Use: No  . Sexual Activity:    Partners: Male   Other Topics Concern  . None   Social History Narrative   ** Merged History Encounter **       No Known Allergies Family History  Problem Relation Age of Onset  . Diabetes Father   . Hypertension Mother     Past medical history, social, surgical and family history all reviewed in electronic medical record.  No pertanent information unless stated regarding to the chief complaint.   Review of Systems: No headache, visual changes, nausea, vomiting, diarrhea, constipation, dizziness, abdominal pain, skin rash, fevers, chills, night sweats, weight loss, swollen lymph nodes, body aches, joint swelling, muscle aches, chest pain, shortness of breath, mood changes.   Objective Blood pressure 112/80, pulse 86, weight 160 lb (72.576 kg), SpO2 98 %.  General: No apparent distress alert and oriented x3 mood and affect normal, dressed appropriately.  HEENT: Pupils equal, extraocular movements intact  Respiratory: Patient's speak in full sentences and does not appear short of breath  Cardiovascular: No lower extremity edema, non tender, no erythema  Skin: Warm dry intact with no signs of infection or rash on extremities or on axial skeleton.  Abdomen: Soft nontender  Neuro: Cranial nerves II through XII are intact, neurovascularly intact in all extremities with  2+ DTRs and 2+ pulses.  Lymph: No lymphadenopathy of posterior or anterior cervical chain or axillae bilaterally.  Gait normal with good balance and coordination.  MSK:  Non tender with full range of motion and good stability and symmetric strength and tone of shoulders, elbows, wrist, hip, knee and bilaterally.  Ankle: left  No visible erythema or swelling. Range of motion is full in all directions. Strength is 5/5 in all directions. Stable lateral and medial ligaments; squeeze test and kleiger test unremarkable; Talar dome nontender; No pain at base of 5th MT; No tenderness over  cuboid; No tenderness over N spot or navicular prominence No tenderness on posterior aspects of lateral and medial malleolus No sign of peroneal tendon subluxations or tenderness to palpation Negative tarsal tunnel tinel's Able to walk 4 steps. Patient though does have significant breakdown of the transverse arch bilaterally left greater than right. Does have splaying between the first and second toes on the left side. Patient does have breakdown of the transverse arch is well. Mild short midfoot.  Procedure note [id number]; 15 minutes spent for Therapeutic exercises as stated in above notes.  This included exercises focusing on stretching, strengthening, with significant focus on eccentric aspects. Exercises for the foot include:  Stretches to help lengthen the lower leg and plantar fascia areas Theraband exercises for the lower leg and ankle to help strengthen the surrounding area- dorsiflexion, plantarflexion, inversion, eversion Massage rolling on the plantar surface of the foot with a frozen bottle, tennis ball or golf ball Towel or marble pick-ups to strengthen the plantar surface of the foot Weight bearing exercises to increase balance and overall stability   Proper technique shown and discussed handout in great detail with ATC.  All questions were discussed and answered.     Impression and Recommendations:     This case required medical decision making of moderate complexity.      Note: This dictation was prepared with Dragon dictation along with smaller phrase technology. Any transcriptional errors that result from this process are unintentional.

## 2016-04-04 NOTE — Assessment & Plan Note (Signed)
Patient is also been describing more muscle cramps. Looking at patient's leg she is likely iron deficient. We discussed iron supplementation. Patient will monitor this as well. Follow-up again in 4 weeks.

## 2016-04-04 NOTE — Progress Notes (Signed)
Pre visit review using our clinic review tool, if applicable. No additional management support is needed unless otherwise documented below in the visit note. 

## 2016-04-18 ENCOUNTER — Ambulatory Visit (INDEPENDENT_AMBULATORY_CARE_PROVIDER_SITE_OTHER): Payer: BLUE CROSS/BLUE SHIELD | Admitting: Family Medicine

## 2016-04-18 ENCOUNTER — Encounter: Payer: Self-pay | Admitting: Family Medicine

## 2016-04-18 ENCOUNTER — Other Ambulatory Visit: Payer: Self-pay

## 2016-04-18 VITALS — BP 116/76 | HR 97 | Ht 67.0 in | Wt 160.0 lb

## 2016-04-18 DIAGNOSIS — M216X2 Other acquired deformities of left foot: Secondary | ICD-10-CM

## 2016-04-18 DIAGNOSIS — R200 Anesthesia of skin: Secondary | ICD-10-CM | POA: Diagnosis not present

## 2016-04-18 DIAGNOSIS — D3613 Benign neoplasm of peripheral nerves and autonomic nervous system of lower limb, including hip: Secondary | ICD-10-CM | POA: Diagnosis not present

## 2016-04-18 NOTE — Progress Notes (Signed)
Pre visit review using our clinic review tool, if applicable. No additional management support is needed unless otherwise documented below in the visit note. 

## 2016-04-18 NOTE — Patient Instructions (Signed)
Good to see you  I tried an injection today to calm the foot down.  Ice is your friend Ice bath 10 minutes at night can help [employer] orthotics "total support" online would be great  Good shoes with rigid bottom.  Jalene Mullet, Merrell or New balance greater then 700 Physical therapy will be calling you  COntinue the pennsaid if it is helping See me again in 6 weeks to make sure you are doing well.

## 2016-04-18 NOTE — Assessment & Plan Note (Signed)
Patient given injection today and tolerated the procedure well. We discussed icing regimen, home exercises, which activities doing which ones to avoid. We discussed with patient about formal physical therapy and was referred today. Discussed with patient again proper shoes which she did not do previously. Patient has not been doing the exercises. Encourage her to monitor her blood sugars a little closer over the course the next 3 days. Patient will come back and see me again in 6 weeks for further evaluation and treatment.

## 2016-04-18 NOTE — Progress Notes (Signed)
[facility] [address], [place] [address] Phone: [phone] Subjective:    I'm seeing this patient by the request  of:    CC: Left ankle pain f/u  [id number] Claire Hill is a 45 y.o. female coming in with complaint of left ankle and foot pain. Patient was found to have breakdown of the transverse as well as some longitudinal arch breakdown of the left foot. Patient was recommended to try over-the-counter orthotics, home exercises, given topical anti-inflammatories, patient was to do some home exercises for strengthening instability of the ankle. Patient states The ankle feels somewhat better. Continues to have pain in the foot. No associated with some numbness. Seems to be between the second and third toes being the worse area. Sometimes can radiate to the first toe. Patient denies any significant swelling at the time. Patient did not get any of the over-the-counter orthotics or new shoes. Did not do the exercises. Was fairly noncompliant.     Past Medical History  Diagnosis Date  . Diabetes mellitus without complication ([facility])   . Herpes     Right eye  . COPD (chronic obstructive pulmonary disease) ([facility])   . Glaucoma   . Herpes simplex of eye   . OCD (obsessive compulsive disorder)   . Hyperlipidemia   . Anemia   . Diabetes mellitus   . Asthma     pt states no-no inhalers  . Allergy    Past Surgical History  Procedure Laterality Date  . No past surgeries    . Mass excision  06/22/2012    Procedure: EXCISION MASS;  Surgeon: Harl Bowie, MD;  Location: [facility];  Service: General;  Laterality: Right;  Excision chronic Right axillary cyst   Social History   Social History  . Marital Status: Married    Spouse Name: N/A  . Number of Children: N/A  . Years of Education: N/A   Occupational History  . factory production    Social History Main Topics  . Smoking status: Never Smoker   . Smokeless  tobacco: Never Used  . Alcohol Use: No  . Drug Use: No  . Sexual Activity:    Partners: Male   Other Topics Concern  . Not on file   Social History Narrative   ** Merged History Encounter **       No Known Allergies Family History  Problem Relation Age of Onset  . Diabetes Father   . Hypertension Mother     Past medical history, social, surgical and family history all reviewed in electronic medical record.  No pertanent information unless stated regarding to the chief complaint.   Review of Systems: No headache, visual changes, nausea, vomiting, diarrhea, constipation, dizziness, abdominal pain, skin rash, fevers, chills, night sweats, weight loss, swollen lymph nodes, body aches, joint swelling, muscle aches, chest pain, shortness of breath, mood changes.   Objective There were no vitals taken for this visit.  General: No apparent distress alert and oriented x3 mood and affect normal, dressed appropriately.  HEENT: Pupils equal, extraocular movements intact  Respiratory: Patient's speak in full sentences and does not appear short of breath  Cardiovascular: No lower extremity edema, non tender, no erythema  Skin: Warm dry intact with no signs of infection or rash on extremities or on axial skeleton.  Abdomen: Soft nontender  Neuro: Cranial nerves II through XII are intact, neurovascularly intact in all extremities with 2+ DTRs and 2+ pulses.  Lymph:  No lymphadenopathy of posterior or anterior cervical chain or axillae bilaterally.  Gait normal with good balance and coordination.  MSK:  Non tender with full range of motion and good stability and symmetric strength and tone of shoulders, elbows, wrist, hip, knee and bilaterally.  Ankle: left  No visible erythema or swelling. Range of motion is full in all directions. Strength is 5/5 in all directions. Stable lateral and medial ligaments; squeeze test and kleiger test unremarkable; Talar dome nontender; No pain at base of 5th  MT; No tenderness over cuboid; No tenderness over N spot or navicular prominence No tenderness on posterior aspects of lateral and medial malleolus No sign of peroneal tendon subluxations or tenderness to palpation Negative tarsal tunnel tinel's Able to walk 4 steps. Patient though does have significant breakdown of the transverse arch bilaterally left greater than right. Does have splaying between the first and second toes on the left side. Patient does have breakdown of the transverse arch is well. Mild short midfoot.Positive squeeze test  Procedure: Real-time Ultrasound Guided Injection of second intermetatarsal space on the left foot Device: GE Logiq E  Ultrasound guided injection is preferred based studies that show increased duration, increased effect, greater accuracy, decreased procedural pain, increased response rate, and decreased cost with ultrasound guided versus blind injection.  Verbal informed consent obtained.  Time-out conducted.  Noted no overlying erythema, induration, or other signs of local infection.  Skin prepped in a sterile fashion.  Local anesthesia: Topical Ethyl chloride.  With sterile technique and under real time ultrasound guidance:  With a 25-gauge half-inch needle patient was injected with a total of 0.5 mL of 0.5% Marcaine and 0.5 mL of Kenalog 40 mg/dL. Completed without difficulty  Pain immediately resolved suggesting accurate placement of the medication.  Advised to call if fevers/chills, erythema, induration, drainage, or persistent bleeding.  Images permanently stored and available for review in the ultrasound unit.  Impression: Technically successful ultrasound guided injection.     Impression and Recommendations:     This case required medical decision making of moderate complexity.      Note: This dictation was prepared with Dragon dictation along with smaller phrase technology. Any transcriptional errors that result from this process are  unintentional.

## 2016-05-04 ENCOUNTER — Encounter: Payer: Self-pay | Admitting: Family Medicine

## 2016-05-28 NOTE — Progress Notes (Deleted)
[facility] [address] [place], [place] [address] Phone: [phone] Subjective:    I'm seeing this patient by the request  of:    CC: Left ankle pain f/u  [id number]  Claire [facility] is a 45 y.o. female coming in with complaint of left ankle and foot pain. Patient was found to have breakdown of the transverse as well as some longitudinal arch breakdown of the left foot. Patient was recommended to try over-the-counter orthotics, home exercises, given topical anti-inflammatories, patient was to do some home exercises for strengthening instability of the ankle.  Patient was seen previously for weeks ago and was doing better with the ankle but continued to have foot pain. Found to have a neuroma. Given an injection. Patient was to start more conservative therapy and continue with the over-the-counter orthotics. Patient states    Past Medical History:  Diagnosis Date  . Allergy   . Anemia   . Asthma    pt states no-no inhalers  . COPD (chronic obstructive pulmonary disease) ([place])   . Diabetes mellitus   . Diabetes mellitus without complication ([place])   . Glaucoma   . Herpes    Right eye  . Herpes simplex of eye   . Hyperlipidemia   . OCD (obsessive compulsive disorder)    Past Surgical History:  Procedure Laterality Date  . MASS EXCISION  06/22/2012   Procedure: EXCISION MASS;  Surgeon: Harl Bowie, MD;  Location: [facility];  Service: General;  Laterality: Right;  Excision chronic Right axillary cyst  . NO PAST SURGERIES     Social History   Social History  . Marital status: Married    Spouse name: N/A  . Number of children: N/A  . Years of education: N/A   Occupational History  . factory production    Social History Main Topics  . Smoking status: Never Smoker  . Smokeless tobacco: Never Used  . Alcohol use No  . Drug use: No  . Sexual activity: Yes    Partners: Male   Other Topics Concern  . Not on  file   Social History Narrative   ** Merged History Encounter **       No Known Allergies Family History  Problem Relation Age of Onset  . Diabetes Father   . Hypertension Mother     Past medical history, social, surgical and family history all reviewed in electronic medical record.  No pertanent information unless stated regarding to the chief complaint.   Review of Systems: No headache, visual changes, nausea, vomiting, diarrhea, constipation, dizziness, abdominal pain, skin rash, fevers, chills, night sweats, weight loss, swollen lymph nodes, body aches, joint swelling, muscle aches, chest pain, shortness of breath, mood changes.   Objective  There were no vitals taken for this visit.  General: No apparent distress alert and oriented x3 mood and affect normal, dressed appropriately.  HEENT: Pupils equal, extraocular movements intact  Respiratory: Patient's speak in full sentences and does not appear short of breath  Cardiovascular: No lower extremity edema, non tender, no erythema  Skin: Warm dry intact with no signs of infection or rash on extremities or on axial skeleton.  Abdomen: Soft nontender  Neuro: Cranial nerves II through XII are intact, neurovascularly intact in all extremities with 2+ DTRs and 2+ pulses.  Lymph: No lymphadenopathy of posterior or anterior cervical chain or axillae bilaterally.  Gait normal with good balance and coordination.  MSK:  Non tender with  full range of motion and good stability and symmetric strength and tone of shoulders, elbows, wrist, hip, knee and bilaterally.  Ankle: left  No visible erythema or swelling. Range of motion is full in all directions. Strength is 5/5 in all directions. Stable lateral and medial ligaments; squeeze test and kleiger test unremarkable; Talar dome nontender; No pain at base of 5th MT; No tenderness over cuboid; No tenderness over N spot or navicular prominence No tenderness on posterior aspects of lateral and  medial malleolus No sign of peroneal tendon subluxations or tenderness to palpation Negative tarsal tunnel tinel's Able to walk 4 steps. Patient though does have significant breakdown of the transverse arch bilaterally left greater than right. Does have splaying between the first and second toes on the left side. Patient does have breakdown of the transverse arch is well. Mild short midfoot.Positive squeeze test      Impression and Recommendations:     This case required medical decision making of moderate complexity.      Note: This dictation was prepared with Dragon dictation along with smaller phrase technology. Any transcriptional errors that result from this process are unintentional.

## 2016-05-30 ENCOUNTER — Ambulatory Visit: Payer: BLUE CROSS/BLUE SHIELD | Admitting: Family Medicine

## 2016-07-12 LAB — HM DIABETES EYE EXAM

## 2016-09-30 ENCOUNTER — Ambulatory Visit: Payer: BLUE CROSS/BLUE SHIELD | Admitting: Family Medicine

## 2016-10-06 ENCOUNTER — Telehealth: Payer: Self-pay | Admitting: Endocrinology

## 2016-10-06 NOTE — Telephone Encounter (Signed)
See message and please clarify if these directions are correct.

## 2016-10-06 NOTE — Telephone Encounter (Signed)
Mali from [facility] need clarification on medication metFORMIN (GLUCOPHAGE-XR) 500 MG 24 hr tablet  Do patient need to take 1 tablet 4 x a day??  [phone]

## 2016-10-06 NOTE — Telephone Encounter (Signed)
Pharamcy advised of of MD's response. Appointment letter mailed to the pateint.

## 2016-10-06 NOTE — Telephone Encounter (Signed)
Yes. Please refill x 3 mos Ov is due

## 2016-11-14 ENCOUNTER — Telehealth: Payer: Self-pay | Admitting: *Deleted

## 2016-11-14 ENCOUNTER — Encounter: Payer: Self-pay | Admitting: Family Medicine

## 2016-11-14 ENCOUNTER — Ambulatory Visit (INDEPENDENT_AMBULATORY_CARE_PROVIDER_SITE_OTHER): Payer: BLUE CROSS/BLUE SHIELD | Admitting: Family Medicine

## 2016-11-14 VITALS — BP 110/70 | HR 77 | Temp 98.2°F | Resp 16 | Ht 67.0 in | Wt 145.4 lb

## 2016-11-14 DIAGNOSIS — IMO0002 Reserved for concepts with insufficient information to code with codable children: Secondary | ICD-10-CM

## 2016-11-14 DIAGNOSIS — E785 Hyperlipidemia, unspecified: Secondary | ICD-10-CM

## 2016-11-14 DIAGNOSIS — E1139 Type 2 diabetes mellitus with other diabetic ophthalmic complication: Secondary | ICD-10-CM

## 2016-11-14 DIAGNOSIS — E1165 Type 2 diabetes mellitus with hyperglycemia: Principal | ICD-10-CM

## 2016-11-14 DIAGNOSIS — E118 Type 2 diabetes mellitus with unspecified complications: Secondary | ICD-10-CM

## 2016-11-14 LAB — COMPREHENSIVE METABOLIC PANEL
ALBUMIN: 4.3 g/dL (ref 3.5–5.2)
ALK PHOS: 83 U/L (ref 39–117)
ALT: 23 U/L (ref 0–35)
AST: 25 U/L (ref 0–37)
BUN: 13 mg/dL (ref 6–23)
CHLORIDE: 102 meq/L (ref 96–112)
CO2: 31 mEq/L (ref 19–32)
CREATININE: 0.73 mg/dL (ref 0.40–1.20)
Calcium: 9.9 mg/dL (ref 8.4–10.5)
GFR: 91.43 mL/min (ref >60.00)
Glucose, Bld: 96 mg/dL (ref 70–99)
Potassium: 4.2 mEq/L (ref 3.5–5.1)
Sodium: 140 mEq/L (ref 135–145)
TOTAL PROTEIN: 7.1 g/dL (ref 6.0–8.3)
Total Bilirubin: 0.4 mg/dL (ref 0.2–1.2)

## 2016-11-14 LAB — POC URINALSYSI DIPSTICK (AUTOMATED)
BILIRUBIN UA: NEGATIVE
Glucose, UA: NEGATIVE
Ketones, UA: NEGATIVE
LEUKOCYTES UA: NEGATIVE
NITRITE UA: NEGATIVE
Protein, UA: NEGATIVE
RBC UA: NEGATIVE
Spec Grav, UA: 1.02
UROBILINOGEN UA: NEGATIVE
pH, UA: 6

## 2016-11-14 LAB — MICROALBUMIN, URINE: MICROALB UR: 0.3 mg/dL

## 2016-11-14 LAB — LIPID PANEL
CHOLESTEROL: 217 mg/dL — AB (ref 0–200)
HDL: 80.1 mg/dL (ref >39.00)
LDL Cholesterol: 104 mg/dL — ABNORMAL HIGH (ref 0–99)
NonHDL: 136.52
Total CHOL/HDL Ratio: 3
Triglycerides: 164 mg/dL — ABNORMAL HIGH (ref 0.0–149.0)
VLDL: 32.8 mg/dL (ref 0.0–40.0)

## 2016-11-14 LAB — HEMOGLOBIN A1C: HEMOGLOBIN A1C: 5.7 % (ref 4.6–6.5)

## 2016-11-14 MED ORDER — GLIMEPIRIDE 1 MG PO TABS: 0.5000 mg | ORAL_TABLET | Freq: Every day | ORAL | 4 refills | Status: DC

## 2016-11-14 MED ORDER — METFORMIN HCL ER 500 MG PO TB24: 1000.0000 mg | ORAL_TABLET | Freq: Every day | ORAL | 5 refills | Status: DC

## 2016-11-14 MED ORDER — BLOOD GLUCOSE MONITOR KIT: PACK | 0 refills | Status: AC

## 2016-11-14 MED ORDER — RELION LANCETS THIN 26G MISC: 5 refills | Status: DC

## 2016-11-14 MED ORDER — GLUCOSE BLOOD VI STRP: ORAL_STRIP | 5 refills | Status: DC

## 2016-11-14 NOTE — Telephone Encounter (Signed)
Should have been 2 a day not 4 of the xr ------ please change

## 2016-11-14 NOTE — Patient Instructions (Signed)
Carbohydrate Counting for Diabetes Mellitus, Adult Carbohydrate counting is a method for keeping track of how many carbohydrates you eat. Eating carbohydrates naturally increases the amount of sugar (glucose) in the blood. Counting how many carbohydrates you eat helps keep your blood glucose within normal limits, which helps you manage your diabetes (diabetes mellitus). It is important to know how many carbohydrates you can safely have in each meal. This is different for every person. A diet and nutrition specialist (registered dietitian) can help you make a meal plan and calculate how many carbohydrates you should have at each meal and snack. Carbohydrates are found in the following foods:  Grains, such as breads and cereals.  Dried beans and soy products.  Starchy vegetables, such as potatoes, peas, and corn.  Fruit and fruit juices.  Milk and yogurt.  Sweets and snack foods, such as cake, cookies, candy, chips, and soft drinks. How do I count carbohydrates? There are two ways to count carbohydrates in food. You can use either of the methods or a combination of both. Reading "Nutrition Facts" on packaged food  The "Nutrition Facts" list is included on the labels of almost all packaged foods and beverages in the [place] It includes:  The serving size.  Information about nutrients in each serving, including the grams (g) of carbohydrate per serving. To use the "Nutrition Facts":  Decide how many servings you will have.  Multiply the number of servings by the number of carbohydrates per serving.  The resulting number is the total amount of carbohydrates that you will be having. Learning standard serving sizes of other foods  When you eat foods containing carbohydrates that are not packaged or do not include "Nutrition Facts" on the label, you need to measure the servings in order to count the amount of carbohydrates:  Measure the foods that you will eat with a food scale or measuring  cup, if needed.  Decide how many standard-size servings you will eat.  Multiply the number of servings by 15. Most carbohydrate-rich foods have about 15 g of carbohydrates per serving.  For example, if you eat 8 oz (170 g) of strawberries, you will have eaten 2 servings and 30 g of carbohydrates (2 servings x 15 g = 30 g).  For foods that have more than one food mixed, such as soups and casseroles, you must count the carbohydrates in each food that is included. The following list contains standard serving sizes of common carbohydrate-rich foods. Each of these servings has about 15 g of carbohydrates:   hamburger bun or  English muffin.   oz (15 mL) syrup.   oz (14 g) jelly.  1 slice of bread.  1 six-inch tortilla.  3 oz (85 g) cooked rice or pasta.  4 oz (113 g) cooked dried beans.  4 oz (113 g) starchy vegetable, such as peas, corn, or potatoes.  4 oz (113 g) hot cereal.  4 oz (113 g) mashed potatoes or  of a large baked potato.  4 oz (113 g) canned or frozen fruit.  4 oz (120 mL) fruit juice.  4-6 crackers.  6 chicken nuggets.  6 oz (170 g) unsweetened dry cereal.  6 oz (170 g) plain fat-free yogurt or yogurt sweetened with artificial sweeteners.  8 oz (240 mL) milk.  8 oz (170 g) fresh fruit or one small piece of fruit.  24 oz (680 g) popped popcorn. Example of carbohydrate counting Sample meal  3 oz (85 g) chicken breast.  6 oz (  170 g) brown rice.  4 oz (113 g) corn.  8 oz (240 mL) milk.  8 oz (170 g) strawberries with sugar-free whipped topping. Carbohydrate calculation 1. Identify the foods that contain carbohydrates:  Rice.  Corn.  Milk.  Strawberries. 2. Calculate how many servings you have of each food:  2 servings rice.  1 serving corn.  1 serving milk.  1 serving strawberries. 3. Multiply each number of servings by 15 g:  2 servings rice x 15 g = 30 g.  1 serving corn x 15 g = 15 g.  1 serving milk x 15 g = 15  g.  1 serving strawberries x 15 g = 15 g. 4. Add together all of the amounts to find the total grams of carbohydrates eaten:  30 g + 15 g + 15 g + 15 g = 75 g of carbohydrates total. This information is not intended to replace advice given to you by your health care provider. Make sure you discuss any questions you have with your health care provider. Document Released: 10/10/2005 Document Revised: 04/29/2016 Document Reviewed: 03/23/2016 Elsevier Interactive Patient Education  2017 Elsevier Inc.  

## 2016-11-14 NOTE — Telephone Encounter (Signed)
Called [facility] and they stated that they have been dispensing the metformin xr 500mg   4 times a day.

## 2016-11-14 NOTE — Telephone Encounter (Signed)
Patient notified and rx sent in 

## 2016-11-14 NOTE — Progress Notes (Signed)
Pre visit review using our clinic review tool, if applicable. No additional management support is needed unless otherwise documented below in the visit note. 

## 2016-11-14 NOTE — Progress Notes (Signed)
Subjective:    Patient ID: [facility], female    DOB: [birth year], 46 y.o.   MRN: [id number]  Chief Complaint  Patient presents with  . Follow-up    blood sugar and cholesterol    HPI Patient is in today for f/u cholesterol and diabetes.  No complaints.  Pt states her BS have been in the 200 s.    Past Medical History:  Diagnosis Date  . Allergy   . Anemia   . Asthma    pt states no-no inhalers  . COPD (chronic obstructive pulmonary disease) ([facility])   . Diabetes mellitus   . Diabetes mellitus without complication ([place])   . Glaucoma   . Herpes    Right eye  . Herpes simplex of eye   . Hyperlipidemia   . OCD (obsessive compulsive disorder)     Past Surgical History:  Procedure Laterality Date  . MASS EXCISION  06/22/2012   Procedure: EXCISION MASS;  Surgeon: Harl Bowie, MD;  Location: [facility];  Service: General;  Laterality: Right;  Excision chronic Right axillary cyst  . NO PAST SURGERIES      Family History  Problem Relation Age of Onset  . Diabetes Father   . Hypertension Mother     Social History   Social History  . Marital status: Married    Spouse name: N/A  . Number of children: N/A  . Years of education: N/A   Occupational History  . factory production    Social History Main Topics  . Smoking status: Never Smoker  . Smokeless tobacco: Never Used  . Alcohol use No  . Drug use: No  . Sexual activity: Yes    Partners: Male   Other Topics Concern  . Not on file   Social History Narrative   ** Merged History Encounter **        Outpatient Medications Prior to Visit  Medication Sig Dispense Refill  . aspirin (ASPIRIN EC) 81 MG EC tablet Take 1 tablet (81 mg total) by mouth daily. Swallow whole. 30 tablet 12  . brimonidine-timolol (COMBIGAN) 0.2-0.5 % ophthalmic solution Place 1 drop into the right eye every 12 (twelve) hours. Reported on 03/16/2016    . budesonide-formoterol (SYMBICORT) 80-4.5 MCG/ACT inhaler  Take 2 puffs first thing in am and then another 2 puffs about 12 hours later. 1 Inhaler 11  . fluticasone (FLONASE) 50 MCG/ACT nasal spray Place 2 sprays into both nostrils daily. 16 g 6  . ibuprofen (ADVIL,MOTRIN) 200 MG tablet Take 400-600 mg by mouth every 6 (six) hours as needed (for shortness of breath). Reported on 03/16/2016    . valACYclovir (VALTREX) 1000 MG tablet Take 1,000 mg by mouth daily.    Marland Kitchen glimepiride (AMARYL) 1 MG tablet Take 0.5 tablets (0.5 mg total) by mouth daily with breakfast. 30 tablet 4  . glucose blood (ONE TOUCH ULTRA TEST) test strip Test blood sugar twice daily 100 each 11  . metFORMIN (GLUCOPHAGE-XR) 500 MG 24 hr tablet Take 1 tablet (500 mg total) by mouth 4 (four) times daily. 120 tablet 11  . ONETOUCH DELICA LANCETS FINE MISC 1 each by Does not apply route 2 (two) times daily. Dx 249.50 100 each 2  . albuterol (PROVENTIL HFA;VENTOLIN HFA) 108 (90 BASE) MCG/ACT inhaler Inhale 1-2 puffs into the lungs every 6 (six) hours as needed for wheezing or shortness of breath. Reported on 03/16/2016    . Diclofenac Sodium (PENNSAID) 2 % SOLN Place 2  application onto the skin 2 (two) times daily. 112 g 3  . glucose blood test strip Check blood sugar twice daily. 100 each 12  . levocetirizine (XYZAL) 5 MG tablet Take 1 tablet (5 mg total) by mouth every evening. 30 tablet 5  . ONETOUCH DELICA LANCETS FINE MISC Check blood sugar twice daily. 100 each 12  . PROAIR HFA 108 (90 BASE) MCG/ACT inhaler Inhale 2 puffs into the lungs every 4 (four) hours as needed. Reported on 03/16/2016     No facility-administered medications prior to visit.     No Known Allergies  Review of Systems  Constitutional: Negative for fever and malaise/fatigue.  HENT: Negative for congestion.   Eyes: Negative for blurred vision.  Respiratory: Negative for cough and shortness of breath.   Cardiovascular: Negative for chest pain, palpitations and leg swelling.  Gastrointestinal: Negative for vomiting.    Musculoskeletal: Negative for back pain.  Skin: Negative for rash.  Neurological: Negative for loss of consciousness and headaches.       Objective:    Physical Exam  Constitutional: She is oriented to person, place, and time. She appears well-developed and well-nourished. No distress.  HENT:  Head: Normocephalic and atraumatic.  Eyes: Conjunctivae are normal.  Neck: Normal range of motion. No thyromegaly present.  Cardiovascular: Normal rate and regular rhythm.   Pulmonary/Chest: Effort normal and breath sounds normal. She has no wheezes.  Abdominal: Soft. Bowel sounds are normal. There is no tenderness.  Musculoskeletal: Normal range of motion. She exhibits no edema or deformity.  Neurological: She is alert and oriented to person, place, and time.  Skin: Skin is warm and dry. She is not diaphoretic.  Psychiatric: She has a normal mood and affect.    BP 110/70 (BP Location: Left Arm, Cuff Size: Normal)   Pulse 77   Temp 98.2 F (36.8 C) (Oral)   Resp 16   Ht '5\' 7"'$  (1.702 m)   Wt 145 lb 6.4 oz (66 kg)   LMP 08/15/2016   SpO2 96%   BMI 22.77 kg/m  Wt Readings from Last 3 Encounters:  11/14/16 145 lb 6.4 oz (66 kg)  04/18/16 160 lb (72.6 kg)  04/04/16 160 lb (72.6 kg)     Lab Results  Component Value Date   WBC 3.4 (L) 01/13/2012   HGB 11.3 (L) 06/22/2012   HCT 38.4 01/13/2012   PLT 300.0 01/13/2012   GLUCOSE 96 11/14/2016   CHOL 217 (H) 11/14/2016   TRIG 164.0 (H) 11/14/2016   HDL 80.10 11/14/2016   LDLDIRECT 173.1 03/30/2012   LDLCALC 104 (H) 11/14/2016   ALT 23 11/14/2016   AST 25 11/14/2016   NA 140 11/14/2016   K 4.2 11/14/2016   CL 102 11/14/2016   CREATININE 0.73 11/14/2016   BUN 13 11/14/2016   CO2 31 11/14/2016   TSH 1.12 01/13/2012   HGBA1C 5.7 11/14/2016   MICROALBUR 0.3 11/14/2016    Lab Results  Component Value Date   TSH 1.12 01/13/2012   Lab Results  Component Value Date   WBC 3.4 (L) 01/13/2012   HGB 11.3 (L) 06/22/2012   HCT  38.4 01/13/2012   MCV 81.0 01/13/2012   PLT 300.0 01/13/2012   Lab Results  Component Value Date   NA 140 11/14/2016   K 4.2 11/14/2016   CO2 31 11/14/2016   GLUCOSE 96 11/14/2016   BUN 13 11/14/2016   CREATININE 0.73 11/14/2016   BILITOT 0.4 11/14/2016   ALKPHOS 83 11/14/2016  AST 25 11/14/2016   ALT 23 11/14/2016   PROT 7.1 11/14/2016   ALBUMIN 4.3 11/14/2016   CALCIUM 9.9 11/14/2016   GFR 91.43 11/14/2016   Lab Results  Component Value Date   CHOL 217 (H) 11/14/2016   Lab Results  Component Value Date   HDL 80.10 11/14/2016   Lab Results  Component Value Date   LDLCALC 104 (H) 11/14/2016   Lab Results  Component Value Date   TRIG 164.0 (H) 11/14/2016   Lab Results  Component Value Date   CHOLHDL 3 11/14/2016   Lab Results  Component Value Date   HGBA1C 5.7 11/14/2016      I acted as a Education administrator for Dr. Carollee Herter.  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 Allocca's albuterol, PROAIR HFA, ONETOUCH DELICA LANCETS FINE, ONETOUCH DELICA LANCETS FINE, levocetirizine, and Diclofenac Sodium. I am also having her start on blood glucose meter kit and supplies. Additionally, I am having her maintain her valACYclovir, ibuprofen,  aspirin, brimonidine-timolol, budesonide-formoterol, fluticasone, brimonidine, RELION LANCETS THIN 26G, glucose blood, glimepiride, and metFORMIN.  Meds ordered this encounter  Medications  . brimonidine (ALPHAGAN) 0.2 % ophthalmic solution    Refill:  98  . DISCONTD: glucose blood (RELION GLUCOSE TEST STRIPS) test strip    Sig: Use as instructed    Dispense:  100 each    Refill:  5  . DISCONTD: RELION LANCETS THIN 26G MISC    Sig: Use as directed twice a day. E11.8    Dispense:  100 each    Refill:  5  . blood glucose meter kit and supplies KIT    Sig: Use a directed twice a day.  E11.8.  Use Relion Meter    Dispense:  1 each    Refill:  0    Order Specific Question:   Number of strips    Answer:   100    Order Specific Question:   Number of lancets    Answer:   100  . RELION LANCETS THIN 26G MISC    Sig: Use as directed twice a day. E11.8    Dispense:  100 each    Refill:  5  . glucose blood (RELION GLUCOSE TEST STRIPS) test strip    Sig: Use as instructed    Dispense:  100 each    Refill:  5  . glimepiride (AMARYL) 1 MG tablet    Sig: Take 0.5 tablets (0.5 mg total) by mouth daily with breakfast.    Dispense:  30 tablet    Refill:  4  . metFORMIN (GLUCOPHAGE-XR) 500 MG 24 hr tablet    Sig: Take 2 tablets (1,000 mg total)  by mouth daily.    Dispense:  60 tablet    Refill:  5    PLEASE INACTIVATE RX FOR 4 TIMES A DAY    CMA served as scribe during this visit. History, Physical and Plan performed by medical provider. Documentation and orders reviewed and attested to.   Ann Held, DO

## 2016-11-15 ENCOUNTER — Other Ambulatory Visit: Payer: Self-pay | Admitting: Family Medicine

## 2016-11-15 DIAGNOSIS — E785 Hyperlipidemia, unspecified: Secondary | ICD-10-CM

## 2016-11-15 DIAGNOSIS — E119 Type 2 diabetes mellitus without complications: Secondary | ICD-10-CM

## 2016-11-15 MED ORDER — ATORVASTATIN CALCIUM 20 MG PO TABS: 20.0000 mg | ORAL_TABLET | Freq: Every day | ORAL | 2 refills | Status: DC

## 2017-01-12 ENCOUNTER — Ambulatory Visit (INDEPENDENT_AMBULATORY_CARE_PROVIDER_SITE_OTHER): Payer: BLUE CROSS/BLUE SHIELD | Admitting: Family Medicine

## 2017-01-12 ENCOUNTER — Encounter: Payer: Self-pay | Admitting: Family Medicine

## 2017-01-12 ENCOUNTER — Other Ambulatory Visit: Payer: Self-pay | Admitting: Family Medicine

## 2017-01-12 ENCOUNTER — Telehealth: Payer: Self-pay | Admitting: Family Medicine

## 2017-01-12 ENCOUNTER — Ambulatory Visit (HOSPITAL_BASED_OUTPATIENT_CLINIC_OR_DEPARTMENT_OTHER)
Admission: RE | Admit: 2017-01-12 | Discharge: 2017-01-12 | Disposition: A | Discharge status: Home / Self Care | Payer: BLUE CROSS/BLUE SHIELD | Source: Ambulatory Visit | Attending: Family Medicine | Admitting: Family Medicine

## 2017-01-12 VITALS — BP 135/86 | HR 81 | Temp 98.1°F | Resp 16 | Ht 67.0 in | Wt 143.4 lb

## 2017-01-12 DIAGNOSIS — E785 Hyperlipidemia, unspecified: Secondary | ICD-10-CM

## 2017-01-12 DIAGNOSIS — R935 Abnormal findings on diagnostic imaging of other abdominal regions, including retroperitoneum: Secondary | ICD-10-CM | POA: Diagnosis not present

## 2017-01-12 DIAGNOSIS — R1011 Right upper quadrant pain: Secondary | ICD-10-CM | POA: Diagnosis not present

## 2017-01-12 DIAGNOSIS — K819 Cholecystitis, unspecified: Secondary | ICD-10-CM

## 2017-01-12 LAB — COMPREHENSIVE METABOLIC PANEL
ALBUMIN: 4.2 g/dL (ref 3.5–5.2)
ALT: 75 U/L — AB (ref 0–35)
AST: 33 U/L (ref 0–37)
Alkaline Phosphatase: 94 U/L (ref 39–117)
BILIRUBIN TOTAL: 0.6 mg/dL (ref 0.2–1.2)
BUN: 16 mg/dL (ref 6–23)
CALCIUM: 10 mg/dL (ref 8.4–10.5)
CO2: 28 meq/L (ref 19–32)
CREATININE: 0.67 mg/dL (ref 0.40–1.20)
Chloride: 104 mEq/L (ref 96–112)
GFR: 100.87 mL/min (ref >60.00)
Glucose, Bld: 87 mg/dL (ref 70–99)
Potassium: 3.7 mEq/L (ref 3.5–5.1)
Sodium: 141 mEq/L (ref 135–145)
Total Protein: 6.7 g/dL (ref 6.0–8.3)

## 2017-01-12 LAB — AMYLASE: Amylase: 101 U/L (ref 27–131)

## 2017-01-12 LAB — LIPASE: Lipase: 31 U/L (ref 11.0–59.0)

## 2017-01-12 LAB — CBC WITH DIFFERENTIAL/PLATELET
Basophils Absolute: 0 10*3/uL (ref 0.0–0.1)
Basophils Relative: 0.8 % (ref 0.0–3.0)
EOS ABS: 0.1 10*3/uL (ref 0.0–0.7)
Eosinophils Relative: 1.7 % (ref 0.0–5.0)
HEMATOCRIT: 39.7 % (ref 36.0–46.0)
Hemoglobin: 13 g/dL (ref 12.0–15.0)
LYMPHS PCT: 43.4 % (ref 12.0–46.0)
Lymphs Abs: 1.7 10*3/uL (ref 0.7–4.0)
MCHC: 32.8 g/dL (ref 30.0–36.0)
MCV: 91.5 fl (ref 78.0–100.0)
Monocytes Absolute: 0.2 10*3/uL (ref 0.1–1.0)
Monocytes Relative: 3.9 % (ref 3.0–12.0)
Neutro Abs: 2 10*3/uL (ref 1.4–7.7)
Neutrophils Relative %: 50.2 % (ref 43.0–77.0)
Platelets: 254 10*3/uL (ref 150.0–400.0)
RBC: 4.34 Mil/uL (ref 3.87–5.11)
RDW: 13 % (ref 11.5–15.5)
WBC: 4 10*3/uL (ref 4.0–10.5)

## 2017-01-12 MED ORDER — ATORVASTATIN CALCIUM 20 MG PO TABS: 20.0000 mg | ORAL_TABLET | Freq: Every day | ORAL | 2 refills | Status: DC

## 2017-01-12 MED ORDER — OMEPRAZOLE 40 MG PO CPDR: 40.0000 mg | DELAYED_RELEASE_CAPSULE | Freq: Every day | ORAL | 5 refills | Status: DC

## 2017-01-12 NOTE — Patient Instructions (Signed)
Gastroesophageal Reflux Disease, Adult Normally, food travels down the esophagus and stays in the stomach to be digested. However, when a person has gastroesophageal reflux disease (GERD), food and stomach acid move back up into the esophagus. When this happens, the esophagus becomes sore and inflamed. Over time, GERD can create small holes (ulcers) in the lining of the esophagus. What are the causes? This condition is caused by a problem with the muscle between the esophagus and the stomach (lower esophageal sphincter, or LES). Normally, the LES muscle closes after food passes through the esophagus to the stomach. When the LES is weakened or abnormal, it does not close properly, and that allows food and stomach acid to go back up into the esophagus. The LES can be weakened by certain dietary substances, medicines, and medical conditions, including:  Tobacco use.  Pregnancy.  Having a hiatal hernia.  Heavy alcohol use.  Certain foods and beverages, such as coffee, chocolate, onions, and peppermint.  What increases the risk? This condition is more likely to develop in:  People who have an increased body weight.  People who have connective tissue disorders.  People who use NSAID medicines.  What are the signs or symptoms? Symptoms of this condition include:  Heartburn.  Difficult or painful swallowing.  The feeling of having a lump in the throat.  Abitter taste in the mouth.  Bad breath.  Having a large amount of saliva.  Having an upset or bloated stomach.  Belching.  Chest pain.  Shortness of breath or wheezing.  Ongoing (chronic) cough or a night-time cough.  Wearing away of tooth enamel.  Weight loss.  Different conditions can cause chest pain. Make sure to see your health care provider if you experience chest pain. How is this diagnosed? Your health care provider will take a medical history and perform a physical exam. To determine if you have mild or severe  GERD, your health care provider may also monitor how you respond to treatment. You may also have other tests, including:  An endoscopy toexamine your stomach and esophagus with a small camera.  A test thatmeasures the acidity level in your esophagus.  A test thatmeasures how much pressure is on your esophagus.  A barium swallow or modified barium swallow to show the shape, size, and functioning of your esophagus.  How is this treated? The goal of treatment is to help relieve your symptoms and to prevent complications. Treatment for this condition may vary depending on how severe your symptoms are. Your health care provider may recommend:  Changes to your diet.  Medicine.  Surgery.  Follow these instructions at home: Diet  Follow a diet as recommended by your health care provider. This may involve avoiding foods and drinks such as: ? Coffee and tea (with or without caffeine). ? Drinks that containalcohol. ? Energy drinks and sports drinks. ? Carbonated drinks or sodas. ? Chocolate and cocoa. ? Peppermint and mint flavorings. ? Garlic and onions. ? Horseradish. ? Spicy and acidic foods, including peppers, chili powder, curry powder, vinegar, hot sauces, and barbecue sauce. ? Citrus fruit juices and citrus fruits, such as oranges, lemons, and limes. ? Tomato-based foods, such as red sauce, chili, salsa, and pizza with red sauce. ? Fried and fatty foods, such as donuts, french fries, potato chips, and high-fat dressings. ? High-fat meats, such as hot dogs and fatty cuts of red and white meats, such as rib eye steak, sausage, ham, and bacon. ? High-fat dairy items, such as whole milk,   butter, and cream cheese.  Eat small, frequent meals instead of large meals.  Avoid drinking large amounts of liquid with your meals.  Avoid eating meals during the 2-3 hours before bedtime.  Avoid lying down right after you eat.  Do not exercise right after you eat. General  instructions  Pay attention to any changes in your symptoms.  Take over-the-counter and prescription medicines only as told by your health care provider. Do not take aspirin, ibuprofen, or other NSAIDs unless your health care provider told you to do so.  Do not use any tobacco products, including cigarettes, chewing tobacco, and e-cigarettes. If you need help quitting, ask your health care provider.  Wear loose-fitting clothing. Do not wear anything tight around your waist that causes pressure on your abdomen.  Raise (elevate) the head of your bed 6 inches (15cm).  Try to reduce your stress, such as with yoga or meditation. If you need help reducing stress, ask your health care provider.  If you are overweight, reduce your weight to an amount that is healthy for you. Ask your health care provider for guidance about a safe weight loss goal.  Keep all follow-up visits as told by your health care provider. This is important. Contact a health care provider if:  You have new symptoms.  You have unexplained weight loss.  You have difficulty swallowing, or it hurts to swallow.  You have wheezing or a persistent cough.  Your symptoms do not improve with treatment.  You have a hoarse voice. Get help right away if:  You have pain in your arms, neck, jaw, teeth, or back.  You feel sweaty, dizzy, or light-headed.  You have chest pain or shortness of breath.  You vomit and your vomit looks like blood or coffee grounds.  You faint.  Your stool is bloody or black.  You cannot swallow, drink, or eat. This information is not intended to replace advice given to you by your health care provider. Make sure you discuss any questions you have with your health care provider. Document Released: 07/20/2005 Document Revised: 03/09/2016 Document Reviewed: 02/04/2015 Elsevier Interactive Patient Education  2017 [employer]vier Inc.  

## 2017-01-12 NOTE — Telephone Encounter (Signed)
Patient has been informed of appointment information/instructions. Also informed per PCP that if pain worsens to go to the nearest ED. She verbalized understanding of all information.

## 2017-01-12 NOTE — Telephone Encounter (Signed)
Results for U/S Christy from imaging To report that results suggesting sludge balls verses non shadowing gall stones, no additional sonographic evidence to suggest acute cholecytitis.

## 2017-01-12 NOTE — Telephone Encounter (Signed)
Referral entered. Patient is scheduled at [facility] on 01/17/2017 at 10:45

## 2017-01-12 NOTE — Progress Notes (Signed)
Pre visit review using our clinic review tool, if applicable. No additional management support is needed unless otherwise documented below in the visit note. 

## 2017-01-12 NOTE — Telephone Encounter (Signed)
noted 

## 2017-01-12 NOTE — Progress Notes (Signed)
Patient ID: Claire Hill, female   DOB: 1971-10-08, 46 y.o.   MRN: [id number]     Subjective:    Patient ID: Claire Hill, female    DOB: Feb 16, 1971, 46 y.o.   MRN: [id number]  Chief Complaint  Patient presents with  . hearburn    vomiting     HPI  Patient is in today for possible acid reflux.  She woke up about two days ago vomiting.  She states that she has a pain in epigastric area.  She took Tylenol for the pain but no relief.  Hurts when she eat or drink anything.  Patient Care Team: Ann Held, DO as PCP - General (Family Medicine) Shon Hough, MD as Consulting Physician (Ophthalmology)   Past Medical History:  Diagnosis Date  . Allergy   . Anemia   . Asthma    pt states no-no inhalers  . COPD (chronic obstructive pulmonary disease) ([place])   . Diabetes mellitus   . Diabetes mellitus without complication ([place])   . Glaucoma   . Herpes    Right eye  . Herpes simplex of eye   . Hyperlipidemia   . OCD (obsessive compulsive disorder)     Past Surgical History:  Procedure Laterality Date  . MASS EXCISION  06/22/2012   Procedure: EXCISION MASS;  Surgeon: Harl Bowie, MD;  Location: [facility];  Service: General;  Laterality: Right;  Excision chronic Right axillary cyst  . NO PAST SURGERIES      Family History  Problem Relation Age of Onset  . Diabetes Father   . Hypertension Mother     Social History   Social History  . Marital status: Married    Spouse name: N/A  . Number of children: N/A  . Years of education: N/A   Occupational History  . factory production    Social History Main Topics  . Smoking status: Never Smoker  . Smokeless tobacco: Never Used  . Alcohol use No  . Drug use: No  . Sexual activity: Yes    Partners: Male   Other Topics Concern  . Not on file   Social History Narrative   ** Merged History Encounter **        Outpatient Medications Prior to Visit  Medication Sig Dispense  Refill  . aspirin (ASPIRIN EC) 81 MG EC tablet Take 1 tablet (81 mg total) by mouth daily. Swallow whole. 30 tablet 12  . blood glucose meter kit and supplies KIT Use a directed twice a day.  E11.8.  Use Relion Meter 1 each 0  . brimonidine (ALPHAGAN) 0.2 % ophthalmic solution   98  . brimonidine-timolol (COMBIGAN) 0.2-0.5 % ophthalmic solution Place 1 drop into the right eye every 12 (twelve) hours. Reported on 03/16/2016    . glimepiride (AMARYL) 1 MG tablet Take 0.5 tablets (0.5 mg total) by mouth daily with breakfast. 30 tablet 4  . glucose blood (RELION GLUCOSE TEST STRIPS) test strip Use as instructed 100 each 5  . metFORMIN (GLUCOPHAGE-XR) 500 MG 24 hr tablet Take 2 tablets (1,000 mg total) by mouth daily. 60 tablet 5  . RELION LANCETS THIN 26G MISC Use as directed twice a day. E11.8 100 each 5  . valACYclovir (VALTREX) 1000 MG tablet Take 1,000 mg by mouth daily.    Marland Kitchen atorvastatin (LIPITOR) 20 MG tablet Take 1 tablet (20 mg total) by mouth daily. 30 tablet 2  . budesonide-formoterol (SYMBICORT) 80-4.5 MCG/ACT inhaler Take 2 puffs  first thing in am and then another 2 puffs about 12 hours later. (Patient not taking: Reported on 01/12/2017) 1 Inhaler 11  . fluticasone (FLONASE) 50 MCG/ACT nasal spray Place 2 sprays into both nostrils daily. (Patient not taking: Reported on 01/12/2017) 16 g 6  . ibuprofen (ADVIL,MOTRIN) 200 MG tablet Take 400-600 mg by mouth every 6 (six) hours as needed (for shortness of breath). Reported on 03/16/2016     No facility-administered medications prior to visit.     No Known Allergies  Review of Systems  Constitutional: Negative for fever and malaise/fatigue.  HENT: Negative for congestion.   Eyes: Negative for blurred vision.  Respiratory: Negative for cough and shortness of breath.   Cardiovascular: Negative for chest pain, palpitations and leg swelling.  Gastrointestinal: Positive for abdominal pain, heartburn and vomiting.       Epigastric    Musculoskeletal: Negative for back pain.  Skin: Negative for rash.  Neurological: Negative for loss of consciousness and headaches.       Objective:    Physical Exam  Constitutional: She is oriented to person, place, and time. She appears well-developed and well-nourished. No distress.  HENT:  Head: Normocephalic and atraumatic.  Eyes: Conjunctivae are normal.  Neck: Normal range of motion. No thyromegaly present.  Cardiovascular: Normal rate and regular rhythm.   Pulmonary/Chest: Effort normal and breath sounds normal. She has no wheezes.  Abdominal: Soft. Bowel sounds are normal. She exhibits no distension and no mass. There is tenderness. There is no rebound and no guarding.  Right upper quadrant pain  Musculoskeletal: Normal range of motion. She exhibits no edema or deformity.  Neurological: She is alert and oriented to person, place, and time.  Skin: Skin is warm and dry. She is not diaphoretic.  Psychiatric: She has a normal mood and affect.    BP 135/86 (BP Location: Left Arm, Cuff Size: Normal)   Pulse 81   Temp 98.1 F (36.7 C) (Oral)   Resp 16   Ht _0  (1.702 m)   Wt 143 lb 6.4 oz (65 kg)   SpO2 100%   BMI 22.46 kg/m  Wt Readings from Last 3 Encounters:  01/12/17 143 lb 6.4 oz (65 kg)  11/14/16 145 lb 6.4 oz (66 kg)  04/18/16 160 lb (72.6 kg)   BP Readings from Last 3 Encounters:  01/12/17 135/86  11/14/16 110/70  04/18/16 116/76     Immunization History  Administered Date(s) Administered  . Hepatitis B 05/01/2013  . Influenza Whole 07/25/2011, 07/31/2012  . Influenza,inj,Quad PF,36+ Mos 07/23/2013  . Pneumococcal Polysaccharide-23 05/01/2013  . Tdap 10/24/2010    Health Maintenance  Topic Date Due  . HIV Screening  05/19/1986  . MAMMOGRAM  06/20/2013  . PAP SMEAR  01/13/2015  . FOOT EXAM  11/08/2015  . INFLUENZA VACCINE  01/21/2017 (Originally 05/24/2016)  . HEMOGLOBIN A1C  05/14/2017  . OPHTHALMOLOGY EXAM  07/12/2017  . URINE MICROALBUMIN   11/14/2017  . PNEUMOCOCCAL POLYSACCHARIDE VACCINE (2) 05/01/2018  . TETANUS/TDAP  10/24/2020    Lab Results  Component Value Date   WBC 4.0 01/12/2017   HGB 13.0 01/12/2017   HCT 39.7 01/12/2017   PLT 254.0 01/12/2017   GLUCOSE 87 01/12/2017   CHOL 217 (H) 11/14/2016   TRIG 164.0 (H) 11/14/2016   HDL 80.10 11/14/2016   LDLDIRECT 173.1 03/30/2012   LDLCALC 104 (H) 11/14/2016   ALT 75 (H) 01/12/2017   AST 33 01/12/2017   NA 141 01/12/2017   K 3.7  01/12/2017   CL 104 01/12/2017   CREATININE 0.67 01/12/2017   BUN 16 01/12/2017   CO2 28 01/12/2017   TSH 1.12 01/13/2012   HGBA1C 5.7 11/14/2016   MICROALBUR 0.3 11/14/2016    Lab Results  Component Value Date   TSH 1.12 01/13/2012   Lab Results  Component Value Date   WBC 4.0 01/12/2017   HGB 13.0 01/12/2017   HCT 39.7 01/12/2017   MCV 91.5 01/12/2017   PLT 254.0 01/12/2017   Lab Results  Component Value Date   NA 141 01/12/2017   K 3.7 01/12/2017   CO2 28 01/12/2017   GLUCOSE 87 01/12/2017   BUN 16 01/12/2017   CREATININE 0.67 01/12/2017   BILITOT 0.6 01/12/2017   ALKPHOS 94 01/12/2017   AST 33 01/12/2017   ALT 75 (H) 01/12/2017   PROT 6.7 01/12/2017   ALBUMIN 4.2 01/12/2017   CALCIUM 10.0 01/12/2017   GFR 100.87 01/12/2017   Lab Results  Component Value Date   CHOL 217 (H) 11/14/2016   Lab Results  Component Value Date   HDL 80.10 11/14/2016   Lab Results  Component Value Date   LDLCALC 104 (H) 11/14/2016   Lab Results  Component Value Date   TRIG 164.0 (H) 11/14/2016   Lab Results  Component Value Date   CHOLHDL 3 11/14/2016   Lab Results  Component Value Date   HGBA1C 5.7 11/14/2016         Assessment & Plan:   Problem List Items Addressed This Visit    None    Visit Diagnoses    RUQ pain    -  Primary   Relevant Medications   omeprazole (PRILOSEC) 40 MG capsule   Other Relevant Orders   US Abdomen Complete (Completed)   CBC with Differential/Platelet (Completed)    Comprehensive metabolic panel (Completed)   Lipase (Completed)   Amylase (Completed)   H. pylori breath test      I have discontinued Ms. Llanas's ibuprofen, budesonide-formoterol, and fluticasone. I am also having her start on omeprazole. Additionally, I am having her maintain her valACYclovir, aspirin, brimonidine-timolol, brimonidine, blood glucose meter kit and supplies, RELION LANCETS THIN 26G, glucose blood, glimepiride, metFORMIN, and atorvastatin.  Meds ordered this encounter  Medications  . atorvastatin (LIPITOR) 20 MG tablet    Sig: Take 1 tablet (20 mg total) by mouth daily.    Dispense:  30 tablet    Refill:  2  . omeprazole (PRILOSEC) 40 MG capsule    Sig: Take 1 capsule (40 mg total) by mouth daily.    Dispense:  30 capsule    Refill:  5    CMA served as scribe during this visit. History, Physical and Plan performed by medical provider. Documentation and orders reviewed and attested to.  Ann Held, DO

## 2017-01-13 ENCOUNTER — Other Ambulatory Visit: Payer: Self-pay | Admitting: Family Medicine

## 2017-01-13 LAB — H. PYLORI BREATH TEST: H. pylori Breath Test: DETECTED — AB

## 2017-01-13 MED ORDER — CLARITHROMYCIN 500 MG PO TABS: 500.0000 mg | ORAL_TABLET | Freq: Two times a day (BID) | ORAL | 0 refills | Status: DC

## 2017-01-13 MED ORDER — OMEPRAZOLE 40 MG PO CPDR: 40.0000 mg | DELAYED_RELEASE_CAPSULE | Freq: Two times a day (BID) | ORAL | 0 refills | Status: DC

## 2017-01-13 MED ORDER — AMOXICILLIN 500 MG PO TABS: ORAL_TABLET | ORAL | 0 refills | Status: DC

## 2017-01-17 ENCOUNTER — Ambulatory Visit: Payer: Self-pay | Admitting: General Surgery

## 2017-01-17 NOTE — H&P (Signed)
Claire Hill 01/17/2017 10:57 AM Location: [facility] Patient #: [id number] DOB: 22-Dec-1970 Married / Language: English / Race: Refused to Report/Unreported Female  History of Present Illness Odis Hollingshead MD; 01/17/2017 11:34 AM) The patient is a 46 year old female.   Note:She is referred by Dr. Etter Sjogren for consultation regarding abdominal pain and gallstones. She states last week she began having some right upper quadrant pain radiating around to the back with nausea and vomiting. She had a bad episode a couple of days ago. Tylenol helps with these. She states it seems to occur after she eats late at night that wakes her up. Her grandmother had gallbladder problems and had to have cholecystectomy. She lives with her friend. Ultrasound demonstrates multiple echogenic shadowing areas consistent with gallstones or gallbladder "sludge balls." She is been sent here for further evaluation and treatment.  Past medical history is notable for hypertension, diabetes, asthma, anemia, gastroesophageal reflux disease, glaucoma.  Diagnostic Studies History (April Staton, [place]; 01/17/2017 10:57 AM) Colonoscopy never Mammogram >3 years ago  Allergies (April Staton, CMA; 01/17/2017 10:57 AM) No Known Drug Allergies 01/17/2017  Medication History (April Staton, CMA; 01/17/2017 10:59 AM) Aspirin (81MG  Tablet, Oral) Active. Atorvastatin Calcium (20MG  Tablet, Oral) Active. Brimonidine Tartrate (0.2% Solution, Ophthalmic) Active. Glimepiride (1MG  Tablet, Oral) Active. MetFORMIN HCl ER (500MG  Tablet ER 24HR, Oral) Active. Omeprazole (40MG  Capsule DR, Oral) Active. Tylenol (500MG  Capsule, Oral) Active. Medications Reconciled  Social History (April Staton, [place]; 01/17/2017 10:57 AM) No alcohol use No caffeine use No drug use Tobacco use Never smoker.  Family History (April Staton, [place]; 01/17/2017 10:57 AM) Alcohol Abuse Brother. Arthritis Mother. Breast  Cancer Family Members In General. Diabetes Mellitus Father. Migraine Headache Sister.  Pregnancy / Birth History (April Staton, [place]; 01/17/2017 10:57 AM) Regular periods  Other Problems (April Staton, CMA; 01/17/2017 10:57 AM) Back Pain Diabetes Mellitus Gastric Ulcer Hypercholesterolemia     Review of Systems (April Staton CMA; 01/17/2017 10:57 AM) General Not Present- Appetite Loss, Chills, Fatigue, Fever, Night Sweats, Weight Gain and Weight Loss. Skin Not Present- Change in Wart/Mole, Dryness, Hives, Jaundice, New Lesions, Non-Healing Wounds, Rash and Ulcer. HEENT Not Present- Earache, Hearing Loss, Hoarseness, Nose Bleed, Oral Ulcers, Ringing in the Ears, Seasonal Allergies, Sinus Pain, Sore Throat, Visual Disturbances, Wears glasses/contact lenses and Yellow Eyes. Breast Not Present- Breast Mass, Breast Pain, Nipple Discharge and Skin Changes. Gastrointestinal Present- Hemorrhoids. Not Present- Abdominal Pain, Bloating, Bloody Stool, Change in Bowel Habits, Chronic diarrhea, Constipation, Difficulty Swallowing, Excessive gas, Gets full quickly at meals, Indigestion, Nausea, Rectal Pain and Vomiting. Female Genitourinary Not Present- Frequency, Nocturia, Painful Urination, Pelvic Pain and Urgency. Musculoskeletal Not Present- Back Pain, Joint Pain, Joint Stiffness, Muscle Pain, Muscle Weakness and Swelling of Extremities. Neurological Not Present- Decreased Memory, Fainting, Headaches, Numbness, Seizures, Tingling, Tremor, Trouble walking and Weakness. Psychiatric Not Present- Anxiety, Bipolar, Change in Sleep Pattern, Depression, Fearful and Frequent crying. Endocrine Not Present- Cold Intolerance, Excessive Hunger, Hair Changes, Heat Intolerance, Hot flashes and New Diabetes. Hematology Not Present- Blood Thinners, Easy Bruising, Excessive bleeding, Gland problems, HIV and Persistent Infections.  Vitals (April Staton CMA; 01/17/2017 11:00 AM) 01/17/2017 10:59 AM Weight:  142.25 lb Height: 68in Body Surface Area: 1.77 m Body Mass Index: 21.63 kg/m  Temp.: 98.27F(Oral)  Pulse: 75 (Regular)  P.OX: 99% (Room air) BP: 120/74 (Sitting, Left Arm, Standard)      Physical Exam Odis Hollingshead MD; 01/17/2017 11:25 AM)  The physical exam findings are as follows: Note:GENERAL APPEARANCE: Thin  female in NAD. Pleasant and cooperative.  EARS, NOSE, MOUTH THROAT: [place]/AT external ears: no lesions or deformities external nose: no lesions or deformities hearing: grossly normal lips: moist, no deformities EYES external: conjunctiva, lids, sclerae normal pupils: equal, round glasses: yes  CV ascultation: RRR, no murmur extremity edema: no  RESP auscultation: breath sounds equal and clear respiratory effort: normal  GASTROINTESTINAL abdomen: Soft, non-tender, non-distended, no masses liver and spleen: not enlarged. hernia: none present scar: none present  MUSCULOSKELETAL station and gait: normal digits/nails: no clubbing or cyanosis muscle strength: grossly normal all extremities deformities: none instability: none  NEUROLOGIC sensation: intact to touch speech: normal  PSYCHIATRIC alertness and orientation: normal mood/affect/behavior: normal judgement and insight: normal    Assessment & Plan Odis Hollingshead MD; 01/17/2017 11:28 AM)  SYMPTOMATIC CHOLELITHIASIS (K80.20) Impression: Ultrasound consistent with gallstones in my opinion and her symptoms are consistent with biliary colic.  Plan: I recommend a laparoscopic cholecystectomy and discussed this with her in detail. I have explained the procedure, risks, and aftercare of cholecystectomy. Risks include but are not limited to bleeding, infection, wound problems, anesthesia, diarrhea, bile leak, injury to common bile duct/liver/intestine. She seems to understand and agrees with the plan.  Jackolyn Confer, M.D.

## 2017-02-15 ENCOUNTER — Other Ambulatory Visit: Payer: BLUE CROSS/BLUE SHIELD

## 2017-02-17 ENCOUNTER — Other Ambulatory Visit (INDEPENDENT_AMBULATORY_CARE_PROVIDER_SITE_OTHER): Payer: BLUE CROSS/BLUE SHIELD

## 2017-02-17 DIAGNOSIS — E119 Type 2 diabetes mellitus without complications: Secondary | ICD-10-CM

## 2017-02-17 DIAGNOSIS — E785 Hyperlipidemia, unspecified: Secondary | ICD-10-CM

## 2017-02-17 LAB — LIPID PANEL
CHOL/HDL RATIO: 2
Cholesterol: 156 mg/dL (ref 0–200)
HDL: 83.7 mg/dL (ref >39.00)
LDL Cholesterol: 60 mg/dL (ref 0–99)
NONHDL: 72.21
Triglycerides: 60 mg/dL (ref 0.0–149.0)
VLDL: 12 mg/dL (ref 0.0–40.0)

## 2017-02-17 LAB — COMPREHENSIVE METABOLIC PANEL
ALT: 20 U/L (ref 0–35)
AST: 23 U/L (ref 0–37)
Albumin: 4.2 g/dL (ref 3.5–5.2)
Alkaline Phosphatase: 90 U/L (ref 39–117)
BUN: 21 mg/dL (ref 6–23)
CHLORIDE: 106 meq/L (ref 96–112)
CO2: 30 meq/L (ref 19–32)
CREATININE: 0.73 mg/dL (ref 0.40–1.20)
Calcium: 9.9 mg/dL (ref 8.4–10.5)
GFR: 91.32 mL/min (ref >60.00)
GLUCOSE: 118 mg/dL — AB (ref 70–99)
POTASSIUM: 3.9 meq/L (ref 3.5–5.1)
SODIUM: 142 meq/L (ref 135–145)
Total Bilirubin: 0.5 mg/dL (ref 0.2–1.2)
Total Protein: 6.7 g/dL (ref 6.0–8.3)

## 2017-02-17 LAB — HEMOGLOBIN A1C: HEMOGLOBIN A1C: 6.3 % (ref 4.6–6.5)

## 2017-03-18 ENCOUNTER — Other Ambulatory Visit: Payer: Self-pay | Admitting: Endocrinology

## 2017-03-18 NOTE — Telephone Encounter (Signed)
Please refill x 3 mos Ov is due 

## 2017-03-27 ENCOUNTER — Other Ambulatory Visit: Payer: Self-pay | Admitting: Family Medicine

## 2017-03-27 DIAGNOSIS — E785 Hyperlipidemia, unspecified: Secondary | ICD-10-CM

## 2017-03-27 MED ORDER — ATORVASTATIN CALCIUM 20 MG PO TABS: 20.0000 mg | ORAL_TABLET | Freq: Every day | ORAL | 2 refills | Status: DC

## 2017-03-31 ENCOUNTER — Telehealth: Payer: Self-pay | Admitting: Family Medicine

## 2017-03-31 NOTE — Telephone Encounter (Signed)
Caller name: Jenny Reichmann Relationship to patient: Spouse Can be reached: [phone] Pharmacy:  Reason for call: Request call back to discuss surgery that patient needs to have for kidney stones

## 2017-04-03 NOTE — Telephone Encounter (Signed)
Left message on machine to call back  

## 2017-04-05 NOTE — Telephone Encounter (Signed)
Patient husband stated that he could not understand wife about what is going on.  He states that she was scared to get surgery done and that she needed money up front.  Husband updated about she may need gallbladder surgery.  He will call [facility] [place] [facility] for an update.

## 2017-04-24 ENCOUNTER — Other Ambulatory Visit: Payer: Self-pay | Admitting: Endocrinology

## 2017-05-15 ENCOUNTER — Ambulatory Visit (INDEPENDENT_AMBULATORY_CARE_PROVIDER_SITE_OTHER): Payer: BLUE CROSS/BLUE SHIELD | Admitting: Family Medicine

## 2017-05-15 ENCOUNTER — Encounter: Payer: Self-pay | Admitting: Family Medicine

## 2017-05-15 VITALS — BP 122/80 | HR 84 | Temp 98.2°F | Resp 16 | Ht 68.0 in | Wt 144.6 lb

## 2017-05-15 DIAGNOSIS — R200 Anesthesia of skin: Secondary | ICD-10-CM

## 2017-05-15 DIAGNOSIS — Z0000 Encounter for general adult medical examination without abnormal findings: Secondary | ICD-10-CM | POA: Diagnosis not present

## 2017-05-15 DIAGNOSIS — E1165 Type 2 diabetes mellitus with hyperglycemia: Secondary | ICD-10-CM

## 2017-05-15 DIAGNOSIS — E1151 Type 2 diabetes mellitus with diabetic peripheral angiopathy without gangrene: Secondary | ICD-10-CM

## 2017-05-15 DIAGNOSIS — IMO0002 Reserved for concepts with insufficient information to code with codable children: Secondary | ICD-10-CM

## 2017-05-15 DIAGNOSIS — E785 Hyperlipidemia, unspecified: Secondary | ICD-10-CM | POA: Diagnosis not present

## 2017-05-15 DIAGNOSIS — J301 Allergic rhinitis due to pollen: Secondary | ICD-10-CM

## 2017-05-15 DIAGNOSIS — I10 Essential (primary) hypertension: Secondary | ICD-10-CM

## 2017-05-15 LAB — LIPID PANEL
CHOL/HDL RATIO: 2
Cholesterol: 180 mg/dL (ref 0–200)
HDL: 101.6 mg/dL (ref >39.00)
LDL Cholesterol: 70 mg/dL (ref 0–99)
NonHDL: 78.49
TRIGLYCERIDES: 44 mg/dL (ref 0.0–149.0)
VLDL: 8.8 mg/dL (ref 0.0–40.0)

## 2017-05-15 LAB — CBC WITH DIFFERENTIAL/PLATELET
BASOS ABS: 0 10*3/uL (ref 0.0–0.1)
Basophils Relative: 1.4 % (ref 0.0–3.0)
EOS ABS: 0.2 10*3/uL (ref 0.0–0.7)
Eosinophils Relative: 5.1 % — ABNORMAL HIGH (ref 0.0–5.0)
HCT: 40.6 % (ref 36.0–46.0)
Hemoglobin: 13.1 g/dL (ref 12.0–15.0)
Lymphocytes Relative: 45.6 % (ref 12.0–46.0)
Lymphs Abs: 1.5 10*3/uL (ref 0.7–4.0)
MCHC: 32.2 g/dL (ref 30.0–36.0)
MCV: 92.9 fl (ref 78.0–100.0)
MONO ABS: 0.2 10*3/uL (ref 0.1–1.0)
Monocytes Relative: 5.5 % (ref 3.0–12.0)
NEUTROS ABS: 1.4 10*3/uL (ref 1.4–7.7)
NEUTROS PCT: 42.4 % — AB (ref 43.0–77.0)
Platelets: 262 10*3/uL (ref 150.0–400.0)
RBC: 4.37 Mil/uL (ref 3.87–5.11)
RDW: 13.9 % (ref 11.5–15.5)
WBC: 3.4 10*3/uL — ABNORMAL LOW (ref 4.0–10.5)

## 2017-05-15 LAB — COMPREHENSIVE METABOLIC PANEL
ALT: 31 U/L (ref 0–35)
AST: 28 U/L (ref 0–37)
Albumin: 4.4 g/dL (ref 3.5–5.2)
Alkaline Phosphatase: 91 U/L (ref 39–117)
BUN: 15 mg/dL (ref 6–23)
CALCIUM: 10.2 mg/dL (ref 8.4–10.5)
CHLORIDE: 104 meq/L (ref 96–112)
CO2: 28 meq/L (ref 19–32)
Creatinine, Ser: 0.73 mg/dL (ref 0.40–1.20)
GFR: 91.23 mL/min (ref >60.00)
GLUCOSE: 103 mg/dL — AB (ref 70–99)
POTASSIUM: 3.9 meq/L (ref 3.5–5.1)
Sodium: 142 mEq/L (ref 135–145)
Total Bilirubin: 0.6 mg/dL (ref 0.2–1.2)
Total Protein: 6.9 g/dL (ref 6.0–8.3)

## 2017-05-15 LAB — POC URINALSYSI DIPSTICK (AUTOMATED)
BILIRUBIN UA: NEGATIVE
Glucose, UA: NEGATIVE
KETONES UA: NEGATIVE
Leukocytes, UA: NEGATIVE
Nitrite, UA: NEGATIVE
PROTEIN UA: NEGATIVE
RBC UA: NEGATIVE
Spec Grav, UA: 1.02 (ref 1.010–1.025)
Urobilinogen, UA: 0.2 E.U./dL
pH, UA: 6 (ref 5.0–8.0)

## 2017-05-15 LAB — TSH: TSH: 1.36 u[IU]/mL (ref 0.35–4.50)

## 2017-05-15 LAB — HEMOGLOBIN A1C: Hgb A1c MFr Bld: 6.3 % (ref 4.6–6.5)

## 2017-05-15 MED ORDER — LEVOCETIRIZINE DIHYDROCHLORIDE 5 MG PO TABS: 5.0000 mg | ORAL_TABLET | Freq: Every evening | ORAL | 5 refills | Status: DC

## 2017-05-15 MED ORDER — FLUTICASONE PROPIONATE 50 MCG/ACT NA SUSP: 2.0000 | Freq: Every day | NASAL | 6 refills | Status: DC

## 2017-05-15 MED ORDER — NONFORMULARY OR COMPOUNDED ITEM: 5 refills | Status: DC

## 2017-05-15 NOTE — Patient Instructions (Addendum)
Please call [facility] to schedule your annual mammogram [phone] Please call Dr Cordelia Pen office to make an appointment   Preventive Care 40-64 Years, Female Preventive care refers to lifestyle choices and visits with your health care provider that can promote health and wellness. What does preventive care include?  A yearly physical exam. This is also called an annual well check.  Dental exams once or twice a year.  Routine eye exams. Ask your health care provider how often you should have your eyes checked.  Personal lifestyle choices, including: ? Daily care of your teeth and gums. ? Regular physical activity. ? Eating a healthy diet. ? Avoiding tobacco and drug use. ? Limiting alcohol use. ? Practicing safe sex. ? Taking low-dose aspirin daily starting at age 1. ? Taking vitamin and mineral supplements as recommended by your health care provider. What happens during an annual well check? The services and screenings done by your health care provider during your annual well check will depend on your age, overall health, lifestyle risk factors, and family history of disease. Counseling Your health care provider may ask you questions about your:  Alcohol use.  Tobacco use.  Drug use.  Emotional well-being.  Home and relationship well-being.  Sexual activity.  Eating habits.  Work and work Statistician.  Method of birth control.  Menstrual cycle.  Pregnancy history.  Screening You may have the following tests or measurements:  Height, weight, and BMI.  Blood pressure.  Lipid and cholesterol levels. These may be checked every 5 years, or more frequently if you are over 14 years old.  Skin check.  Lung cancer screening. You may have this screening every year starting at age 79 if you have a 30-pack-year history of smoking and currently smoke or have quit within the past 15 years.  Fecal occult blood test (FOBT) of the stool. You may have this test  every year starting at age 28.  Flexible sigmoidoscopy or colonoscopy. You may have a sigmoidoscopy every 5 years or a colonoscopy every 10 years starting at age 55.  Hepatitis C blood test.  Hepatitis B blood test.  Sexually transmitted disease (STD) testing.  Diabetes screening. This is done by checking your blood sugar (glucose) after you have not eaten for a while (fasting). You may have this done every 1-3 years.  Mammogram. This may be done every 1-2 years. Talk to your health care provider about when you should start having regular mammograms. This may depend on whether you have a family history of breast cancer.  BRCA-related cancer screening. This may be done if you have a family history of breast, ovarian, tubal, or peritoneal cancers.  Pelvic exam and Pap test. This may be done every 3 years starting at age 88. Starting at age 54, this may be done every 5 years if you have a Pap test in combination with an HPV test.  Bone density scan. This is done to screen for osteoporosis. You may have this scan if you are at high risk for osteoporosis.  Discuss your test results, treatment options, and if necessary, the need for more tests with your health care provider. Vaccines Your health care provider may recommend certain vaccines, such as:  Influenza vaccine. This is recommended every year.  Tetanus, diphtheria, and acellular pertussis (Tdap, Td) vaccine. You may need a Td booster every 10 years.  Varicella vaccine. You may need this if you have not been vaccinated.  Zoster vaccine. You may need this after age 81.  Measles, mumps, and rubella (MMR) vaccine. You may need at least one dose of MMR if you were born in 1957 or later. You may also need a second dose.  Pneumococcal 13-valent conjugate (PCV13) vaccine. You may need this if you have certain conditions and were not previously vaccinated.  Pneumococcal polysaccharide (PPSV23) vaccine. You may need one or two doses if you  smoke cigarettes or if you have certain conditions.  Meningococcal vaccine. You may need this if you have certain conditions.  Hepatitis A vaccine. You may need this if you have certain conditions or if you travel or work in places where you may be exposed to hepatitis A.  Hepatitis B vaccine. You may need this if you have certain conditions or if you travel or work in places where you may be exposed to hepatitis B.  Haemophilus influenzae type b (Hib) vaccine. You may need this if you have certain conditions.  Talk to your health care provider about which screenings and vaccines you need and how often you need them. This information is not intended to replace advice given to you by your health care provider. Make sure you discuss any questions you have with your health care provider. Document Released: 11/06/2015 Document Revised: 06/29/2016 Document Reviewed: 08/11/2015 Elsevier Interactive Patient Education  2017 [employer].

## 2017-05-15 NOTE — Progress Notes (Signed)
Subjective:   I acted as a Education administrator for Dr. Carollee Herter.  Guerry Bruin, CMA   Claire Hill is a 46 y.o. female and is here for a comprehensive physical exam. The patient reports seasonal allergies -- she c/o sneezing and dry nose  She also c/o numbness in her feet .  No other complaints    Social History   Social History  . Marital status: Married    Spouse name: N/A  . Number of children: N/A  . Years of education: N/A   Occupational History  . factory production    Social History Main Topics  . Smoking status: Never Smoker  . Smokeless tobacco: Never Used  . Alcohol use No  . Drug use: No  . Sexual activity: Yes    Partners: Male   Other Topics Concern  . Not on file   Social History Narrative   ** Merged History Encounter **    Exercise-no      Health Maintenance  Topic Date Due  . HIV Screening  05/19/1986  . MAMMOGRAM  06/20/2013  . PAP SMEAR  01/13/2015  . INFLUENZA VACCINE  05/24/2017  . OPHTHALMOLOGY EXAM  07/12/2017  . HEMOGLOBIN A1C  08/19/2017  . URINE MICROALBUMIN  11/14/2017  . PNEUMOCOCCAL POLYSACCHARIDE VACCINE (2) 05/01/2018  . FOOT EXAM  05/15/2018  . TETANUS/TDAP  10/24/2020    The following portions of the patient's history were reviewed and updated as appropriate:  She  has a past medical history of Allergy; Anemia; Asthma; COPD (chronic obstructive pulmonary disease) ([place]); Diabetes mellitus; Diabetes mellitus without complication ([place]); Glaucoma; Herpes; Herpes simplex of eye; Hyperlipidemia; and OCD (obsessive compulsive disorder). She  does not have any pertinent problems on file. She  has a past surgical history that includes No past surgeries and Mass excision (06/22/2012). Her family history includes Diabetes in her father; Hypertension in her mother. She  reports that she has never smoked. She has never used smokeless tobacco. She reports that she does not drink alcohol or use drugs. She has a current medication list which includes the  following prescription(s): aspirin, atorvastatin, blood glucose meter kit and supplies, brimonidine, brimonidine-timolol, glimepiride, glucose blood, metformin, omeprazole, relion lancets thin 26g, valacyclovir, fluticasone, levocetirizine, and NONFORMULARY OR COMPOUNDED ITEM. Current Outpatient Prescriptions on File Prior to Visit  Medication Sig Dispense Refill  . aspirin (ASPIRIN EC) 81 MG EC tablet Take 1 tablet (81 mg total) by mouth daily. Swallow whole. 30 tablet 12  . atorvastatin (LIPITOR) 20 MG tablet Take 1 tablet (20 mg total) by mouth daily. 30 tablet 2  . blood glucose meter kit and supplies KIT Use a directed twice a day.  E11.8.  Use Relion Meter 1 each 0  . brimonidine (ALPHAGAN) 0.2 % ophthalmic solution   98  . brimonidine-timolol (COMBIGAN) 0.2-0.5 % ophthalmic solution Place 1 drop into the right eye every 12 (twelve) hours. Reported on 03/16/2016    . glimepiride (AMARYL) 1 MG tablet Take 0.5 tablets (0.5 mg total) by mouth daily with breakfast. 30 tablet 4  . glucose blood (RELION GLUCOSE TEST STRIPS) test strip Use as instructed 100 each 5  . metFORMIN (GLUCOPHAGE-XR) 500 MG 24 hr tablet Take 2 tablets (1,000 mg total) by mouth daily. 60 tablet 5  . omeprazole (PRILOSEC) 40 MG capsule Take 1 capsule (40 mg total) by mouth daily. 30 capsule 5  . RELION LANCETS THIN 26G MISC Use as directed twice a day. E11.8 100 each 5  . valACYclovir (VALTREX) 1000  MG tablet Take 1,000 mg by mouth daily.     No current facility-administered medications on file prior to visit.    She has No Known Allergies..  Review of Systems Review of Systems  Constitutional: Negative for activity change, appetite change and fatigue.  HENT: Negative for hearing loss, congestion, tinnitus and ear discharge.  dentist q35mEyes: Negative for visual disturbance (see optho q1y -- vision corrected to 20/20 with glasses).  Respiratory: Negative for cough, chest tightness and shortness of breath.    Cardiovascular: Negative for chest pain, palpitations and leg swelling.  Gastrointestinal: Negative for abdominal pain, diarrhea, constipation and abdominal distention.  Genitourinary: Negative for urgency, frequency, decreased urine volume and difficulty urinating.  Musculoskeletal: Negative for back pain, arthralgias and gait problem.  Skin: Negative for color change, pallor and rash.  Neurological: Negative for dizziness, light-headedness, numbness and headaches.  Hematological: Negative for adenopathy. Does not bruise/bleed easily.  Psychiatric/Behavioral: Negative for suicidal ideas, confusion, sleep disturbance, self-injury, dysphoric mood, decreased concentration and agitation.       Objective:    BP 122/80 (BP Location: Right Arm, Cuff Size: Normal)   Pulse 84   Temp 98.2 F (36.8 C) (Oral)   Resp 16   Ht '5\' 8"'$  (1.727 m)   Wt 144 lb 9.6 oz (65.6 kg)   LMP  (LMP Unknown) Comment: patient could not remember last period 05/15/17  SpO2 98%   BMI 21.99 kg/m  General appearance: alert, cooperative, appears stated age and no distress Head: Normocephalic, without obvious abnormality, atraumatic Eyes: conjunctivae/corneas clear. PERRL, EOM's intact. Fundi benign. Ears: normal TM's and external ear canals both ears Nose: Nares normal. Septum midline. Mucosa normal. No drainage or sinus tenderness. Throat: lips, mucosa, and tongue normal; teeth and gums normal Neck: no adenopathy, no carotid bruit, no JVD, supple, symmetrical, trachea midline and thyroid not enlarged, symmetric, no tenderness/mass/nodules Back: symmetric, no curvature. ROM normal. No CVA tenderness. Lungs: clear to auscultation bilaterally Breasts: gyn Heart: regular rate and rhythm, S1, S2 normal, no murmur, click, rub or gallop Abdomen: soft, non-tender; bowel sounds normal; no masses,  no organomegaly Pelvic: deferred--gyn Extremities: extremities normal, atraumatic, no cyanosis or edema Pulses: 2+ and  symmetric Skin: Skin color, texture, turgor normal. No rashes or lesions Lymph nodes: Cervical, supraclavicular, and axillary nodes normal. Neurologic: Alert and oriented X 3, normal strength and tone. Normal symmetric reflexes. Normal coordination and gait   Sensory exam of the foot is normal, tested with the monofilament. Good pulses, no lesions or ulcers, good peripheral pulses.--- pt c/o numness in both big toes   Assessment:    Healthy female exam.      Plan:    f/u gyn Check labs See After Visit Summary for Counseling Recommendations     1. Hyperlipidemia LDL goal <100 Tolerating statin, encouraged heart healthy diet, avoid trans fats, minimize simple carbs and saturated fats. Increase exercise as tolerated - Comprehensive metabolic panel - Lipid panel  2. Essential hypertension Well controlled, no changes to meds. Encouraged heart healthy diet such as the DASH diet and exercise as tolerated.  - POCT Urinalysis Dipstick (Automated) - CBC with Differential/Platelet - Comprehensive metabolic panel - Lipid panel - TSH - Hemoglobin A1c  3. DM (diabetes mellitus) type II uncontrolled, periph vascular disorder (HCC) hgba1c acceptable, minimize simple carbs. Increase exercise as tolerated. Continue current meds - Comprehensive metabolic panel - Hemoglobin A1c - NONFORMULARY OR COMPOUNDED ITEM; Alcohol pads  #1 box  Use as directed  Dispense: 1 each;  Refill: 5 - Ambulatory referral to Podiatry  4. Preventative health care See above - POCT Urinalysis Dipstick (Automated) - CBC with Differential/Platelet - Comprehensive metabolic panel - Lipid panel - TSH - Hemoglobin A1c  5. Numbness of toes  - Ambulatory referral to Podiatry  6. Seasonal allergic rhinitis due to pollen  - fluticasone (FLONASE) 50 MCG/ACT nasal spray; Place 2 sprays into both nostrils daily.  Dispense: 16 g; Refill: 6 - levocetirizine (XYZAL) 5 MG tablet; Take 1 tablet (5 mg total) by mouth every  evening.  Dispense: 30 tablet; Refill: 5

## 2017-05-18 ENCOUNTER — Other Ambulatory Visit: Payer: Self-pay | Admitting: Family Medicine

## 2017-05-18 DIAGNOSIS — E119 Type 2 diabetes mellitus without complications: Secondary | ICD-10-CM

## 2017-05-18 DIAGNOSIS — E785 Hyperlipidemia, unspecified: Secondary | ICD-10-CM

## 2017-05-30 ENCOUNTER — Encounter: Payer: Self-pay | Admitting: Podiatry

## 2017-05-30 ENCOUNTER — Ambulatory Visit (INDEPENDENT_AMBULATORY_CARE_PROVIDER_SITE_OTHER): Payer: BLUE CROSS/BLUE SHIELD | Admitting: Podiatry

## 2017-05-30 DIAGNOSIS — M2142 Flat foot [pes planus] (acquired), left foot: Secondary | ICD-10-CM | POA: Diagnosis not present

## 2017-05-30 DIAGNOSIS — M2141 Flat foot [pes planus] (acquired), right foot: Secondary | ICD-10-CM | POA: Diagnosis not present

## 2017-05-30 DIAGNOSIS — M21619 Bunion of unspecified foot: Secondary | ICD-10-CM | POA: Diagnosis not present

## 2017-05-30 DIAGNOSIS — R200 Anesthesia of skin: Secondary | ICD-10-CM | POA: Diagnosis not present

## 2017-05-30 NOTE — Patient Instructions (Signed)
Bunion A bunion is a bump on the base of the big toe that forms when the bones of the big toe joint move out of position. Bunions may be small at first, but they often get larger over time. The can make walking painful. What are the causes? A bunion may be caused by:  Wearing narrow or pointed shoes that force the big toe to press against the other toes.  Abnormal foot development that causes the foot to roll inward (pronate).  Changes in the foot that are caused by certain diseases, such as rheumatoid arthritis and polio.  A foot injury.  What increases the risk? The following factors may make you more likely to develop this condition:  Wearing shoes that squeeze the toes together.  Having certain diseases, such as: ? Rheumatoid arthritis. ? Polio. ? Cerebral palsy.  Having family members who have bunions.  Being born with a foot deformity, such as flat feet or low arches.  Doing activities that put a lot of pressure on the feet, such as ballet dancing.  What are the signs or symptoms? The main symptom of a bunion is a noticeable bump on the big toe. Other symptoms may include:  Pain.  Swelling around the big toe.  Redness and inflammation.  Thick or hardened skin on the big toe or between the toes.  Stiffness or loss of motion in the big toe.  Trouble with walking.  How is this diagnosed? A bunion may be diagnosed based on your symptoms, medical history, and activities. You may have tests, such as:  X-rays. These allow your health care provider to check the position of the bones in your foot and look for damage to your joint. They also help your health care provider to determine the severity of your bunion and the best way to treat it.  Joint aspiration. In this test, a sample of fluid is removed from the toe joint. This test, which may be done if you are in a lot of pain, helps to rule out diseases that cause painful swelling of the joints, such as  arthritis.  How is this treated? There is no cure for a bunion, but treatment can help to prevent a bunion from getting worse. Treatment depends on the severity of your symptoms. Your health care provider may recommend:  Wearing shoes that have a wide toe box.  Using bunion pads to cushion the affected area.  Taping your toes together to keep them in a normal position.  Placing a device inside your shoe (orthotics) to help reduce pressure on your toe joint.  Taking medicine to ease pain, inflammation, and swelling.  Applying heat or ice to the affected area.  Doing stretching exercises.  Surgery to remove scar tissue and move the toes back into their normal position. This treatment is rare.  Follow these instructions at home:  Support your toe joint with proper footwear, shoe padding, or taping as told by your health care provider.  Take over-the-counter and prescription medicines only as told by your health care provider.  If directed, apply ice to the injured area: ? Put ice in a plastic bag. ? Place a towel between your skin and the bag. ? Leave the ice on for 20 minutes, 2-3 times per day.  If directed, apply heat to the affected area before you exercise. Use the heat source that your health care provider recommends, such as a moist heat pack or a heating pad. ? Place a towel between your   skin and the heat source. ? Leave the heat on for 20-30 minutes. ? Remove the heat if your skin turns bright red. This is especially important if you are unable to feel pain, heat, or cold. You may have a greater risk of getting burned.  Do exercises as told by your health care provider.  Keep all follow-up visits as told by your health care provider. Contact a health care provider if:  Your symptoms get worse.  Your symptoms do not improve in 2 weeks. Get help right away if:  You have severe pain and trouble with walking. This information is not intended to replace advice given  to you by your health care provider. Make sure you discuss any questions you have with your health care provider. Document Released: 10/10/2005 Document Revised: 03/17/2016 Document Reviewed: 05/10/2015 Elsevier Interactive Patient Education  2018 [employer]   Diabetes and Foot Care Diabetes may cause you to have problems because of poor blood supply (circulation) to your feet and legs. This may cause the skin on your feet to become thinner, break easier, and heal more slowly. Your skin may become dry, and the skin may peel and crack. You may also have nerve damage in your legs and feet causing decreased feeling in them. You may not notice minor injuries to your feet that could lead to infections or more serious problems. Taking care of your feet is one of the most important things you can do for yourself. Follow these instructions at home:  Wear shoes at all times, even in the house. Do not go barefoot. Bare feet are easily injured.  Check your feet daily for blisters, cuts, and redness. If you cannot see the bottom of your feet, use a mirror or ask someone for help.  Wash your feet with warm water (do not use hot water) and mild soap. Then pat your feet and the areas between your toes until they are completely dry. Do not soak your feet as this can dry your skin.  Apply a moisturizing lotion or petroleum jelly (that does not contain alcohol and is unscented) to the skin on your feet and to dry, brittle toenails. Do not apply lotion between your toes.  Trim your toenails straight across. Do not dig under them or around the cuticle. File the edges of your nails with an emery board or nail file.  Do not cut corns or calluses or try to remove them with medicine.  Wear clean socks or stockings every day. Make sure they are not too tight. Do not wear knee-high stockings since they may decrease blood flow to your legs.  Wear shoes that fit properly and have enough cushioning. To break in new  shoes, wear them for just a few hours a day. This prevents you from injuring your feet. Always look in your shoes before you put them on to be sure there are no objects inside.  Do not cross your legs. This may decrease the blood flow to your feet.  If you find a minor scrape, cut, or break in the skin on your feet, keep it and the skin around it clean and dry. These areas may be cleansed with mild soap and water. Do not cleanse the area with peroxide, alcohol, or iodine.  When you remove an adhesive bandage, be sure not to damage the skin around it.  If you have a wound, look at it several times a day to make sure it is healing.  Do not use   heating pads or hot water bottles. They may burn your skin. If you have lost feeling in your feet or legs, you may not know it is happening until it is too late.  Make sure your health care provider performs a complete foot exam at least annually or more often if you have foot problems. Report any cuts, sores, or bruises to your health care provider immediately. Contact a health care provider if:  You have an injury that is not healing.  You have cuts or breaks in the skin.  You have an ingrown nail.  You notice redness on your legs or feet.  You feel burning or tingling in your legs or feet.  You have pain or cramps in your legs and feet.  Your legs or feet are numb.  Your feet always feel cold. Get help right away if:  There is increasing redness, swelling, or pain in or around a wound.  There is a red line that goes up your leg.  Pus is coming from a wound.  You develop a fever or as directed by your health care provider.  You notice a bad smell coming from an ulcer or wound. This information is not intended to replace advice given to you by your health care provider. Make sure you discuss any questions you have with your health care provider. Document Released: 10/07/2000 Document Revised: 03/17/2016 Document Reviewed:  03/19/2013 Elsevier Interactive Patient Education  2017 [employer]   

## 2017-05-30 NOTE — Progress Notes (Signed)
   Subjective:    Patient ID: Claire Hill, female    DOB: 04-07-1971, 46 y.o.   MRN: [id number]  HPI  [name] Presents the also concerns of a "sleeping sensation" to the left big toe and she points along the medial aspect of the left hallux starting the IPJ which she gets the sensation. She states that she is no other areas of numbness to her feet or any tingling she denies any sharp pains. Says been ongoing for about 6 months. She states it may have started with shoes and her job however she is recently changed her job. She said no recent treatment for her. She is diabetic and last A1c was 6.3. She denies any back issues, hip issues or knee pain.  Review of Systems  All other systems reviewed and are negative.      Objective:   Physical Exam General: AAO x3, NAD  Dermatological: Skin is warm, dry and supple bilateral. Nails x 10 are well manicured; remaining integument appears unremarkable at this time. There are no open sores, no preulcerative lesions, no rash or signs of infection present.  Vascular: Dorsalis Pedis artery and Posterior Tibial artery pedal pulses are 2/4 bilateral with immedate capillary fill time.  There is no pain with calf compression, swelling, warmth, erythema.   Neruologic: Grossly intact via light touch bilateral.  Protective threshold with Semmes Wienstein monofilament intact to all pedal sites bilateral. Negative tinel sign. Subjectively she has numbness along the medial aspect left hallux starting at the medial IPJ to the distal portion the toe. Sensation is normal to the lateral portion of the toe and this appears to be very localized to the area. Under this area the numbness she does have a small hyperkeratotic lesion of the hallux IPJ.  Musculoskeletal: There is a decrease in medial arch height upon weightbearing. Equinus is present. There is no area pinpoint bony tenderness or pain the vibratory. Muscular strength 5/5 in all groups tested  bilateral.  Gait: Unassisted, Nonantalgic.   Assessment & Plan:  46 year old female with neuritis left foot which I believe is more from biomechanical changes -Treatment options discussed including all alternatives, risks, and complications -Etiology of symptoms were discussed -Offloading pads were dispensed with a hallux IPJ. -Also dispensed a power step inserts to wear inside of her shoe. I think that a lot of her symptoms are biomechanical in nature over this will help. Also discussed the changed shoes. -RTC 2 months or sooner if needed.  Celesta Gentile, DPM

## 2017-06-14 ENCOUNTER — Encounter: Payer: Self-pay | Admitting: Obstetrics & Gynecology

## 2017-06-14 ENCOUNTER — Ambulatory Visit: Payer: BLUE CROSS/BLUE SHIELD | Admitting: Obstetrics & Gynecology

## 2017-06-14 ENCOUNTER — Ambulatory Visit (INDEPENDENT_AMBULATORY_CARE_PROVIDER_SITE_OTHER): Payer: BLUE CROSS/BLUE SHIELD | Admitting: Obstetrics & Gynecology

## 2017-06-14 VITALS — BP 132/84 | Ht 66.0 in | Wt 158.0 lb

## 2017-06-14 DIAGNOSIS — Z3009 Encounter for other general counseling and advice on contraception: Secondary | ICD-10-CM

## 2017-06-14 DIAGNOSIS — Z01419 Encounter for gynecological examination (general) (routine) without abnormal findings: Secondary | ICD-10-CM | POA: Diagnosis not present

## 2017-06-14 DIAGNOSIS — N911 Secondary amenorrhea: Secondary | ICD-10-CM

## 2017-06-14 DIAGNOSIS — Z1151 Encounter for screening for human papillomavirus (HPV): Secondary | ICD-10-CM

## 2017-06-14 LAB — TSH: TSH: 1.22 mIU/L

## 2017-06-14 NOTE — Patient Instructions (Signed)
1. Encounter for routine gynecological examination with Papanicolaou smear of cervix Normal gyn exam.  Pap/HPV done.  Breasts wnl.  2. Encounter for other general counseling or advice on contraception Abstinence.  Will use condoms as needed.  3. Secondary amenorrhea Abstinence x age 46.  No pelvic pain.  No Menopausal Sx.  R/O Menopause/Chronic anovulation.  Labs today, f/u Pelvic US.  Management per results. - FSH - TSH - Prolactin - US Transvaginal Non-OB; Future  [name], fue Engineer, drilling conocerla!  Voy a informarla de sus resultados pronto y verla de nuevo para la echographia.  Amenorrea secundaria (Secondary Amenorrhea) La amenorrea secundaria es la detencin del flujo menstrual durante 3-6 meses en una mujer que previamente tena sus perodos. Hay muchas causas posibles: La mayora de las causas no son graves. Generalmente, al tratar el problema subyacente que causa la detencin de la [place], podr volver a tener sus perodos normales. CAUSAS Algunas causas comunes de la falta de menstruacin son:  Desnutricin.  Bajo nivel de Dispensing optician (hipoglucemia).  Enfermedad poliqustica de los ovarios.  Estrs o miedos.  Gwendlyn Deutscher.  Desequilibrio hormonal.  Insuficiencia ovrica.  Medicamentos.  Obesidad extrema.  Fibrosis qustica.  Reduccin de peso drstica por cualquier causa.  Menopausia precoz.  Extirpacin de los ovarios o del tero.  Anticonceptivos.  Enfermedades.  Enfermedades de larga duracin (crnicas).  Sndrome de [place].  Problemas de tiroides.  Pldoras, parches o anillos vaginales para el control de la natalidad. FACTORES DE RIESGO Puede tener ms riesgo de amenorrea secundaria si:  Tiene una historia familiar de este problema.  Sufre un trastorno alimentario.  Realiza entrenamiento deportivo. DIAGNSTICO Este diagnstico la realiza el mdico por medio de la historia clnica y el examen fsico. Incluir un  examen plvico para verificar si hay problemas en los rganos reproductores. Debe descartarse la posibilidad de embarazo. Generalmente se indicarn diferentes anlisis de sangre para medir diferentes tipos de hormonas en el organismo. Le indicarn anlisis de [place]. Le harn algunos estudios especializados (ecografas, tomografa computada, resonancia magntica o histeroscopa) y tambin medirn su ndice de masa corporal [facility]). TRATAMIENTO El tratamiento depende de la causa de la [place]. Si hay un trastorno de Youth worker, deber tratarse con la dieta y la terapia [place]. Los trastornos crnicos pueden mejorar con el tratamiento de la enfermedad. La amenorrea puede corregirse con medicamentos, cambios en el estilo de vida o con [place]. Si la amenorrea no puede corregirse, algunas veces es posible crear una falsa menstruacin con medicamentos. INSTRUCCIONES PARA EL CUIDADO EN EL HOGAR  Consuma una dieta saludable.  Controle los problemas de [place].  Haga ejercicios con regularidad, pero no excesivamente.  Duerma lo suficiente.  Controle el estrs.  Observe si hay cambios en el ciclo menstrual. Mantenga un registro del momento en que ocurren los perodos. Anote la fecha de inicio de los perodos, cunto duran y si hay problemas. SOLICITE ATENCIN MDICA SI: Los sntomas no mejoran con Dispensing optician. Esta informacin no tiene Marine scientist el consejo del mdico. Asegrese de hacerle al mdico cualquier pregunta que tenga. Document Released: 06/12/2013 Document Revised: 10/31/2014 Document Reviewed: 03/28/2013 Elsevier Interactive Patient Education  [employer].

## 2017-06-14 NOTE — Progress Notes (Signed)
Claire Hill 1971-10-08 [id number]   History:    46 y.o.  G0 Single.  From [place].  Works as a IT sales professional.  Visit conducted in [place] and [place].  RP:  New patient presenting for annual gyn exam   HPI: Had menses every month until 1 year ago.  No menses at all, no vaginal bleeding x 1 year.  Abstinent x age 87.  No pelvic pain.  Normal vaginal secretions.  No menopausal Sx.  No d/c from nipples.  No change in weight.  DM Type 2.  Mictions normal.  BMs wnl.  Past medical history,surgical history, family history and social history were all reviewed and documented in the EPIC chart.  Gynecologic History No LMP recorded. Patient is not currently having periods (Reason: Irregular Periods). Contraception: abstinence since age 25. Last Pap: ? Results were: normal per patient Last mammogram: 2018. Results were: normal per patient  Obstetric History OB History  Gravida Para Term Preterm AB Living  0 0 0 0 0 0  SAB TAB Ectopic Multiple Live Births  0 0 0 0 0         ROS: A ROS was performed and pertinent positives and negatives are included in the history.  GENERAL: No fevers or chills. HEENT: No change in vision, no earache, sore throat or sinus congestion. NECK: No pain or stiffness. CARDIOVASCULAR: No chest pain or pressure. No palpitations. PULMONARY: No shortness of breath, cough or wheeze. GASTROINTESTINAL: No abdominal pain, nausea, vomiting or diarrhea, melena or bright red blood per rectum. GENITOURINARY: No urinary frequency, urgency, hesitancy or dysuria. MUSCULOSKELETAL: No joint or muscle pain, no back pain, no recent trauma. DERMATOLOGIC: No rash, no itching, no lesions. ENDOCRINE: No polyuria, polydipsia, no heat or cold intolerance. No recent change in weight. HEMATOLOGICAL: No anemia or easy bruising or bleeding. NEUROLOGIC: No headache, seizures, numbness, tingling or weakness. PSYCHIATRIC: No depression, no loss of interest in normal activity or change in  sleep pattern.     Exam:   BP 132/84   Ht 5\' 6"  (1.676 m)   Wt 158 lb (71.7 kg)   BMI 25.50 kg/m   Body mass index is 25.5 kg/m.  General appearance : Well developed well nourished female. No acute distress HEENT: Eyes: no retinal hemorrhage or exudates,  Neck supple, trachea midline, no carotid bruits, no thyroidmegaly Lungs: Clear to auscultation, no rhonchi or wheezes, or rib retractions  Heart: Regular rate and rhythm, no murmurs or gallops Breast:Examined in sitting and supine position were symmetrical in appearance, no palpable masses or tenderness,  no skin retraction, no nipple inversion, no nipple discharge, no skin discoloration, no axillary or supraclavicular lymphadenopathy Abdomen: no palpable masses or tenderness, no rebound or guarding Extremities: no edema or skin discoloration or tenderness  Pelvic: Vulva normal  Bartholin, Urethra, Skene Glands: Within normal limits             Vagina: No gross lesions or discharge  Cervix: No gross lesions or discharge.  Pap/HPV done.  Uterus  AV, normal size, shape and consistency, non-tender and mobile  Adnexa  Without masses or tenderness  Anus and perineum  normal    Assessment/Plan:  46 y.o. female for annual   1. Encounter for routine gynecological examination with Papanicolaou smear of cervix Normal gyn exam.  Pap/HPV done.  Breasts wnl.  2. Encounter for other general counseling or advice on contraception Abstinence.  Will use condoms as needed.  3. Secondary amenorrhea Abstinence x age 34.  No pelvic pain.  No Menopausal Sx.  R/O Menopause/Chronic anovulation.  Labs today, f/u Pelvic US.  Management per results. - FSH - TSH - Prolactin - US Transvaginal Non-OB; Future  Counseling on above issues >50% x 20 minutes.  Princess Bruins MD, 2:17 PM 06/14/2017

## 2017-06-15 LAB — PAP, TP IMAGING W/ HPV RNA, RFLX HPV TYPE 16,18/45: HPV MRNA, HIGH RISK: NOT DETECTED

## 2017-06-15 LAB — FOLLICLE STIMULATING HORMONE: FSH: 47 m[IU]/mL

## 2017-06-15 LAB — PROLACTIN: PROLACTIN: 4.3 ng/mL

## 2017-06-28 ENCOUNTER — Other Ambulatory Visit: Payer: Self-pay | Admitting: Endocrinology

## 2017-07-05 ENCOUNTER — Ambulatory Visit: Payer: BLUE CROSS/BLUE SHIELD | Admitting: Obstetrics & Gynecology

## 2017-07-05 ENCOUNTER — Other Ambulatory Visit: Payer: BLUE CROSS/BLUE SHIELD

## 2017-07-05 DIAGNOSIS — Z0289 Encounter for other administrative examinations: Secondary | ICD-10-CM

## 2017-07-20 ENCOUNTER — Telehealth: Payer: Self-pay | Admitting: Podiatry

## 2017-07-28 ENCOUNTER — Encounter: Payer: Self-pay | Admitting: Medical

## 2017-07-28 ENCOUNTER — Ambulatory Visit (INDEPENDENT_AMBULATORY_CARE_PROVIDER_SITE_OTHER): Payer: BLUE CROSS/BLUE SHIELD | Admitting: Medical

## 2017-07-28 VITALS — BP 102/67 | HR 78 | Temp 98.5°F | Resp 16 | Ht 66.0 in | Wt 151.4 lb

## 2017-07-28 DIAGNOSIS — J029 Acute pharyngitis, unspecified: Secondary | ICD-10-CM | POA: Diagnosis not present

## 2017-07-28 DIAGNOSIS — J3089 Other allergic rhinitis: Secondary | ICD-10-CM | POA: Diagnosis not present

## 2017-07-28 LAB — POCT RAPID STREP A (OFFICE): Rapid Strep A Screen: NEGATIVE

## 2017-07-28 MED ORDER — AMOXICILLIN-POT CLAVULANATE 875-125 MG PO TABS: 1.0000 | ORAL_TABLET | Freq: Two times a day (BID) | ORAL | 0 refills | Status: DC

## 2017-07-28 MED ORDER — FAMCICLOVIR 500 MG PO TABS: 500.0000 mg | ORAL_TABLET | Freq: Three times a day (TID) | ORAL | 0 refills | Status: DC

## 2017-07-28 MED ORDER — AZELASTINE HCL 0.1 % NA SOLN: 2.0000 | Freq: Two times a day (BID) | NASAL | 3 refills | Status: DC

## 2017-07-28 MED ORDER — BENZONATATE 100 MG PO CAPS: 100.0000 mg | ORAL_CAPSULE | Freq: Three times a day (TID) | ORAL | 0 refills | Status: DC | PRN

## 2017-07-28 NOTE — Progress Notes (Signed)
Subjective:    Patient ID: Claire Hill, female    DOB: [birth year], 46 y.o.   MRN: [id number]  HPI  Pt in for some recent pain swallowing/st for 5 days. Also states some nasal congestion. At night left side sinus pressure. (nasal congestion for about 2 weeks).   Pt feels some itching to her rt side of face. Mid sharp on and off pain from rt ear to rt jaw area.(she demonstrates/shows me rt submandibular node which is recently swollen) Some coughing at night. No shortness of breath or wheezing.   No fevers, chills or sweats reported.    Review of Systems  Constitutional: Negative for chills, fatigue and fever.  HENT: Positive for congestion, postnasal drip, sinus pressure, sneezing and sore throat. Negative for sinus pain and trouble swallowing.   Respiratory: Positive for cough. Negative for chest tightness, shortness of breath and wheezing.   Cardiovascular: Negative for chest pain and palpitations.  Gastrointestinal: Negative for abdominal pain, constipation, diarrhea, rectal pain and vomiting.  Musculoskeletal: Negative for back pain.  Skin: Negative for rash.  Neurological: Positive for headaches. Negative for dizziness, tremors, seizures and weakness.       Faint mid ha.  Hematological: Positive for adenopathy. Does not bruise/bleed easily.  Psychiatric/Behavioral: Negative for behavioral problems, confusion, hallucinations and suicidal ideas. The patient is not nervous/anxious.    Past Medical History:  Diagnosis Date  . Allergy   . Anemia   . Asthma    pt states no-no inhalers  . COPD (chronic obstructive pulmonary disease) ([place])   . Diabetes mellitus   . Diabetes mellitus without complication ([place])   . Glaucoma   . Herpes    Right eye  . Herpes simplex of eye   . Hyperlipidemia   . OCD (obsessive compulsive disorder)      Social History   Social History  . Marital status: Married    Spouse name: N/A  . Number of children: N/A  . Years of education:  N/A   Occupational History  . factory production    Social History Main Topics  . Smoking status: Never Smoker  . Smokeless tobacco: Never Used  . Alcohol use No  . Drug use: No  . Sexual activity: Not Currently    Partners: Male   Other Topics Concern  . Not on file   Social History Narrative   ** Merged History Encounter **    Exercise-no       Past Surgical History:  Procedure Laterality Date  . MASS EXCISION  06/22/2012   Procedure: EXCISION MASS;  Surgeon: Harl Bowie, MD;  Location: [place];  Service: General;  Laterality: Right;  Excision chronic Right axillary cyst  . NO PAST SURGERIES      Family History  Problem Relation Age of Onset  . Diabetes Father   . Hypertension Mother   . Diabetes Paternal Grandmother   . Cancer Other        LUNG, BRAIN AND STOMACH   . Cancer Cousin        ovarian    No Known Allergies  Current Outpatient Prescriptions on File Prior to Visit  Medication Sig Dispense Refill  . aspirin (ASPIRIN EC) 81 MG EC tablet Take 1 tablet (81 mg total) by mouth daily. Swallow whole. 30 tablet 12  . atorvastatin (LIPITOR) 20 MG tablet Take 1 tablet (20 mg total) by mouth daily. 30 tablet 2  . blood glucose meter kit and supplies KIT Use  a directed twice a day.  E11.8.  Use Relion Meter 1 each 0  . brimonidine (ALPHAGAN) 0.2 % ophthalmic solution   98  . brimonidine-timolol (COMBIGAN) 0.2-0.5 % ophthalmic solution Place 1 drop into the right eye every 12 (twelve) hours. Reported on 03/16/2016    . fluticasone (FLONASE) 50 MCG/ACT nasal spray Place 2 sprays into both nostrils daily. 16 g 6  . glimepiride (AMARYL) 1 MG tablet Take 0.5 tablets (0.5 mg total) by mouth daily with breakfast. 30 tablet 4  . glimepiride (AMARYL) 1 MG tablet TAKE 1/2 TABLET BY MOUTH DAILY WITH BREAKFAST 45 tablet 0  . glucose blood (RELION GLUCOSE TEST STRIPS) test strip Use as instructed 100 each 5  . levocetirizine (XYZAL) 5 MG tablet Take 1  tablet (5 mg total) by mouth every evening. 30 tablet 5  . metFORMIN (GLUCOPHAGE-XR) 500 MG 24 hr tablet Take 2 tablets (1,000 mg total) by mouth daily. 60 tablet 5  . NONFORMULARY OR COMPOUNDED ITEM Alcohol pads  #1 box  Use as directed 1 each 5  . RELION LANCETS THIN 26G MISC Use as directed twice a day. E11.8 100 each 5  . valACYclovir (VALTREX) 1000 MG tablet Take 1,000 mg by mouth daily.     No current facility-administered medications on file prior to visit.     BP 102/67   Pulse 78   Temp 98.5 F (36.9 C) (Oral)   Resp 16   Ht '5\' 6"'$  (1.676 m)   Wt 151 lb 6.4 oz (68.7 kg)   SpO2 99%   BMI 24.44 kg/m       Objective:   Physical Exam  General  Mental Status - Alert. General Appearance - Well groomed. Not in acute distress.  Skin Rashes- No Rashes.  HEENT Head- Normal. Ear Auditory Canal - Left- Normal. Right - Normal.Tympanic Membrane- Left- Normal. Right- Normal. Eye Sclera/Conjunctiva- Left- Normal. Right- Normal. Nose & Sinuses Nasal Mucosa- Left-  Boggy and Congested. Right-  Boggy and  Congested.Bilateral maxillary and frontal sinus pressure. Mouth & Throat Lips: Upper Lip- Normal: no dryness, cracking, pallor, cyanosis, or vesicular eruption. Lower Lip-Normal: no dryness, cracking, pallor, cyanosis or vesicular eruption. Buccal Mucosa- Bilateral- No Aphthous ulcers. Oropharynx- No Discharge or Erythema. Tonsils: Characteristics- Bilateral- moderate  Erythema. Size/Enlargement- Bilateral- 1+ enlargement. Discharge- bilateral-None.  Neck Neck- Supple. No Masses. Enlarged rt submandibular node. Mild-moderate  Chest and Lung Exam Auscultation: Breath Sounds:-Clear even and unlabored.  Cardiovascular Auscultation:Rythm- Regular, rate and rhythm. Murmurs & Other Heart Sounds:Ausculatation of the heart reveal- No Murmurs.  Lymphatic Head & Neck General Head & Neck Lymphatics: Bilateral: Description- rt submandibular node swollen  Mouth- on exam. No  obvious dental caries seen.       Assessment & Plan:  Your 2 weeks of nasal congestion I think are related to allergies. I am going to prescribe Astelin nasal spray and I want you to continue the Flonase. Also use your Xyzal antihistamine.  Your recent sore throat over the past week may be related to strep throat. Your rapid strep test was negative but we are sending out throat culture. Your right sided neck lymph node I think is related to throat infection. Also have concern you might be developing sinus infection following the allergies. We'll go ahead and prescribe Augmentin antibiotic.  For cough Rx benzonatate.  You did mention a burning sensation to the right side of face. This pain seems to originate from the lymph node region. If over the weekend he noticed  a rash/small blisters to your face then start Famvir. This is an antiviral medication and I'm making is available if you were to develop rash as described. Otherwise advised not to fill.  Follow-up in 7-10 days or as needed. On follow-up I do want to recheck the lymph node after antibiotic treatment to make sure that it has gone down to normal size.

## 2017-07-28 NOTE — Patient Instructions (Addendum)
Your 2 weeks of nasal congestion I think are related to allergies. I am going to prescribe Astelin nasal spray and I want you to continue the Flonase. Also use your xyzal..  Your recent sore throat over the past week may be related to strep throat. Your rapid strep test was negative but we are sending out throat culture. Your right sided neck lymph node I think is related to throat infection. Also have concern you might be developing sinus infection following the allergies. We'll go ahead and prescribe Augmentin antibiotic.  For cough Rx benzonatate.  You did mention a burning sensation to the right side of face. This pain seems to originate from the lymph node region. If over the weekend he noticed a rash/small blisters to your face then start Famvir. This is an antiviral medication and I'm making is available if you were to develop rash as described. Otherwise advised not to fill.  Follow-up in 7-10 days or as needed. On follow-up I do want to recheck the lymph node after antibiotic treatment to make sure that it has gone down to normal size.

## 2017-07-30 LAB — CULTURE, GROUP A STREP
MICRO NUMBER:: 81110398
SPECIMEN QUALITY:: ADEQUATE

## 2017-08-01 ENCOUNTER — Ambulatory Visit (INDEPENDENT_AMBULATORY_CARE_PROVIDER_SITE_OTHER): Payer: BLUE CROSS/BLUE SHIELD | Admitting: Podiatry

## 2017-08-01 ENCOUNTER — Ambulatory Visit (HOSPITAL_BASED_OUTPATIENT_CLINIC_OR_DEPARTMENT_OTHER)
Admission: RE | Admit: 2017-08-01 | Discharge: 2017-08-01 | Disposition: A | Discharge status: Home / Self Care | Payer: BLUE CROSS/BLUE SHIELD | Source: Ambulatory Visit | Attending: Podiatry | Admitting: Podiatry

## 2017-08-01 ENCOUNTER — Encounter: Payer: Self-pay | Admitting: Podiatry

## 2017-08-01 DIAGNOSIS — M2141 Flat foot [pes planus] (acquired), right foot: Secondary | ICD-10-CM | POA: Diagnosis not present

## 2017-08-01 DIAGNOSIS — D3610 Benign neoplasm of peripheral nerves and autonomic nervous system, unspecified: Secondary | ICD-10-CM | POA: Diagnosis not present

## 2017-08-01 DIAGNOSIS — M7752 Other enthesopathy of left foot: Secondary | ICD-10-CM | POA: Diagnosis not present

## 2017-08-01 DIAGNOSIS — M779 Enthesopathy, unspecified: Secondary | ICD-10-CM | POA: Diagnosis present

## 2017-08-01 DIAGNOSIS — M2142 Flat foot [pes planus] (acquired), left foot: Secondary | ICD-10-CM | POA: Diagnosis not present

## 2017-08-01 MED ORDER — MELOXICAM 15 MG PO TABS: 15.0000 mg | ORAL_TABLET | Freq: Every day | ORAL | 0 refills | Status: DC

## 2017-08-01 NOTE — Progress Notes (Signed)
Subjective: Claire Hill presents the office today for follow-up evaluation of left foot pain. She states the orthotics have been helping but the offloading pad does not help. She states her pain is about the same but the orthotics are helping. She previously saw another physician, sports medicine for an injection for neuroma the left foot. Again she states the injection helped but the pain continues to that area as well. This been ongoing for about 5 months. Just a point the offset aspect of her foot on the sinus tarsi where she gets discomfort. She denies any recent injury or trauma. She denies any swelling or redness. She has no sharp shooting pains in the toes. Denies any systemic complaints such as fevers, chills, nausea, vomiting. No acute changes since last appointment, and no other complaints at this time.   Objective: AAO x3, NAD DP/PT pulses palpable bilaterally, CRT less than 3 seconds There is a mild decrease in medial arch height upon weightbearing. The majority tenderness today appears to be localized the lateral aspect of the left foot on the sinus tarsi. There is no pain with subtalar joint range of motion. There is no pain with ankle joint. There is minimal discomfort in the second interspace and the left foot there is no palpable neuroma identified today. There is no area pinpoint bony tenderness or pain the vibratory sensation otherwise. No open lesions or pre-ulcerative lesions.  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  -Today a steroid injection was performed with kenalog 5 and local anesthetic into the lateral aspect of the left foot on the sinus tarsi. This was completed today without any complications. Post injection care was discussed. -Prescribed meloxicam  discussed side effects -Continue inserts -She having a "sleepy" sensation to her big toe but seems localized. We'll continue to monitor. Consider nerve conduction test that symptoms continue. -X-ray ordered  -Patient encouraged to call the office with any questions, concerns, change in symptoms.   Claire Hill, DPM

## 2017-08-02 ENCOUNTER — Telehealth: Payer: Self-pay | Admitting: Podiatry

## 2017-08-02 NOTE — Telephone Encounter (Signed)
I informed pt of Dr. Leigh Aurora review of x-ray results.

## 2017-08-02 NOTE — Telephone Encounter (Signed)
Someone just called me from this office. I told the pt it was the nurse and that she was on the phone with another pt right now but I would give her a message to call her back at [phone].

## 2017-08-02 NOTE — Telephone Encounter (Addendum)
-----   Message from Trula Slade, DPM sent at 08/02/2017  7:08 AM EDT ----- Please let her know there is no evidence of fracture and the joints look to be good at this point. Thanks. Left message informing x-ray results were available to call back. Pt called back and I informed of Dr. Leigh Aurora review of results.

## 2017-08-05 ENCOUNTER — Other Ambulatory Visit: Payer: Self-pay | Admitting: Family Medicine

## 2017-08-08 ENCOUNTER — Other Ambulatory Visit: Payer: Self-pay | Admitting: Medical

## 2017-08-09 ENCOUNTER — Telehealth: Payer: Self-pay | Admitting: Medical

## 2017-08-09 MED ORDER — BENZONATATE 100 MG PO CAPS: 100.0000 mg | ORAL_CAPSULE | Freq: Three times a day (TID) | ORAL | 0 refills | Status: DC | PRN

## 2017-08-09 NOTE — Telephone Encounter (Signed)
I refilled pt benzonatate but I think I sent it to the wrong pharmacy. Will you cancel rx I sent and send to [facility]. Sorry.

## 2017-08-09 NOTE — Telephone Encounter (Signed)
Pt requesting refill for Benzonatate. Please advise.

## 2017-08-09 NOTE — Telephone Encounter (Signed)
Refilled benzonatate

## 2017-08-11 ENCOUNTER — Other Ambulatory Visit: Payer: Self-pay | Admitting: Medical

## 2017-08-14 MED ORDER — BENZONATATE 100 MG PO CAPS: 100.0000 mg | ORAL_CAPSULE | Freq: Three times a day (TID) | ORAL | 0 refills | Status: DC | PRN

## 2017-08-14 NOTE — Addendum Note (Signed)
Addended by: Hinton Dyer on: 08/14/2017 01:56 PM   Modules accepted: Orders

## 2017-08-14 NOTE — Telephone Encounter (Signed)
done

## 2017-09-04 ENCOUNTER — Other Ambulatory Visit: Payer: Self-pay | Admitting: Podiatry

## 2017-09-27 ENCOUNTER — Other Ambulatory Visit: Payer: Self-pay | Admitting: Podiatry

## 2017-09-30 ENCOUNTER — Other Ambulatory Visit: Payer: Self-pay | Admitting: Family Medicine

## 2017-09-30 DIAGNOSIS — R1011 Right upper quadrant pain: Secondary | ICD-10-CM

## 2017-10-04 ENCOUNTER — Other Ambulatory Visit: Payer: Self-pay | Admitting: Family Medicine

## 2017-10-04 ENCOUNTER — Other Ambulatory Visit: Payer: Self-pay | Admitting: Endocrinology

## 2017-10-04 DIAGNOSIS — R1011 Right upper quadrant pain: Secondary | ICD-10-CM

## 2017-10-04 NOTE — Telephone Encounter (Signed)
Please refill x 1 Ov is due  

## 2017-10-15 ENCOUNTER — Other Ambulatory Visit: Payer: Self-pay | Admitting: Medical

## 2017-10-18 ENCOUNTER — Other Ambulatory Visit: Payer: Self-pay | Admitting: Family Medicine

## 2017-10-23 ENCOUNTER — Ambulatory Visit (INDEPENDENT_AMBULATORY_CARE_PROVIDER_SITE_OTHER): Payer: BLUE CROSS/BLUE SHIELD | Admitting: Medical

## 2017-10-23 ENCOUNTER — Encounter: Payer: Self-pay | Admitting: Medical

## 2017-10-23 VITALS — BP 109/68 | HR 82 | Temp 98.1°F | Resp 16 | Ht 66.0 in | Wt 158.2 lb

## 2017-10-23 DIAGNOSIS — J029 Acute pharyngitis, unspecified: Secondary | ICD-10-CM

## 2017-10-23 DIAGNOSIS — J301 Allergic rhinitis due to pollen: Secondary | ICD-10-CM

## 2017-10-23 DIAGNOSIS — J3489 Other specified disorders of nose and nasal sinuses: Secondary | ICD-10-CM | POA: Diagnosis not present

## 2017-10-23 MED ORDER — LEVOCETIRIZINE DIHYDROCHLORIDE 5 MG PO TABS: 5.0000 mg | ORAL_TABLET | Freq: Every evening | ORAL | 3 refills | Status: DC

## 2017-10-23 MED ORDER — AZITHROMYCIN 250 MG PO TABS
ORAL_TABLET | ORAL | 0 refills | Status: DC
Start: 2017-10-23 — End: 2017-12-07

## 2017-10-23 MED ORDER — AZELASTINE HCL 0.1 % NA SOLN: 2.0000 | Freq: Two times a day (BID) | NASAL | 11 refills | Status: DC

## 2017-10-23 MED ORDER — FLUTICASONE PROPIONATE 50 MCG/ACT NA SUSP: 2.0000 | Freq: Every day | NASAL | 11 refills | Status: DC

## 2017-10-23 NOTE — Patient Instructions (Addendum)
You do appear to have some chronic allergy type symptoms.  With recent flare/worsening of chronic symptoms.  I am refilling your Xyzal antihistamine, Flonase nasal spray and Astelin nasal spray.  You have very mild sinus pressure recently and your throat does not look suspicious for strep presently.  If your sinus pain persist/worsens or your throat feels worse than he can start a azithromycin antibiotic which I made available as a printed prescription.  Follow-up in 7 days or as needed.

## 2017-10-23 NOTE — Progress Notes (Signed)
Subjective:    Patient ID: Claire Hill, female    DOB: 07-16-1971, 46 y.o.   MRN: [id number]  HPI  Pt in states recent eyes itching, ear itching and pnd. Pt in past has allergy history. She was on astelin, flonase and xyzal and it did help. In October had some allergy symptoms and she responded well to treatment. But when she stopped  medication her symptoms returned and got worse recently since early [place].(Describes year round allergy type symptoms)  Some mild sinus pressure presently. Faint sore throat. But no fevers, no chills or myalgia.  Left side maxillary sinus pressure is where feels occasional discomforet.   Review of Systems  Constitutional: Negative for chills, fatigue and fever.  HENT: Positive for congestion and postnasal drip. Negative for ear pain, facial swelling, hearing loss, mouth sores, nosebleeds, rhinorrhea, sinus pressure, sinus pain and sore throat.        Faint sinus pressure on and off.  Eyes: Positive for itching. Negative for photophobia, discharge, redness and visual disturbance.  Respiratory: Negative for cough, chest tightness, shortness of breath and wheezing.   Cardiovascular: Negative for chest pain and palpitations.  Gastrointestinal: Negative for abdominal pain, blood in stool, constipation and nausea.  Musculoskeletal: Negative for back pain and joint swelling.  Skin: Negative for rash.  Neurological: Negative for dizziness, seizures, weakness and light-headedness.  Hematological: Negative for adenopathy. Does not bruise/bleed easily.  Psychiatric/Behavioral: Negative for behavioral problems, confusion and sleep disturbance. The patient is not nervous/anxious.    Past Medical History:  Diagnosis Date  . Allergy   . Anemia   . Asthma    pt states no-no inhalers  . COPD (chronic obstructive pulmonary disease) ([place])   . Diabetes mellitus   . Diabetes mellitus without complication ([place])   . Glaucoma   . Herpes    Right eye  . Herpes  simplex of eye   . Hyperlipidemia   . OCD (obsessive compulsive disorder)      Social History   Socioeconomic History  . Marital status: Married    Spouse name: Not on file  . Number of children: Not on file  . Years of education: Not on file  . Highest education level: Not on file  Social Needs  . Financial resource strain: Not on file  . Food insecurity - worry: Not on file  . Food insecurity - inability: Not on file  . Transportation needs - medical: Not on file  . Transportation needs - non-medical: Not on file  Occupational History  . Occupation: factory Surveyor, quantity  Tobacco Use  . Smoking status: Never Smoker  . Smokeless tobacco: Never Used  Substance and Sexual Activity  . Alcohol use: No  . Drug use: No  . Sexual activity: Not Currently    Partners: Male  Other Topics Concern  . Not on file  Social History Narrative   ** Merged History Encounter **    Exercise-no    Past Surgical History:  Procedure Laterality Date  . MASS EXCISION  06/22/2012   Procedure: EXCISION MASS;  Surgeon: Harl Bowie, MD;  Location: [facility];  Service: General;  Laterality: Right;  Excision chronic Right axillary cyst  . NO PAST SURGERIES      Family History  Problem Relation Age of Onset  . Diabetes Father   . Hypertension Mother   . Diabetes Paternal Grandmother   . Cancer Other        LUNG, BRAIN AND STOMACH   .  Cancer Cousin        ovarian    No Known Allergies  Current Outpatient Medications on File Prior to Visit  Medication Sig Dispense Refill  . amoxicillin-clavulanate (AUGMENTIN) 875-125 MG tablet Take 1 tablet by mouth 2 (two) times daily. 20 tablet 0  . aspirin (ASPIRIN EC) 81 MG EC tablet Take 1 tablet (81 mg total) by mouth daily. Swallow whole. 30 tablet 12  . atorvastatin (LIPITOR) 20 MG tablet Take 1 tablet (20 mg total) by mouth daily. 30 tablet 2  . benzonatate (TESSALON) 100 MG capsule Take 1 capsule (100 mg total) by mouth 3  (three) times daily as needed for cough. 21 capsule 0  . blood glucose meter kit and supplies KIT Use a directed twice a day.  E11.8.  Use Relion Meter 1 each 0  . brimonidine (ALPHAGAN) 0.2 % ophthalmic solution   98  . brimonidine-timolol (COMBIGAN) 0.2-0.5 % ophthalmic solution Place 1 drop into the right eye every 12 (twelve) hours. Reported on 03/16/2016    . famciclovir (FAMVIR) 500 MG tablet Take 1 tablet (500 mg total) by mouth 3 (three) times daily. 21 tablet 0  . glimepiride (AMARYL) 1 MG tablet Take 0.5 tablets (0.5 mg total) by mouth daily with breakfast. 30 tablet 4  . glimepiride (AMARYL) 1 MG tablet TAKE 1/2 TABLET BY MOUTH DAILY WITH BREAKFAST 45 tablet 1  . glucose blood (RELION GLUCOSE TEST STRIPS) test strip Use as instructed 100 each 5  . meloxicam (MOBIC) 15 MG tablet Take 1 tablet (15 mg total) by mouth daily. 14 tablet 0  . metFORMIN (GLUCOPHAGE-XR) 500 MG 24 hr tablet Take 2 tablets (1,000 mg total) by mouth daily. 180 tablet 1  . NONFORMULARY OR COMPOUNDED ITEM Alcohol pads  #1 box  Use as directed 1 each 5  . omeprazole (PRILOSEC) 40 MG capsule TAKE ONE CAPSULE BY MOUTH DAILY 30 capsule 4  . omeprazole (PRILOSEC) 40 MG capsule TAKE ONE CAPSULE BY MOUTH DAILY 30 capsule 4  . RELION LANCETS THIN 26G MISC Use as directed twice a day. E11.8 100 each 5  . valACYclovir (VALTREX) 1000 MG tablet Take 1,000 mg by mouth daily.     No current facility-administered medications on file prior to visit.     BP 109/68   Pulse 82   Temp 98.1 F (36.7 C) (Oral)   Resp 16   Ht '5\' 6"'$  (1.676 m)   Wt 158 lb 3.2 oz (71.8 kg)   SpO2 100%   BMI 25.53 kg/m       Objective:   Physical Exam  General  Mental Status - Alert. General Appearance - Well groomed. Not in acute distress.  Skin Rashes- No Rashes.  HEENT Head- Normal. Ear Auditory Canal - Left- Normal. Right - Normal.Tympanic Membrane- Left- Normal. Right- Normal. Eye Sclera/Conjunctiva- Left- Normal. Right-  Normal. Nose & Sinuses Nasal Mucosa- Left-  Boggy and Congested. Right-  Boggy and  Congested.Bilateral  Faint rt side maxillary tender  But no frontal sinus pressure. Mouth & Throat Lips: Upper Lip- Normal: no dryness, cracking, pallor, cyanosis, or vesicular eruption. Lower Lip-Normal: no dryness, cracking, pallor, cyanosis or vesicular eruption. Buccal Mucosa- Bilateral- No Aphthous ulcers. Oropharynx- No Discharge or Erythema.Moderate pnd. Tonsils: Characteristics- Bilateral- faint Erythema or Congestion. Size/Enlargement- Bilateral- No enlargement. Discharge- bilateral-None.    Neck Neck- Supple. No Masses.   Chest and Lung Exam Auscultation: Breath Sounds:-Clear even and unlabored.  Cardiovascular Auscultation:Rythm- Regular, rate and rhythm. Murmurs & Other Heart  Sounds:Ausculatation of the heart reveal- No Murmurs.  Lymphatic Head & Neck General Head & Neck Lymphatics: Bilateral: Description- No Localized lymphadenopathy.       Assessment & Plan:  You do appear to have some chronic allergy type symptoms.  With recent flare/worsening of chronic symptoms.  I am refilling your Xyzal antihistamine, Flonase nasal spray and Astelin nasal spray.  You have very mild sinus pressure recently and your throat does not look suspicious for strep presently.  If your sinus pain persist/worsens or your throat feels worse than he can start a azithromycin antibiotic which I made available as a printed prescription.  Follow-up in 7 days or as needed.  Sueko Dimichele, Percell Miller, PA-C

## 2017-10-30 ENCOUNTER — Other Ambulatory Visit (INDEPENDENT_AMBULATORY_CARE_PROVIDER_SITE_OTHER): Payer: BLUE CROSS/BLUE SHIELD

## 2017-10-30 ENCOUNTER — Other Ambulatory Visit: Payer: Self-pay | Admitting: Medical

## 2017-10-30 DIAGNOSIS — E119 Type 2 diabetes mellitus without complications: Secondary | ICD-10-CM

## 2017-10-30 DIAGNOSIS — E785 Hyperlipidemia, unspecified: Secondary | ICD-10-CM | POA: Diagnosis not present

## 2017-10-30 LAB — LIPID PANEL
CHOLESTEROL: 216 mg/dL — AB (ref 0–200)
HDL: 89.3 mg/dL (ref >39.00)
LDL Cholesterol: 105 mg/dL — ABNORMAL HIGH (ref 0–99)
NonHDL: 126.36
Total CHOL/HDL Ratio: 2
Triglycerides: 105 mg/dL (ref 0.0–149.0)
VLDL: 21 mg/dL (ref 0.0–40.0)

## 2017-10-30 LAB — COMPREHENSIVE METABOLIC PANEL
ALBUMIN: 4.3 g/dL (ref 3.5–5.2)
ALK PHOS: 116 U/L (ref 39–117)
ALT: 62 U/L — AB (ref 0–35)
AST: 30 U/L (ref 0–37)
BILIRUBIN TOTAL: 0.6 mg/dL (ref 0.2–1.2)
BUN: 15 mg/dL (ref 6–23)
CO2: 33 mEq/L — ABNORMAL HIGH (ref 19–32)
Calcium: 9.7 mg/dL (ref 8.4–10.5)
Chloride: 103 mEq/L (ref 96–112)
Creatinine, Ser: 0.63 mg/dL (ref 0.40–1.20)
GFR: 107.91 mL/min (ref >60.00)
Glucose, Bld: 127 mg/dL — ABNORMAL HIGH (ref 70–99)
POTASSIUM: 4.4 meq/L (ref 3.5–5.1)
Sodium: 142 mEq/L (ref 135–145)
TOTAL PROTEIN: 6.7 g/dL (ref 6.0–8.3)

## 2017-10-30 LAB — HEMOGLOBIN A1C: HEMOGLOBIN A1C: 6.7 % — AB (ref 4.6–6.5)

## 2017-10-31 NOTE — Telephone Encounter (Signed)
If pt feels better does not further antibiotics. Please notify her.

## 2017-10-31 NOTE — Telephone Encounter (Signed)
Patient finished her Azithromycin and she requested another one because she was under the impression she needed to stay on this longer. She is doing better.

## 2017-11-01 ENCOUNTER — Other Ambulatory Visit: Payer: Self-pay

## 2017-11-01 MED ORDER — ATORVASTATIN CALCIUM 80 MG PO TABS: 80.0000 mg | ORAL_TABLET | Freq: Every day | ORAL | 3 refills | Status: DC

## 2017-11-01 NOTE — Telephone Encounter (Signed)
Patient was told yesterday no further antibiotics needed.

## 2017-11-13 ENCOUNTER — Encounter: Payer: Self-pay | Admitting: Family Medicine

## 2017-11-13 ENCOUNTER — Ambulatory Visit: Payer: BLUE CROSS/BLUE SHIELD | Admitting: Family Medicine

## 2017-11-13 VITALS — BP 115/62 | HR 85 | Temp 98.1°F | Resp 18 | Ht 66.14 in | Wt 155.8 lb

## 2017-11-13 DIAGNOSIS — E785 Hyperlipidemia, unspecified: Secondary | ICD-10-CM | POA: Diagnosis not present

## 2017-11-13 DIAGNOSIS — Z889 Allergy status to unspecified drugs, medicaments and biological substances status: Secondary | ICD-10-CM | POA: Diagnosis not present

## 2017-11-13 DIAGNOSIS — M25561 Pain in right knee: Secondary | ICD-10-CM

## 2017-11-13 DIAGNOSIS — E1165 Type 2 diabetes mellitus with hyperglycemia: Secondary | ICD-10-CM | POA: Diagnosis not present

## 2017-11-13 DIAGNOSIS — E1151 Type 2 diabetes mellitus with diabetic peripheral angiopathy without gangrene: Secondary | ICD-10-CM

## 2017-11-13 DIAGNOSIS — M542 Cervicalgia: Secondary | ICD-10-CM

## 2017-11-13 DIAGNOSIS — G8929 Other chronic pain: Secondary | ICD-10-CM | POA: Diagnosis not present

## 2017-11-13 DIAGNOSIS — IMO0002 Reserved for concepts with insufficient information to code with codable children: Secondary | ICD-10-CM | POA: Insufficient documentation

## 2017-11-13 LAB — TSH: TSH: 1.35 u[IU]/mL (ref 0.35–4.50)

## 2017-11-13 LAB — LIPID PANEL
CHOL/HDL RATIO: 3
CHOLESTEROL: 223 mg/dL — AB (ref 0–200)
HDL: 88.6 mg/dL (ref >39.00)
LDL CALC: 116 mg/dL — AB (ref 0–99)
NonHDL: 134.79
TRIGLYCERIDES: 92 mg/dL (ref 0.0–149.0)
VLDL: 18.4 mg/dL (ref 0.0–40.0)

## 2017-11-13 LAB — COMPREHENSIVE METABOLIC PANEL
ALBUMIN: 4.5 g/dL (ref 3.5–5.2)
ALK PHOS: 79 U/L (ref 39–117)
ALT: 21 U/L (ref 0–35)
AST: 25 U/L (ref 0–37)
BUN: 14 mg/dL (ref 6–23)
CHLORIDE: 104 meq/L (ref 96–112)
CO2: 31 mEq/L (ref 19–32)
Calcium: 9.8 mg/dL (ref 8.4–10.5)
Creatinine, Ser: 0.68 mg/dL (ref 0.40–1.20)
GFR: 98.79 mL/min (ref >60.00)
Glucose, Bld: 122 mg/dL — ABNORMAL HIGH (ref 70–99)
POTASSIUM: 4.2 meq/L (ref 3.5–5.1)
Sodium: 142 mEq/L (ref 135–145)
TOTAL PROTEIN: 6.9 g/dL (ref 6.0–8.3)
Total Bilirubin: 0.6 mg/dL (ref 0.2–1.2)

## 2017-11-13 LAB — HEMOGLOBIN A1C: Hgb A1c MFr Bld: 6.7 % — ABNORMAL HIGH (ref 4.6–6.5)

## 2017-11-13 MED ORDER — MONTELUKAST SODIUM 10 MG PO TABS: 10.0000 mg | ORAL_TABLET | Freq: Every day | ORAL | 3 refills | Status: DC

## 2017-11-13 MED ORDER — BLOOD GLUCOSE MONITOR KIT: PACK | 0 refills | Status: AC

## 2017-11-13 NOTE — Assessment & Plan Note (Signed)
Tolerating statin, encouraged heart healthy diet, avoid trans fats, minimize simple carbs and saturated fats. Increase exercise as tolerated 

## 2017-11-13 NOTE — Assessment & Plan Note (Signed)
con't astelin and flonase and xyzal Add singulair rto prn

## 2017-11-13 NOTE — Assessment & Plan Note (Signed)
rx for machine at [place] --- should be cheaper so she can afford the strips

## 2017-11-13 NOTE — Progress Notes (Signed)
Patient ID: Claire Hill, female    DOB: Mar 07, 1971  Age: 47 y.o. MRN: [id number]    Subjective:  Subjective  HPI Claire Hill presents for f/u dm and cholesterol  She c/o pain in her R knee esp with going up and down stairs-- it pops. No known injury.  She stands all day at work --- pol[employer].  HPI   DIABETES    Blood Sugar ranges-not checking--- needs cheaper strips  Polyuria- no New Visual problems- no  Hypoglycemic symptoms- no  Other side effects-good Medication compliance - good  Last eye exam- due    HYPERLIPIDEMIA  Medication compliance- good RUQ pain- no  Muscle aches- no Other side effects-no     Review of Systems  Constitutional: Negative for activity change, appetite change, diaphoresis, fatigue and unexpected weight change.  HENT: Positive for postnasal drip and sneezing. Negative for congestion, sinus pressure, sinus pain and sore throat.   Eyes: Negative for pain, redness and visual disturbance.  Respiratory: Negative for cough, chest tightness, shortness of breath and wheezing.   Cardiovascular: Negative for chest pain, palpitations and leg swelling.  Endocrine: Negative for cold intolerance, heat intolerance, polydipsia, polyphagia and polyuria.  Genitourinary: Negative for difficulty urinating, dysuria and frequency.  Neurological: Negative for dizziness, light-headedness, numbness and headaches.  Psychiatric/Behavioral: Negative for behavioral problems and dysphoric mood. The patient is not nervous/anxious.     History Past Medical History:  Diagnosis Date  . Allergy   . Anemia   . Asthma    pt states no-no inhalers  . COPD (chronic obstructive pulmonary disease) ([place])   . Diabetes mellitus   . Diabetes mellitus without complication ([place])   . Glaucoma   . Herpes    Right eye  . Herpes simplex of eye   . Hyperlipidemia   . OCD (obsessive compulsive disorder)     She has a past surgical history that includes No past surgeries and  Mass excision (06/22/2012).   Her family history includes Cancer in her cousin and other; Diabetes in her father and paternal grandmother; Hypertension in her mother.She reports that  has never smoked. she has never used smokeless tobacco. She reports that she does not drink alcohol or use drugs.  Current Outpatient Medications on File Prior to Visit  Medication Sig Dispense Refill  . amoxicillin-clavulanate (AUGMENTIN) 875-125 MG tablet Take 1 tablet by mouth 2 (two) times daily. 20 tablet 0  . aspirin (ASPIRIN EC) 81 MG EC tablet Take 1 tablet (81 mg total) by mouth daily. Swallow whole. 30 tablet 12  . atorvastatin (LIPITOR) 80 MG tablet Take 1 tablet (80 mg total) by mouth daily. 90 tablet 3  . azelastine (ASTELIN) 0.1 % nasal spray Place 2 sprays into both nostrils 2 (two) times daily. Use in each nostril as directed 30 mL 11  . azithromycin (ZITHROMAX) 250 MG tablet Take 2 tablets by mouth on day 1, followed by 1 tablet by mouth daily for 4 days. 6 tablet 0  . benzonatate (TESSALON) 100 MG capsule Take 1 capsule (100 mg total) by mouth 3 (three) times daily as needed for cough. 21 capsule 0  . blood glucose meter kit and supplies KIT Use a directed twice a day.  E11.8.  Use Relion Meter 1 each 0  . brimonidine (ALPHAGAN) 0.2 % ophthalmic solution   98  . brimonidine-timolol (COMBIGAN) 0.2-0.5 % ophthalmic solution Place 1 drop into the right eye every 12 (twelve) hours. Reported on 03/16/2016    . famciclovir (  FAMVIR) 500 MG tablet Take 1 tablet (500 mg total) by mouth 3 (three) times daily. 21 tablet 0  . fluticasone (FLONASE) 50 MCG/ACT nasal spray Place 2 sprays into both nostrils daily. 16 g 11  . glimepiride (AMARYL) 1 MG tablet Take 0.5 tablets (0.5 mg total) by mouth daily with breakfast. 30 tablet 4  . glimepiride (AMARYL) 1 MG tablet TAKE 1/2 TABLET BY MOUTH DAILY WITH BREAKFAST 45 tablet 1  . glucose blood (RELION GLUCOSE TEST STRIPS) test strip Use as instructed 100 each 5  .  levocetirizine (XYZAL) 5 MG tablet Take 1 tablet (5 mg total) by mouth every evening. 90 tablet 3  . meloxicam (MOBIC) 15 MG tablet Take 1 tablet (15 mg total) by mouth daily. 14 tablet 0  . metFORMIN (GLUCOPHAGE-XR) 500 MG 24 hr tablet Take 2 tablets (1,000 mg total) by mouth daily. 180 tablet 1  . NONFORMULARY OR COMPOUNDED ITEM Alcohol pads  #1 box  Use as directed 1 each 5  . omeprazole (PRILOSEC) 40 MG capsule TAKE ONE CAPSULE BY MOUTH DAILY 30 capsule 4  . omeprazole (PRILOSEC) 40 MG capsule TAKE ONE CAPSULE BY MOUTH DAILY 30 capsule 4  . RELION LANCETS THIN 26G MISC Use as directed twice a day. E11.8 100 each 5  . valACYclovir (VALTREX) 1000 MG tablet Take 1,000 mg by mouth daily.     No current facility-administered medications on file prior to visit.      Objective:  Objective  Physical Exam  Constitutional: She is oriented to person, place, and time. She appears well-developed and well-nourished.  HENT:  Head: Normocephalic and atraumatic.  Eyes: Conjunctivae and EOM are normal.  Neck: Normal range of motion. Neck supple. Carotid bruit is not present. No tracheal deviation present. No thyromegaly present.  Cardiovascular: Normal rate, regular rhythm and normal heart sounds.  No murmur heard. Pulmonary/Chest: Effort normal and breath sounds normal. No stridor. No respiratory distress. She has no wheezes. She has no rales. She exhibits no tenderness.  Musculoskeletal: She exhibits no edema.  Lymphadenopathy:    She has no cervical adenopathy.  Neurological: She is alert and oriented to person, place, and time.  Psychiatric: She has a normal mood and affect.  Nursing note and vitals reviewed.  BP 115/62 (BP Location: Left Arm, Patient Position: Sitting, Cuff Size: Normal)   Pulse 85   Temp 98.1 F (36.7 C) (Oral)   Resp 18   Ht 5' 6.14" (1.68 m)   Wt 155 lb 12.8 oz (70.7 kg)   SpO2 98%   BMI 25.04 kg/m  Wt Readings from Last 3 Encounters:  11/13/17 155 lb 12.8 oz (70.7  kg)  10/23/17 158 lb 3.2 oz (71.8 kg)  07/28/17 151 lb 6.4 oz (68.7 kg)     Lab Results  Component Value Date   WBC 3.4 (L) 05/15/2017   HGB 13.1 05/15/2017   HCT 40.6 05/15/2017   PLT 262.0 05/15/2017   GLUCOSE 127 (H) 10/30/2017   CHOL 216 (H) 10/30/2017   TRIG 105.0 10/30/2017   HDL 89.30 10/30/2017   LDLDIRECT 173.1 03/30/2012   LDLCALC 105 (H) 10/30/2017   ALT 62 (H) 10/30/2017   AST 30 10/30/2017   NA 142 10/30/2017   K 4.4 10/30/2017   CL 103 10/30/2017   CREATININE 0.63 10/30/2017   BUN 15 10/30/2017   CO2 33 (H) 10/30/2017   TSH 1.22 06/14/2017   HGBA1C 6.7 (H) 10/30/2017   MICROALBUR 0.3 11/14/2016    Dg Foot  Complete Left  Result Date: 08/01/2017 CLINICAL DATA:  Left great toe pain for 2 months.  No known injury. EXAM: LEFT FOOT - COMPLETE 3+ VIEW COMPARISON:  None. FINDINGS: There is no evidence of fracture or dislocation. There is no evidence of arthropathy or other focal bone abnormality. Soft tissues are unremarkable. IMPRESSION: Negative exam. Electronically Signed   By: Inge Rise M.D.   On: 08/01/2017 10:52     Assessment & Plan:  Plan  I am having Claire V Mcgloin start on blood glucose meter kit and supplies and montelukast. I am also having her maintain her valACYclovir, aspirin, brimonidine-timolol, brimonidine, blood glucose meter kit and supplies, RELION LANCETS THIN 26G, glucose blood, glimepiride, NONFORMULARY OR COMPOUNDED ITEM, amoxicillin-clavulanate, famciclovir, meloxicam, benzonatate, omeprazole, glimepiride, omeprazole, metFORMIN, azelastine, fluticasone, levocetirizine, azithromycin, and atorvastatin.  Meds ordered this encounter  Medications  . blood glucose meter kit and supplies KIT    Sig: Dispense based on patient and insurance preference. Use up to four times daily as directed. (FOR ICD-9 250.00, 250.01).    Dispense:  1 each    Refill:  0    Order Specific Question:   Number of strips    Answer:   100    Order Specific  Question:   Number of lancets    Answer:   100  . montelukast (SINGULAIR) 10 MG tablet    Sig: Take 1 tablet (10 mg total) by mouth at bedtime.    Dispense:  30 tablet    Refill:  3    Problem List Items Addressed This Visit      Unprioritized   Chronic pain of right knee   DM (diabetes mellitus) type II uncontrolled, periph vascular disorder ([place]) - Primary    rx for machine at [place] --- should be cheaper so she can afford the strips      Relevant Medications   blood glucose meter kit and supplies KIT   Other Relevant Orders   Lipid panel   Hemoglobin A1c   Comprehensive metabolic panel   H/O seasonal allergies    con't astelin and flonase and xyzal Add singulair rto prn      Relevant Medications   montelukast (SINGULAIR) 10 MG tablet   Hyperlipidemia LDL goal <70    Tolerating statin, encouraged heart healthy diet, avoid trans fats, minimize simple carbs and saturated fats. Increase exercise as tolerated      Relevant Orders   Lipid panel   Hemoglobin A1c   Comprehensive metabolic panel    Other Visit Diagnoses    Neck pain on right side       Relevant Orders   TSH   Ambulatory referral to Sports Medicine      Follow-up: Return in about 6 months (around 05/13/2018) for hyperlipidemia, diabetes II, annual exam, fasting.  Ann Held, DO

## 2017-11-13 NOTE — Patient Instructions (Signed)

## 2017-11-14 ENCOUNTER — Other Ambulatory Visit: Payer: Self-pay

## 2017-11-14 MED ORDER — EZETIMIBE 10 MG PO TABS: 10.0000 mg | ORAL_TABLET | Freq: Every day | ORAL | 3 refills | Status: DC

## 2017-11-14 NOTE — Addendum Note (Signed)
Addended by: Jiles Prows on: 11/14/2017 09:08 AM   Modules accepted: Orders

## 2017-12-07 ENCOUNTER — Encounter: Payer: Self-pay | Admitting: Family Medicine

## 2017-12-07 ENCOUNTER — Ambulatory Visit: Payer: BLUE CROSS/BLUE SHIELD | Admitting: Family Medicine

## 2017-12-07 VITALS — BP 122/78 | HR 82 | Temp 98.2°F | Ht 66.14 in | Wt 157.0 lb

## 2017-12-07 DIAGNOSIS — M542 Cervicalgia: Secondary | ICD-10-CM

## 2017-12-07 MED ORDER — MELOXICAM 15 MG PO TABS: 15.0000 mg | ORAL_TABLET | Freq: Every day | ORAL | 0 refills | Status: DC

## 2017-12-07 NOTE — Progress Notes (Signed)
Claire Hill - 47 y.o. female MRN [id number]  Date of birth: [birth year]  SUBJECTIVE:  Including CC & ROS.  Chief Complaint  Patient presents with  . Neck Pain    Claire Hill is a 47 y.o. female that is presenting with neck pain. Pain has been ongoing for two weeks. Pain is located near her occipital region and extends down to her neck. Pain is described as a tenderness. Denies injury or surgeries. She has not been taking anything for the pain.  She has been applying heat. Is occurring on the right side. She denies any radicular symptoms. She denies any inciting event. She has not tried any medications. Pain is mild to moderate nature. Pain is worse with certain movements.   Review of Systems  Constitutional: Negative for fever.  Respiratory: Negative for cough.   Cardiovascular: Negative for chest pain.  Gastrointestinal: Negative for abdominal pain.  Musculoskeletal: Positive for neck pain.  Skin: Negative for color change.  Allergic/Immunologic: Negative for immunocompromised state.  Neurological: Negative for weakness.  Hematological: Negative for adenopathy.  Psychiatric/Behavioral: Negative for agitation.    HISTORY: Past Medical, Surgical, Social, and Family History Reviewed & Updated per EMR.   Pertinent Historical Findings include:  Past Medical History:  Diagnosis Date  . Allergy   . Anemia   . Asthma    pt states no-no inhalers  . COPD (chronic obstructive pulmonary disease) ([place])   . Diabetes mellitus   . Diabetes mellitus without complication ([place])   . Glaucoma   . Herpes    Right eye  . Herpes simplex of eye   . Hyperlipidemia   . OCD (obsessive compulsive disorder)     Past Surgical History:  Procedure Laterality Date  . MASS EXCISION  06/22/2012   Procedure: EXCISION MASS;  Surgeon: Harl Bowie, MD;  Location: [facility];  Service: General;  Laterality: Right;  Excision chronic Right axillary cyst  . NO PAST  SURGERIES      No Known Allergies  Family History  Problem Relation Age of Onset  . Diabetes Father   . Hypertension Mother   . Diabetes Paternal Grandmother   . Cancer Other        LUNG, BRAIN AND STOMACH   . Cancer Cousin        ovarian     Social History   Socioeconomic History  . Marital status: Married    Spouse name: Not on file  . Number of children: Not on file  . Years of education: Not on file  . Highest education level: Not on file  Social Needs  . Financial resource strain: Not on file  . Food insecurity - worry: Not on file  . Food insecurity - inability: Not on file  . Transportation needs - medical: Not on file  . Transportation needs - non-medical: Not on file  Occupational History  . Occupation: factory Surveyor, quantity  Tobacco Use  . Smoking status: Never Smoker  . Smokeless tobacco: Never Used  Substance and Sexual Activity  . Alcohol use: No  . Drug use: No  . Sexual activity: Not Currently    Partners: Male  Other Topics Concern  . Not on file  Social History Narrative   ** Merged History Encounter **    Exercise-no     PHYSICAL EXAM:  VS: BP 122/78 (BP Location: Left Arm, Patient Position: Sitting, Cuff Size: Normal)   Pulse 82   Temp 98.2 F (36.8 C) (Oral)  Ht 5' 6.14" (1.68 m)   Wt 157 lb (71.2 kg)   SpO2 98%   BMI 25.23 kg/m  Physical Exam Gen: NAD, alert, cooperative with exam, well-appearing ENT: normal lips, normal nasal mucosa,  Eye: normal EOM, normal conjunctiva and lids CV:  no edema, +2 pedal pulses   Resp: no accessory muscle use, non-labored,  Skin: no rashes, no areas of induration  Neuro: normal tone, normal sensation to touch Psych:  normal insight, alert and oriented MSK:  Neck: TTP of the right side of the neck Normal neck range of motion. Normal strength resistance of stroke. Normal range of motion of the shoulders. Normal grip strength. Neurovascular intact      ASSESSMENT & PLAN:   Neck  pain Muscular in nature. No radicular symptoms. -Provided meloxicam. - Counseled on home exercise therapy and supportive therapies -If no improvement in 4-6 weeks consider trigger point injections versus referral to physical therapy. Consider cervical images.

## 2017-12-07 NOTE — Assessment & Plan Note (Signed)
Muscular in nature. No radicular symptoms. -Provided meloxicam. - Counseled on home exercise therapy and supportive therapies -If no improvement in 4-6 weeks consider trigger point injections versus referral to physical therapy. Consider cervical images.

## 2017-12-07 NOTE — Patient Instructions (Signed)
Please take the meloxicam for 10 days straight and then as needed. Please try to rub on Aspercreme with lidocaine over this area. This is an over-the-counter medication. Please try to heat this area. Please try the exercises that I have provided for you at your pain is less than 2 out of 10. Please follow-up with me in 4 weeks for her symptoms are not improved or worsen.

## 2017-12-11 ENCOUNTER — Other Ambulatory Visit: Payer: Self-pay | Admitting: Family Medicine

## 2018-02-12 ENCOUNTER — Other Ambulatory Visit: Payer: BLUE CROSS/BLUE SHIELD

## 2018-02-13 ENCOUNTER — Other Ambulatory Visit: Payer: BLUE CROSS/BLUE SHIELD

## 2018-03-10 ENCOUNTER — Other Ambulatory Visit: Payer: Self-pay | Admitting: Endocrinology

## 2018-03-13 ENCOUNTER — Other Ambulatory Visit: Payer: Self-pay | Admitting: *Deleted

## 2018-03-13 MED ORDER — GLIMEPIRIDE 1 MG PO TABS: ORAL_TABLET | ORAL | 0 refills | Status: DC

## 2018-04-04 LAB — HM DIABETES EYE EXAM

## 2018-05-14 ENCOUNTER — Encounter: Payer: Self-pay | Admitting: Family Medicine

## 2018-05-14 ENCOUNTER — Ambulatory Visit (INDEPENDENT_AMBULATORY_CARE_PROVIDER_SITE_OTHER): Payer: BLUE CROSS/BLUE SHIELD | Admitting: Family Medicine

## 2018-05-14 VITALS — BP 120/82 | HR 84 | Temp 97.8°F | Ht 66.0 in | Wt 165.6 lb

## 2018-05-14 DIAGNOSIS — E1151 Type 2 diabetes mellitus with diabetic peripheral angiopathy without gangrene: Secondary | ICD-10-CM

## 2018-05-14 DIAGNOSIS — D229 Melanocytic nevi, unspecified: Secondary | ICD-10-CM

## 2018-05-14 DIAGNOSIS — E1169 Type 2 diabetes mellitus with other specified complication: Secondary | ICD-10-CM | POA: Diagnosis not present

## 2018-05-14 DIAGNOSIS — IMO0002 Reserved for concepts with insufficient information to code with codable children: Secondary | ICD-10-CM

## 2018-05-14 DIAGNOSIS — I10 Essential (primary) hypertension: Secondary | ICD-10-CM | POA: Diagnosis not present

## 2018-05-14 DIAGNOSIS — E785 Hyperlipidemia, unspecified: Secondary | ICD-10-CM

## 2018-05-14 DIAGNOSIS — Z889 Allergy status to unspecified drugs, medicaments and biological substances status: Secondary | ICD-10-CM

## 2018-05-14 DIAGNOSIS — E1165 Type 2 diabetes mellitus with hyperglycemia: Secondary | ICD-10-CM

## 2018-05-14 MED ORDER — MONTELUKAST SODIUM 10 MG PO TABS: 10.0000 mg | ORAL_TABLET | Freq: Every day | ORAL | 3 refills | Status: DC

## 2018-05-14 NOTE — Patient Instructions (Signed)

## 2018-05-14 NOTE — Assessment & Plan Note (Signed)
Has not taken her meds today Pt instructed to take meds daily

## 2018-05-14 NOTE — Progress Notes (Signed)
Patient ID: Claire Hill, female    DOB: 10/20/1971  Age: 47 y.o. MRN: [id number]    Subjective:  Subjective  HPI [name] DAYANARA SHERRILL presents for f/u dm, chol and bp.   HPI HYPERTENSION   Blood pressure range-not checking   Chest pain- no      Dyspnea- no Lightheadedness- no   Edema- no  Other side effects - no   Medication compliance: good Low salt diet- yes    DIABETES    Blood Sugar ranges-100-149  Polyuria- no New Visual problems- no  Hypoglycemic symptoms- no  Other side effects-no Medication compliance - good Last eye exam- 2 months ago Foot exam- today   HYPERLIPIDEMIA  Medication compliance- good RUQ pain- no  Muscle aches- no Other side effects-no    Review of Systems  Constitutional: Negative for appetite change, diaphoresis, fatigue and unexpected weight change.  Eyes: Negative for pain, redness and visual disturbance.  Respiratory: Negative for cough, chest tightness, shortness of breath and wheezing.   Cardiovascular: Negative for chest pain, palpitations and leg swelling.  Endocrine: Negative for cold intolerance, heat intolerance, polydipsia, polyphagia and polyuria.  Genitourinary: Negative for difficulty urinating, dysuria and frequency.  Neurological: Negative for dizziness, light-headedness, numbness and headaches.    History Past Medical History:  Diagnosis Date  . Allergy   . Anemia   . Asthma    pt states no-no inhalers  . COPD (chronic obstructive pulmonary disease) ([place])   . Diabetes mellitus   . Diabetes mellitus without complication ([place])   . Glaucoma   . Herpes    Right eye  . Herpes simplex of eye   . Hyperlipidemia   . OCD (obsessive compulsive disorder)     She has a past surgical history that includes No past surgeries and Mass excision (06/22/2012).   Her family history includes Cancer in her cousin and other; Diabetes in her father and paternal grandmother; Hypertension in her mother.She reports that she has  never smoked. She has never used smokeless tobacco. She reports that she does not drink alcohol or use drugs.  Current Outpatient Medications on File Prior to Visit  Medication Sig Dispense Refill  . aspirin (ASPIRIN EC) 81 MG EC tablet Take 1 tablet (81 mg total) by mouth daily. Swallow whole. 30 tablet 12  . atorvastatin (LIPITOR) 80 MG tablet Take 1 tablet (80 mg total) by mouth daily. 90 tablet 3  . azelastine (ASTELIN) 0.1 % nasal spray Place 2 sprays into both nostrils 2 (two) times daily. Use in each nostril as directed 30 mL 11  . blood glucose meter kit and supplies KIT Use a directed twice a day.  E11.8.  Use Relion Meter 1 each 0  . blood glucose meter kit and supplies KIT Dispense based on patient and insurance preference. Use up to four times daily as directed. (FOR ICD-9 250.00, 250.01). 1 each 0  . brimonidine (ALPHAGAN) 0.2 % ophthalmic solution   98  . brimonidine-timolol (COMBIGAN) 0.2-0.5 % ophthalmic solution Place 1 drop into the right eye every 12 (twelve) hours. Reported on 03/16/2016    . ezetimibe (ZETIA) 10 MG tablet Take 1 tablet (10 mg total) by mouth daily. 90 tablet 3  . famciclovir (FAMVIR) 500 MG tablet Take 1 tablet (500 mg total) by mouth 3 (three) times daily. 21 tablet 0  . fluticasone (FLONASE) 50 MCG/ACT nasal spray Place 2 sprays into both nostrils daily. 16 g 11  . glimepiride (AMARYL) 1 MG tablet TAKE 1/2 TABLET  BY MOUTH DAILY WITH BREAKFAST 45 tablet 0  . glucose blood (RELION GLUCOSE TEST STRIPS) test strip Use as instructed 100 each 5  . levocetirizine (XYZAL) 5 MG tablet Take 1 tablet (5 mg total) by mouth every evening. 90 tablet 3  . meloxicam (MOBIC) 15 MG tablet Take 1 tablet (15 mg total) by mouth daily. 30 tablet 0  . metFORMIN (GLUCOPHAGE-XR) 500 MG 24 hr tablet Take 2 tablets (1,000 mg total) by mouth daily. 180 tablet 1  . metFORMIN (GLUCOPHAGE-XR) 500 MG 24 hr tablet TAKE TWO TABLETS BY MOUTH DAILY 180 tablet 3  . NONFORMULARY OR COMPOUNDED  ITEM Alcohol pads  #1 box  Use as directed 1 each 5  . omeprazole (PRILOSEC) 40 MG capsule TAKE ONE CAPSULE BY MOUTH DAILY 30 capsule 4  . RELION LANCETS THIN 26G MISC Use as directed twice a day. E11.8 100 each 5  . valACYclovir (VALTREX) 1000 MG tablet Take 1,000 mg by mouth daily.     No current facility-administered medications on file prior to visit.      Objective:  Objective  Physical Exam  Constitutional: She is oriented to person, place, and time. She appears well-developed and well-nourished.  HENT:  Head: Normocephalic and atraumatic.  Eyes: Conjunctivae and EOM are normal.  Neck: Normal range of motion. Neck supple. No JVD present. Carotid bruit is not present. No thyromegaly present.  Cardiovascular: Normal rate, regular rhythm and normal heart sounds.  No murmur heard. Pulmonary/Chest: Effort normal and breath sounds normal. No respiratory distress. She has no wheezes. She has no rales. She exhibits no tenderness.  Musculoskeletal: She exhibits no edema.  Neurological: She is alert and oriented to person, place, and time.  Psychiatric: She has a normal mood and affect.  Nursing note and vitals reviewed.  Diabetic Foot Exam - Simple   Simple Foot Form Diabetic Foot exam was performed with the following findings:  Yes 05/14/2018 12:35 PM  Visual Inspection No deformities, no ulcerations, no other skin breakdown bilaterally:  Yes Sensation Testing Intact to touch and monofilament testing bilaterally:  Yes Pulse Check Posterior Tibialis and Dorsalis pulse intact bilaterally:  Yes Comments     BP 120/82 (BP Location: Left Arm, Patient Position: Sitting, Cuff Size: Large)   Pulse 84   Temp 97.8 F (36.6 C) (Oral)   Ht '5\' 6"'$  (1.676 m)   Wt 165 lb 9.6 oz (75.1 kg)   SpO2 96%   BMI 26.73 kg/m  Wt Readings from Last 3 Encounters:  05/14/18 165 lb 9.6 oz (75.1 kg)  12/07/17 157 lb (71.2 kg)  11/13/17 155 lb 12.8 oz (70.7 kg)     Lab Results  Component Value  Date   WBC 3.4 (L) 05/15/2017   HGB 13.1 05/15/2017   HCT 40.6 05/15/2017   PLT 262.0 05/15/2017   GLUCOSE 122 (H) 11/13/2017   CHOL 223 (H) 11/13/2017   TRIG 92.0 11/13/2017   HDL 88.60 11/13/2017   LDLDIRECT 173.1 03/30/2012   LDLCALC 116 (H) 11/13/2017   ALT 21 11/13/2017   AST 25 11/13/2017   NA 142 11/13/2017   K 4.2 11/13/2017   CL 104 11/13/2017   CREATININE 0.68 11/13/2017   BUN 14 11/13/2017   CO2 31 11/13/2017   TSH 1.35 11/13/2017   HGBA1C 6.7 (H) 11/13/2017   MICROALBUR 0.3 11/14/2016    Dg Foot Complete Left  Result Date: 08/01/2017 CLINICAL DATA:  Left great toe pain for 2 months.  No known injury. EXAM: LEFT  FOOT - COMPLETE 3+ VIEW COMPARISON:  None. FINDINGS: There is no evidence of fracture or dislocation. There is no evidence of arthropathy or other focal bone abnormality. Soft tissues are unremarkable. IMPRESSION: Negative exam. Electronically Signed   By: Inge Rise M.D.   On: 08/01/2017 10:52     Assessment & Plan:  Plan  I am having [name] V Ogan maintain her valACYclovir, aspirin, brimonidine-timolol, brimonidine, blood glucose meter kit and supplies, RELION LANCETS THIN 26G, glucose blood, NONFORMULARY OR COMPOUNDED ITEM, famciclovir, omeprazole, metFORMIN, azelastine, fluticasone, levocetirizine, atorvastatin, blood glucose meter kit and supplies, ezetimibe, meloxicam, metFORMIN, glimepiride, and montelukast.  Meds ordered this encounter  Medications  . montelukast (SINGULAIR) 10 MG tablet    Sig: Take 1 tablet (10 mg total) by mouth at bedtime.    Dispense:  30 tablet    Refill:  3    Problem List Items Addressed This Visit      Unprioritized   DM (diabetes mellitus) type II uncontrolled, periph vascular disorder ([place]) - Primary    hgba1c to be checked, minimize simple carbs. Increase exercise as tolerated. Continue current meds       Relevant Orders   Lipid panel   Hemoglobin A1c   Comprehensive metabolic panel   Essential  hypertension    Has not taken her meds today Pt instructed to take meds daily      Relevant Orders   Lipid panel   Hemoglobin A1c   Comprehensive metabolic panel   H/O seasonal allergies   Relevant Medications   montelukast (SINGULAIR) 10 MG tablet   Hyperlipidemia    Tolerating statin, encouraged heart healthy diet, avoid trans fats, minimize simple carbs and saturated fats. Increase exercise as tolerated      Hyperlipidemia associated with type 2 diabetes mellitus ([place])   Relevant Orders   Lipid panel   Hemoglobin A1c   Comprehensive metabolic panel   Suspicious nevus   Relevant Orders   Ambulatory referral to Dermatology      Follow-up: Return in about 6 months (around 11/14/2018) for annual exam, fasting.  Ann Held, DO

## 2018-05-14 NOTE — Assessment & Plan Note (Signed)
hgba1c to be checked, minimize simple carbs. Increase exercise as tolerated. Continue current meds  

## 2018-05-14 NOTE — Assessment & Plan Note (Signed)
Tolerating statin, encouraged heart healthy diet, avoid trans fats, minimize simple carbs and saturated fats. Increase exercise as tolerated 

## 2018-06-09 ENCOUNTER — Other Ambulatory Visit: Payer: Self-pay | Admitting: Family Medicine

## 2018-07-27 ENCOUNTER — Other Ambulatory Visit: Payer: Self-pay

## 2018-07-27 ENCOUNTER — Emergency Department (HOSPITAL_BASED_OUTPATIENT_CLINIC_OR_DEPARTMENT_OTHER): Payer: BLUE CROSS/BLUE SHIELD

## 2018-07-27 ENCOUNTER — Emergency Department (HOSPITAL_BASED_OUTPATIENT_CLINIC_OR_DEPARTMENT_OTHER)
Admission: EM | Admit: 2018-07-27 | Discharge: 2018-07-27 | Disposition: A | Discharge status: Home / Self Care | Payer: BLUE CROSS/BLUE SHIELD | Attending: Emergency Medicine | Admitting: Emergency Medicine

## 2018-07-27 ENCOUNTER — Encounter (HOSPITAL_BASED_OUTPATIENT_CLINIC_OR_DEPARTMENT_OTHER): Payer: Self-pay | Admitting: Emergency Medicine

## 2018-07-27 DIAGNOSIS — Y929 Unspecified place or not applicable: Secondary | ICD-10-CM | POA: Insufficient documentation

## 2018-07-27 DIAGNOSIS — Z7982 Long term (current) use of aspirin: Secondary | ICD-10-CM | POA: Insufficient documentation

## 2018-07-27 DIAGNOSIS — Z7984 Long term (current) use of oral hypoglycemic drugs: Secondary | ICD-10-CM | POA: Diagnosis not present

## 2018-07-27 DIAGNOSIS — I10 Essential (primary) hypertension: Secondary | ICD-10-CM | POA: Insufficient documentation

## 2018-07-27 DIAGNOSIS — Y999 Unspecified external cause status: Secondary | ICD-10-CM | POA: Diagnosis not present

## 2018-07-27 DIAGNOSIS — E119 Type 2 diabetes mellitus without complications: Secondary | ICD-10-CM | POA: Diagnosis not present

## 2018-07-27 DIAGNOSIS — Z79899 Other long term (current) drug therapy: Secondary | ICD-10-CM | POA: Diagnosis not present

## 2018-07-27 DIAGNOSIS — Y939 Activity, unspecified: Secondary | ICD-10-CM | POA: Diagnosis not present

## 2018-07-27 DIAGNOSIS — J449 Chronic obstructive pulmonary disease, unspecified: Secondary | ICD-10-CM | POA: Insufficient documentation

## 2018-07-27 DIAGNOSIS — S1093XA Contusion of unspecified part of neck, initial encounter: Secondary | ICD-10-CM | POA: Diagnosis not present

## 2018-07-27 DIAGNOSIS — S199XXA Unspecified injury of neck, initial encounter: Secondary | ICD-10-CM | POA: Diagnosis present
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
## 2018-07-27 NOTE — Discharge Instructions (Addendum)
Apply ice for thirty minutes, 3-4 times a day.  Take acetaminophen or ibuprofen as needed for pain.

## 2018-08-11 ENCOUNTER — Other Ambulatory Visit: Payer: Self-pay | Admitting: Family Medicine

## 2018-08-11 DIAGNOSIS — Z889 Allergy status to unspecified drugs, medicaments and biological substances status: Secondary | ICD-10-CM

## 2018-09-19 ENCOUNTER — Ambulatory Visit (INDEPENDENT_AMBULATORY_CARE_PROVIDER_SITE_OTHER): Payer: BLUE CROSS/BLUE SHIELD

## 2018-09-19 DIAGNOSIS — Z23 Encounter for immunization: Secondary | ICD-10-CM | POA: Diagnosis not present

## 2018-11-09 ENCOUNTER — Other Ambulatory Visit: Payer: Self-pay | Admitting: Medical

## 2018-11-09 DIAGNOSIS — J301 Allergic rhinitis due to pollen: Secondary | ICD-10-CM

## 2018-11-16 ENCOUNTER — Ambulatory Visit (INDEPENDENT_AMBULATORY_CARE_PROVIDER_SITE_OTHER): Payer: BLUE CROSS/BLUE SHIELD | Admitting: Family Medicine

## 2018-11-16 ENCOUNTER — Encounter: Payer: Self-pay | Admitting: Family Medicine

## 2018-11-16 VITALS — BP 106/76 | HR 70 | Temp 98.3°F | Resp 16 | Ht 66.0 in | Wt 163.2 lb

## 2018-11-16 DIAGNOSIS — E785 Hyperlipidemia, unspecified: Secondary | ICD-10-CM

## 2018-11-16 DIAGNOSIS — T7840XA Allergy, unspecified, initial encounter: Secondary | ICD-10-CM | POA: Insufficient documentation

## 2018-11-16 DIAGNOSIS — E1169 Type 2 diabetes mellitus with other specified complication: Secondary | ICD-10-CM | POA: Diagnosis not present

## 2018-11-16 DIAGNOSIS — E119 Type 2 diabetes mellitus without complications: Secondary | ICD-10-CM

## 2018-11-16 DIAGNOSIS — I10 Essential (primary) hypertension: Secondary | ICD-10-CM | POA: Diagnosis not present

## 2018-11-16 DIAGNOSIS — Z0000 Encounter for general adult medical examination without abnormal findings: Secondary | ICD-10-CM | POA: Diagnosis not present

## 2018-11-16 DIAGNOSIS — T7840XD Allergy, unspecified, subsequent encounter: Secondary | ICD-10-CM

## 2018-11-16 LAB — COMPREHENSIVE METABOLIC PANEL
ALT: 43 U/L — ABNORMAL HIGH (ref 0–35)
AST: 32 U/L (ref 0–37)
Albumin: 4.4 g/dL (ref 3.5–5.2)
Alkaline Phosphatase: 91 U/L (ref 39–117)
BUN: 13 mg/dL (ref 6–23)
CO2: 31 meq/L (ref 19–32)
Calcium: 9.8 mg/dL (ref 8.4–10.5)
Chloride: 106 mEq/L (ref 96–112)
Creatinine, Ser: 0.66 mg/dL (ref 0.40–1.20)
GFR: 95.79 mL/min (ref >60.00)
GLUCOSE: 146 mg/dL — AB (ref 70–99)
Potassium: 4.5 mEq/L (ref 3.5–5.1)
Sodium: 144 mEq/L (ref 135–145)
Total Bilirubin: 0.6 mg/dL (ref 0.2–1.2)
Total Protein: 6.5 g/dL (ref 6.0–8.3)

## 2018-11-16 LAB — CBC WITH DIFFERENTIAL/PLATELET
Basophils Absolute: 0 10*3/uL (ref 0.0–0.1)
Basophils Relative: 1.1 % (ref 0.0–3.0)
Eosinophils Absolute: 0.1 10*3/uL (ref 0.0–0.7)
Eosinophils Relative: 4.4 % (ref 0.0–5.0)
HCT: 42.6 % (ref 36.0–46.0)
Hemoglobin: 13.8 g/dL (ref 12.0–15.0)
Lymphocytes Relative: 49.8 % — ABNORMAL HIGH (ref 12.0–46.0)
Lymphs Abs: 1.6 10*3/uL (ref 0.7–4.0)
MCHC: 32.5 g/dL (ref 30.0–36.0)
MCV: 91.9 fl (ref 78.0–100.0)
MONO ABS: 0.2 10*3/uL (ref 0.1–1.0)
Monocytes Relative: 5.9 % (ref 3.0–12.0)
Neutro Abs: 1.3 10*3/uL — ABNORMAL LOW (ref 1.4–7.7)
Neutrophils Relative %: 38.8 % — ABNORMAL LOW (ref 43.0–77.0)
Platelets: 248 10*3/uL (ref 150.0–400.0)
RBC: 4.63 Mil/uL (ref 3.87–5.11)
RDW: 13.6 % (ref 11.5–15.5)
WBC: 3.2 10*3/uL — ABNORMAL LOW (ref 4.0–10.5)

## 2018-11-16 LAB — LIPID PANEL
Cholesterol: 201 mg/dL — ABNORMAL HIGH (ref 0–200)
HDL: 79.5 mg/dL (ref >39.00)
LDL Cholesterol: 99 mg/dL (ref 0–99)
NONHDL: 121.43
Total CHOL/HDL Ratio: 3
Triglycerides: 111 mg/dL (ref 0.0–149.0)
VLDL: 22.2 mg/dL (ref 0.0–40.0)

## 2018-11-16 LAB — TSH: TSH: 1.57 u[IU]/mL (ref 0.35–4.50)

## 2018-11-16 LAB — MICROALBUMIN / CREATININE URINE RATIO
Creatinine,U: 240.5 mg/dL
Microalb Creat Ratio: 0.6 mg/g (ref 0.0–30.0)
Microalb, Ur: 1.4 mg/dL (ref 0.0–1.9)

## 2018-11-16 LAB — HEMOGLOBIN A1C: Hgb A1c MFr Bld: 6.9 % — ABNORMAL HIGH (ref 4.6–6.5)

## 2018-11-16 NOTE — Progress Notes (Signed)
+ Subjective:     Claire Hill is a 48 y.o. female and is here for cpe.  C/o allergy symptoms and runny nose HPI HYPERTENSION   Blood pressure range-not checking   Chest pain- no      Dyspnea- no Lightheadedness- no   Edema- no  Other side effects - no   Medication compliance: goos Low salt diet- no    DIABETES    Blood Sugar ranges- 78-119 Polyuria- no New Visual problems- no  Hypoglycemic symptoms- no  Other side effects-no Medication compliance - good Last eye exam- 09/2018 Foot exam- today   HYPERLIPIDEMIA  Medication compliance- good RUQ pain- no  Muscle aches- no Other side effects-no     here for a comprehensive physical exam. The patient reports no problems.  Social History   Socioeconomic History  . Marital status: Married    Spouse name: Not on file  . Number of children: Not on file  . Years of education: Not on file  . Highest education level: Not on file  Occupational History  . Occupation: factory Government social research officer  . Financial resource strain: Not on file  . Food insecurity:    Worry: Not on file    Inability: Not on file  . Transportation needs:    Medical: Not on file    Non-medical: Not on file  Tobacco Use  . Smoking status: Never Smoker  . Smokeless tobacco: Never Used  Substance and Sexual Activity  . Alcohol use: No  . Drug use: No  . Sexual activity: Not Currently    Partners: Male  Lifestyle  . Physical activity:    Days per week: Not on file    Minutes per session: Not on file  . Stress: Not on file  Relationships  . Social connections:    Talks on phone: Not on file    Gets together: Not on file    Attends religious service: Not on file    Active member of club or organization: Not on file    Attends meetings of clubs or organizations: Not on file    Relationship status: Not on file  . Intimate partner violence:    Fear of current or ex partner: Not on file    Emotionally abused: Not on file     Physically abused: Not on file    Forced sexual activity: Not on file  Other Topics Concern  . Not on file  Social History Narrative   ** Merged History Encounter **    Exercise-no   Health Maintenance  Topic Date Due  . HIV Screening  05/19/1986  . URINE MICROALBUMIN  11/14/2017  . HEMOGLOBIN A1C  05/13/2018  . MAMMOGRAM  05/25/2018  . OPHTHALMOLOGY EXAM  04/05/2019  . FOOT EXAM  05/15/2019  . PAP SMEAR-Modifier  06/14/2020  . TETANUS/TDAP  10/24/2020  . INFLUENZA VACCINE  Completed  . PNEUMOCOCCAL POLYSACCHARIDE VACCINE AGE 72-64 HIGH RISK  Completed    The following portions of the patient's history were reviewed and updated as appropriate:  She  has a past medical history of Allergy, Anemia, Asthma, COPD (chronic obstructive pulmonary disease) ([place]), Diabetes mellitus, Diabetes mellitus without complication ([place]), Glaucoma, Herpes, Herpes simplex of eye, Hyperlipidemia, and OCD (obsessive compulsive disorder). She does not have any pertinent problems on file. She  has a past surgical history that includes No past surgeries and Mass excision (06/22/2012). Her family history includes Cancer in her cousin and another family member; Diabetes in her  father and paternal grandmother; Hypertension in her mother. She  reports that she has never smoked. She has never used smokeless tobacco. She reports that she does not drink alcohol or use drugs. She has a current medication list which includes the following prescription(s): aspirin, atorvastatin, azelastine, blood glucose meter kit and supplies, blood glucose meter kit and supplies, brimonidine, brimonidine-timolol, ezetimibe, famciclovir, fluticasone, glimepiride, glucose blood, levocetirizine, meloxicam, metformin, metformin, montelukast, NONFORMULARY OR COMPOUNDED ITEM, omeprazole, relion lancets thin 26g, and valacyclovir. Current Outpatient Medications on File Prior to Visit  Medication Sig Dispense Refill  . aspirin (ASPIRIN EC) 81 MG  EC tablet Take 1 tablet (81 mg total) by mouth daily. Swallow whole. 30 tablet 12  . atorvastatin (LIPITOR) 80 MG tablet Take 1 tablet (80 mg total) by mouth daily. 90 tablet 3  . azelastine (ASTELIN) 0.1 % nasal spray Place 2 sprays into both nostrils 2 (two) times daily. Use in each nostril as directed 30 mL 11  . blood glucose meter kit and supplies KIT Use a directed twice a day.  E11.8.  Use Relion Meter 1 each 0  . blood glucose meter kit and supplies KIT Dispense based on patient and insurance preference. Use up to four times daily as directed. (FOR ICD-9 250.00, 250.01). 1 each 0  . brimonidine (ALPHAGAN) 0.2 % ophthalmic solution   98  . brimonidine-timolol (COMBIGAN) 0.2-0.5 % ophthalmic solution Place 1 drop into the right eye every 12 (twelve) hours. Reported on 03/16/2016    . ezetimibe (ZETIA) 10 MG tablet Take 1 tablet (10 mg total) by mouth daily. 90 tablet 3  . famciclovir (FAMVIR) 500 MG tablet Take 1 tablet (500 mg total) by mouth 3 (three) times daily. 21 tablet 0  . fluticasone (FLONASE) 50 MCG/ACT nasal spray Place 2 sprays into both nostrils daily. 16 g 11  . glimepiride (AMARYL) 1 MG tablet TAKE 1/2 OF A TABLET BY MOUTH DAILY WITH BREAKFAST 45 tablet 1  . glucose blood (RELION GLUCOSE TEST STRIPS) test strip Use as instructed 100 each 5  . levocetirizine (XYZAL) 5 MG tablet TAKE ONE TABLET BY MOUTH EVERY EVENING 90 tablet 3  . meloxicam (MOBIC) 15 MG tablet Take 1 tablet (15 mg total) by mouth daily. 30 tablet 0  . metFORMIN (GLUCOPHAGE-XR) 500 MG 24 hr tablet Take 2 tablets (1,000 mg total) by mouth daily. 180 tablet 1  . metFORMIN (GLUCOPHAGE-XR) 500 MG 24 hr tablet TAKE TWO TABLETS BY MOUTH DAILY 180 tablet 3  . montelukast (SINGULAIR) 10 MG tablet Take 1 tablet (10 mg total) by mouth at bedtime. 90 tablet 3  . NONFORMULARY OR COMPOUNDED ITEM Alcohol pads  #1 box  Use as directed 1 each 5  . omeprazole (PRILOSEC) 40 MG capsule TAKE ONE CAPSULE BY MOUTH DAILY 30 capsule 4   . RELION LANCETS THIN 26G MISC Use as directed twice a day. E11.8 100 each 5  . valACYclovir (VALTREX) 1000 MG tablet Take 1,000 mg by mouth daily.     No current facility-administered medications on file prior to visit.    She has No Known Allergies..  Review of Systems Review of Systems  Constitutional: Negative for activity change, appetite change and fatigue.  HENT: Negative for hearing loss, congestion, tinnitus and ear discharge.  dentist q85mEyes: Negative for visual disturbance (see optho q1y -- vision corrected to 20/20 with glasses).  Respiratory: Negative for cough, chest tightness and shortness of breath.   Cardiovascular: Negative for chest pain, palpitations and leg swelling.  Gastrointestinal: Negative for abdominal pain, diarrhea, constipation and abdominal distention.  Genitourinary: Negative for urgency, frequency, decreased urine volume and difficulty urinating.  Musculoskeletal: Negative for back pain, arthralgias and gait problem.  Skin: Negative for color change, pallor and rash.  Neurological: Negative for dizziness, light-headedness, numbness and headaches.  Hematological: Negative for adenopathy. Does not bruise/bleed easily.  Psychiatric/Behavioral: Negative for suicidal ideas, confusion, sleep disturbance, self-injury, dysphoric mood, decreased concentration and agitation.       Objective:    BP 106/76 (BP Location: Right Arm, Cuff Size: Normal)   Pulse 70   Temp 98.3 F (36.8 C) (Oral)   Resp 16   Ht 5' 6" (1.676 m)   Wt 163 lb 3.2 oz (74 kg)   LMP  (LMP Unknown) Comment: patient could not remember last period 05/15/17  SpO2 98%   BMI 26.34 kg/m  General appearance: alert, cooperative, appears stated age and no distress Head: Normocephalic, without obvious abnormality, atraumatic Eyes: conjunctivae/corneas clear. PERRL, EOM's intact. Fundi benign. Ears: normal TM's and external ear canals both ears Nose: Nares normal. Septum midline. Mucosa  normal. No drainage or sinus tenderness. Throat: lips, mucosa, and tongue normal; teeth and gums normal Neck: no adenopathy, no carotid bruit, no JVD, supple, symmetrical, trachea midline and thyroid not enlarged, symmetric, no tenderness/mass/nodules Back: symmetric, no curvature. ROM normal. No CVA tenderness. Lungs: clear to auscultation bilaterally Breasts: normal appearance, no masses or tenderness Heart: regular rate and rhythm, S1, S2 normal, no murmur, click, rub or gallop Abdomen: soft, non-tender; bowel sounds normal; no masses,  no organomegaly Pelvic: deferred Extremities: extremities normal, atraumatic, no cyanosis or edema Pulses: 2+ and symmetric Skin: dry irritated skine Lymph nodes: Cervical, supraclavicular, and axillary nodes normal. Neurologic: Alert and oriented X 3, normal strength and tone. Normal symmetric reflexes. Normal coordination and gait    Assessment:    Healthy female exam.      Plan:    ghm utd Check labs See After Visit Summary for Counseling Recommendations    1. Controlled type 2 diabetes mellitus without complication, without long-term current use of insulin ([place]) See above - Hemoglobin A1c - CBC with Differential/Platelet - TSH - Comprehensive metabolic panel - Microalbumin / creatinine urine ratio  2. Essential hypertension Well controlled, no changes to meds. Encouraged heart healthy diet such as the DASH diet and exercise as tolerated.  - CBC with Differential/Platelet - TSH - Comprehensive metabolic panel  3. Hyperlipidemia associated with type 2 diabetes mellitus ([place]) Tolerating statin, encouraged heart healthy diet, avoid trans fats, minimize simple carbs and saturated fats. Increase exercise as tolerated - Lipid panel - CBC with Differential/Platelet - TSH . 4. Preventative health care See above - Lipid panel - Hemoglobin A1c - CBC with Differential/Platelet - TSH - Comprehensive metabolic panel  5. Allergic state,  subsequent encounter  - Ambulatory referral to Allergy

## 2018-11-16 NOTE — Assessment & Plan Note (Signed)
Take allergy meds Use eucerin or aquaphor for skin

## 2018-11-16 NOTE — Patient Instructions (Addendum)
Use---  eucerin cream or aquaphor for your hands ----- let us know if it does not help      Preventive Care 40-64 Years, Female Preventive care refers to lifestyle choices and visits with your health care provider that can promote health and wellness. What does preventive care include?   A yearly physical exam. This is also called an annual well check.  Dental exams once or twice a year.  Routine eye exams. Ask your health care provider how often you should have your eyes checked.  Personal lifestyle choices, including: ? Daily care of your teeth and gums. ? Regular physical activity. ? Eating a healthy diet. ? Avoiding tobacco and drug use. ? Limiting alcohol use. ? Practicing safe sex. ? Taking low-dose aspirin daily starting at age 65. ? Taking vitamin and mineral supplements as recommended by your health care provider. What happens during an annual well check? The services and screenings done by your health care provider during your annual well check will depend on your age, overall health, lifestyle risk factors, and family history of disease. Counseling Your health care provider may ask you questions about your:  Alcohol use.  Tobacco use.  Drug use.  Emotional well-being.  Home and relationship well-being.  Sexual activity.  Eating habits.  Work and work Statistician.  Method of birth control.  Menstrual cycle.  Pregnancy history. Screening You may have the following tests or measurements:  Height, weight, and BMI.  Blood pressure.  Lipid and cholesterol levels. These may be checked every 5 years, or more frequently if you are over 79 years old.  Skin check.  Lung cancer screening. You may have this screening every year starting at age 38 if you have a 30-pack-year history of smoking and currently smoke or have quit within the past 15 years.  Colorectal cancer screening. All adults should have this screening starting at age 39 and continuing  until age 33. Your health care provider may recommend screening at age 60. You will have tests every 1-10 years, depending on your results and the type of screening test. People at increased risk should start screening at an earlier age. Screening tests may include: ? Guaiac-based fecal occult blood testing. ? Fecal immunochemical test (FIT). ? Stool DNA test. ? Virtual colonoscopy. ? Sigmoidoscopy. During this test, a flexible tube with a tiny camera (sigmoidoscope) is used to examine your rectum and lower colon. The sigmoidoscope is inserted through your anus into your rectum and lower colon. ? Colonoscopy. During this test, a long, thin, flexible tube with a tiny camera (colonoscope) is used to examine your entire colon and rectum.  Hepatitis C blood test.  Hepatitis B blood test.  Sexually transmitted disease (STD) testing.  Diabetes screening. This is done by checking your blood sugar (glucose) after you have not eaten for a while (fasting). You may have this done every 1-3 years.  Mammogram. This may be done every 1-2 years. Talk to your health care provider about when you should start having regular mammograms. This may depend on whether you have a family history of breast cancer.  BRCA-related cancer screening. This may be done if you have a family history of breast, ovarian, tubal, or peritoneal cancers.  Pelvic exam and Pap test. This may be done every 3 years starting at age 68. Starting at age 82, this may be done every 5 years if you have a Pap test in combination with an HPV test.  Bone density scan. This is done  to screen for osteoporosis. You may have this scan if you are at high risk for osteoporosis. Discuss your test results, treatment options, and if necessary, the need for more tests with your health care provider. Vaccines Your health care provider may recommend certain vaccines, such as:  Influenza vaccine. This is recommended every year.  Tetanus, diphtheria, and  acellular pertussis (Tdap, Td) vaccine. You may need a Td booster every 10 years.  Varicella vaccine. You may need this if you have not been vaccinated.  Zoster vaccine. You may need this after age 43.  Measles, mumps, and rubella (MMR) vaccine. You may need at least one dose of MMR if you were born in 1957 or later. You may also need a second dose.  Pneumococcal 13-valent conjugate (PCV13) vaccine. You may need this if you have certain conditions and were not previously vaccinated.  Pneumococcal polysaccharide (PPSV23) vaccine. You may need one or two doses if you smoke cigarettes or if you have certain conditions.  Meningococcal vaccine. You may need this if you have certain conditions.  Hepatitis A vaccine. You may need this if you have certain conditions or if you travel or work in places where you may be exposed to hepatitis A.  Hepatitis B vaccine. You may need this if you have certain conditions or if you travel or work in places where you may be exposed to hepatitis B.  Haemophilus influenzae type b (Hib) vaccine. You may need this if you have certain conditions. Talk to your health care provider about which screenings and vaccines you need and how often you need them. This information is not intended to replace advice given to you by your health care provider. Make sure you discuss any questions you have with your health care provider. Document Released: 11/06/2015 Document Revised: 11/30/2017 Document Reviewed: 08/11/2015 Elsevier Interactive Patient Education  2019 [employer].

## 2018-11-23 ENCOUNTER — Other Ambulatory Visit: Payer: Self-pay | Admitting: Family Medicine

## 2018-11-23 DIAGNOSIS — IMO0002 Reserved for concepts with insufficient information to code with codable children: Secondary | ICD-10-CM

## 2018-11-23 DIAGNOSIS — E1165 Type 2 diabetes mellitus with hyperglycemia: Secondary | ICD-10-CM

## 2018-11-23 DIAGNOSIS — E785 Hyperlipidemia, unspecified: Principal | ICD-10-CM

## 2018-11-23 DIAGNOSIS — E1169 Type 2 diabetes mellitus with other specified complication: Secondary | ICD-10-CM

## 2018-11-23 DIAGNOSIS — E1151 Type 2 diabetes mellitus with diabetic peripheral angiopathy without gangrene: Secondary | ICD-10-CM

## 2018-12-04 ENCOUNTER — Other Ambulatory Visit: Payer: Self-pay | Admitting: Family Medicine

## 2018-12-19 ENCOUNTER — Encounter: Payer: Self-pay | Admitting: Allergy

## 2018-12-19 ENCOUNTER — Ambulatory Visit: Payer: BLUE CROSS/BLUE SHIELD | Admitting: Allergy

## 2018-12-19 VITALS — BP 130/86 | HR 87 | Temp 98.3°F | Resp 16 | Ht 67.0 in | Wt 166.0 lb

## 2018-12-19 DIAGNOSIS — R12 Heartburn: Secondary | ICD-10-CM | POA: Diagnosis not present

## 2018-12-19 DIAGNOSIS — J301 Allergic rhinitis due to pollen: Secondary | ICD-10-CM

## 2018-12-19 DIAGNOSIS — L2089 Other atopic dermatitis: Secondary | ICD-10-CM

## 2018-12-19 DIAGNOSIS — Z889 Allergy status to unspecified drugs, medicaments and biological substances status: Secondary | ICD-10-CM

## 2018-12-19 DIAGNOSIS — J3089 Other allergic rhinitis: Secondary | ICD-10-CM

## 2018-12-19 MED ORDER — MONTELUKAST SODIUM 10 MG PO TABS: 10.0000 mg | ORAL_TABLET | Freq: Every day | ORAL | 3 refills | Status: DC

## 2018-12-19 MED ORDER — CRISABOROLE 2 % EX OINT: 1.0000 "application " | TOPICAL_OINTMENT | Freq: Two times a day (BID) | CUTANEOUS | 5 refills | Status: DC

## 2018-12-19 MED ORDER — FAMOTIDINE 20 MG PO TABS: 20.0000 mg | ORAL_TABLET | Freq: Every day | ORAL | 1 refills | Status: DC

## 2018-12-19 NOTE — Progress Notes (Signed)
New Patient Note  RE: Claire Hill MRN: [id number] DOB: 1971-05-31 Date of Office Visit: 12/19/2018  Referring provider: Ann Held, * Primary care provider: Carollee Herter, Alferd Apa, DO  Chief Complaint: [name]; Allergic Rhinitis  (clearing throat, post nasal drip ); and Hoarse (tacos, [place], pork, salad, possible throat swelling )  History of Present Illness: I had the pleasure of seeing Claire Hill for initial evaluation at the [facility] [place] on 12/19/2018. She is a 48 y.o. female, who is referred here by Ann Held, DO for the evaluation of allergic rhinitis and food allergies.   Rhinitis:  She reports symptoms of ear pain, PND, rhinorrhea, congestion, itchy eyes. Symptoms have been going on for 2 years. The symptoms are present all year around. Anosmia: no. Headache: yes. She has used Astelin 2 sprays BID with some improvement in symptoms. Tried Flonase with minimal benefit. Sinus infections: not sure. Previous work up includes: not sure. Previous ENT evaluation: not sure.   Food: Patient noticed clearing of her throat after eating [place] food such as tacos, [place], pork, oil, salad. Sometimes she also has vomiting after this. Does not have these symptoms after every time she eats these foods. Patient has heartburn and is on omeprazole.  Denies any associated rash or respiratory compromise. Symptoms usually lasts for 1-2 hours.   Past work up includes:not sure.  Dietary History: patient has been eating other foods including milk, eggs, peanut, treenuts, sesame, shellfish, seafood, soy, wheat, meats, fruits and vegetables.  Assessment and Plan: Claire V is a 48 y.o. female with: Other allergic rhinitis Perennial rhinoconjunctivitis symptoms for the past 2 years.  Used Astelin with some benefit.  Flonase did not work as well.  Does not recall previous ENT or allergy work-up.  Today's skin testing showed: Positive to grass  pollen, weed, ragweed, dust mites and cat.  Discussed environmental control measures.  May use over the counter antihistamines such as Zyrtec (cetirizine), Claritin (loratadine), Allegra (fexofenadine), or Xyzal (levocetirizine) daily as needed.  Continue Astelin 1-2 sprays twice a day as needed for drainage.  Continue Flonase 2 sprays daily for nasal congestion.  Start Singulair 68m daily.  If above regimen does not control symptoms then will discuss allergy immunotherapy in more detail at next visit.  Heartburn Patient concerned about food allergies as she noticed clearing of her throat after eating [place] such as tacos, t[place] pork, or salad.  Sometimes she also has emesis episodes.  However she can eat these foods at times without any symptoms.  She does have heartburn and is on omeprazole currently.  Today's skin testing was negative to foods.  Discussed with patient and based on her symptoms they are more concerning for heartburn/reflux.  Continue omeprazole 40 mg daily and will add on Pepcid 20 mg daily.  Continue reflux lifestyle modification diet.  If symptoms persist then will recommend EGD.  Other atopic dermatitis Patient has some mild atopic dermatitis on her hands.  Discussed proper skin care.  Use eucrisa twice a day on the hands for 1 month and see if the rash/itching improves.   Return in about 2 months (around 02/17/2019).  Meds ordered this encounter  Medications  . Crisaborole (EUCRISA) 2 % OINT    Sig: Apply 1 application topically 2 (two) times daily.    Dispense:  60 g    Refill:  5  . famotidine (PEPCID) 20 MG tablet    Sig: Take 1 tablet (  20 mg total) by mouth daily.    Dispense:  90 tablet    Refill:  1  . montelukast (SINGULAIR) 10 MG tablet    Sig: Take 1 tablet (10 mg total) by mouth at bedtime.    Dispense:  90 tablet    Refill:  3   Other allergy screening: Asthma: no Medication allergy: no Hymenoptera allergy: no Urticaria:  no Eczema:no History of recurrent infections suggestive of immunodeficency: no  Diagnostics: Skin Testing: Environmental allergy panel and select foods. Positive test to: grass pollen, weed, ragweed, dust mites and cat. Negative test to: foods.  Results discussed with patient/family. Airborne Adult Perc - 12/19/18 1036    Time Antigen Placed  1036    Allergen Manufacturer  Lavella Hammock    Location  Back    Number of Test  69    Panel 1  Select    1. Control-Buffer 50% Glycerol  Negative    2. Control-Histamine 1 mg/ml  3+    3. Albumin saline  Negative    4. Housatonic  4+    5. [place]  4+    6. Johnson  4+    7. [place] Blue  3+    8. Meadow Fescue  Negative    9. Perennial Rye  4+    10. Sweet Vernal  3+    11. Timothy  4+    12. Cocklebur  Negative    13. Burweed Marshelder  Negative    14. Ragweed, short  Negative    15. Ragweed, Giant  Negative    16. Plantain,  English  2+    17. Lamb's Quarters  Negative    18. Sheep Sorrell  2+    19. Rough Pigweed  Negative    20. Marsh Elder, Rough  Negative    21. Mugwort, Common  Negative    22. Ash mix  Negative    23. Birch mix  Negative    24. Beech American  Negative    25. Box, Elder  Negative    26. Cedar, red  Negative    27. Cottonwood, [place]  Negative    28. Elm mix  Negative    29. Hickory mix  Negative    30. Maple mix  Negative    31. Oak, [place] mix  Negative    32. Pecan Pollen  Negative    33. Pine mix  Negative    34. Sycamore Eastern  Negative    35. Kanosh, Black Pollen  Negative    36. Alternaria alternata  Negative    37. Cladosporium Herbarum  Negative    38. Aspergillus mix  Negative    39. Penicillium mix  Negative    40. Bipolaris sorokiniana (Helminthosporium)  Negative    41. Drechslera spicifera (Curvularia)  Negative    42. Mucor plumbeus  Negative    43. Fusarium moniliforme  Negative    44. Aureobasidium pullulans (pullulara)  Negative    45. Rhizopus oryzae  Negative    46. Botrytis cinera   Negative    47. Epicoccum nigrum  Negative    48. Phoma betae  Negative    49. Candida Albicans  Negative    50. Trichophyton mentagrophytes  Negative    51. Mite, D Farinae  5,000 AU/ml  4+    52. Mite, D Pteronyssinus  5,000 AU/ml  4+    53. Cat Hair 10,000 BAU/ml  2+    54.  Dog Epithelia  Negative  55. Mixed Feathers  Negative    56. Horse Epithelia  Negative    57. Cockroach, German  Negative    58. Mouse  Negative    59. Tobacco Leaf  Negative     Food Perc - 12/19/18 1037    Time Antigen Placed  1037    Allergen Manufacturer  Lavella Hammock    Location  Back    Number of allergen test  10    Food  Select    1. Peanut  Negative    2. Soybean food  Negative    3. Wheat, whole  Negative    4. Sesame  Negative    5. Milk, cow  Negative    6. Egg White, chicken  Negative    7. Casein  Negative    8. Shellfish mix  Negative    9. Fish mix  Negative    10. Cashew  Negative     Intradermal - 12/19/18 1123    Time Antigen Placed  1123    Allergen Manufacturer  Lavella Hammock    Location  Arm    Number of Test  9    Intradermal  Select    Control  Negative    Ragweed mix  3+    Tree mix  Negative    Mold 1  Negative    Mold 2  Negative    Mold 3  Negative    Mold 4  Negative    Dog  Negative    Cockroach  Negative     Food Adult Perc - 12/19/18 1000    Time Antigen Placed  1037    Allergen Manufacturer  Greer    Location  Back    Number of allergen test  5    Panel 2  Select    Control-Histamine 1 mg/ml  2+    37. Pork  Negative    38. [place] Meat  Negative    40. Beef  Negative    53. Corn  Negative       Past Medical History: Patient Active Problem List   Diagnosis Date Noted  . Other allergic rhinitis 12/19/2018  . Heartburn 12/19/2018  . Other atopic dermatitis 12/19/2018  . Allergies 11/16/2018  . Suspicious nevus 05/14/2018  . Essential hypertension 05/14/2018  . Hyperlipidemia associated with type 2 diabetes mellitus ([place]) 05/14/2018  . Neck pain  12/07/2017  . DM (diabetes mellitus) type II uncontrolled, periph vascular disorder ([place]) 11/13/2017  . Hyperlipidemia LDL goal <70 11/13/2017  . Chronic pain of right knee 11/13/2017  . H/O seasonal allergies 11/13/2017  . Neuroma of foot 04/18/2016  . Arch pain of left foot 04/04/2016  . Loss of transverse plantar arch of left foot 04/04/2016  . Pain in joint, ankle and foot 03/16/2016  . Cough variant asthma 09/10/2015  . Numbness 11/07/2014  . Diabetes ([place]) 07/23/2013  . Infected sebaceous cyst 04/24/2012  . Hyperlipidemia 03/30/2012  . Hemorrhoid 03/05/2011  . Chronic bronchitis 03/04/2011  . Abscess 03/04/2011  . Herpes zoster ophthalmicus 03/04/2011   Past Medical History:  Diagnosis Date  . Allergy   . Anemia   . Asthma    pt states no-no inhalers  . COPD (chronic obstructive pulmonary disease) ([place])   . Diabetes mellitus   . Diabetes mellitus without complication ([place])   . Glaucoma   . Herpes    Right eye  . Herpes simplex of eye   . Hyperlipidemia   . OCD (obsessive compulsive disorder)  Past Surgical History: Past Surgical History:  Procedure Laterality Date  . MASS EXCISION  06/22/2012   Procedure: EXCISION MASS;  Surgeon: Harl Bowie, MD;  Location: [facility];  Service: General;  Laterality: Right;  Excision chronic Right axillary cyst  . NO PAST SURGERIES     Medication List:  Current Outpatient Medications  Medication Sig Dispense Refill  . aspirin (ASPIRIN EC) 81 MG EC tablet Take 1 tablet (81 mg total) by mouth daily. Swallow whole. 30 tablet 12  . atorvastatin (LIPITOR) 80 MG tablet Take 1 tablet (80 mg total) by mouth daily. 90 tablet 3  . azelastine (ASTELIN) 0.1 % nasal spray Place 2 sprays into both nostrils 2 (two) times daily. Use in each nostril as directed 30 mL 11  . blood glucose meter kit and supplies KIT Use a directed twice a day.  E11.8.  Use Relion Meter 1 each 0  . blood glucose meter kit and supplies KIT  Dispense based on patient and insurance preference. Use up to four times daily as directed. (FOR ICD-9 250.00, 250.01). 1 each 0  . brimonidine (ALPHAGAN) 0.2 % ophthalmic solution   98  . brimonidine-timolol (COMBIGAN) 0.2-0.5 % ophthalmic solution Place 1 drop into the right eye every 12 (twelve) hours. Reported on 03/16/2016    . ezetimibe (ZETIA) 10 MG tablet Take 1 tablet (10 mg total) by mouth daily. 90 tablet 3  . famciclovir (FAMVIR) 500 MG tablet Take 1 tablet (500 mg total) by mouth 3 (three) times daily. 21 tablet 0  . fluticasone (FLONASE) 50 MCG/ACT nasal spray Place 2 sprays into both nostrils daily. 16 g 11  . glimepiride (AMARYL) 1 MG tablet TAKE ONE-HALF TABLET BY MOUTH DAILY WITH BREAKFAST 45 tablet 1  . glucose blood (RELION GLUCOSE TEST STRIPS) test strip Use as instructed 100 each 5  . levocetirizine (XYZAL) 5 MG tablet TAKE ONE TABLET BY MOUTH EVERY EVENING 90 tablet 3  . meloxicam (MOBIC) 15 MG tablet Take 1 tablet (15 mg total) by mouth daily. 30 tablet 0  . metFORMIN (GLUCOPHAGE-XR) 500 MG 24 hr tablet TAKE TWO TABLETS BY MOUTH DAILY 180 tablet 3  . montelukast (SINGULAIR) 10 MG tablet Take 1 tablet (10 mg total) by mouth at bedtime. 90 tablet 3  . NONFORMULARY OR COMPOUNDED ITEM Alcohol pads  #1 box  Use as directed 1 each 5  . omeprazole (PRILOSEC) 40 MG capsule TAKE ONE CAPSULE BY MOUTH DAILY 30 capsule 4  . RELION LANCETS THIN 26G MISC Use as directed twice a day. E11.8 100 each 5  . valACYclovir (VALTREX) 1000 MG tablet Take 1,000 mg by mouth daily.    Stasia Cavalier (EUCRISA) 2 % OINT Apply 1 application topically 2 (two) times daily. 60 g 5  . famotidine (PEPCID) 20 MG tablet Take 1 tablet (20 mg total) by mouth daily. 90 tablet 1   No current facility-administered medications for this visit.    Allergies: No Known Allergies Social History: Social History   Socioeconomic History  . Marital status: Married    Spouse name: Not on file  . Number of children:  Not on file  . Years of education: Not on file  . Highest education level: Not on file  Occupational History  . Occupation: factory Government social research officer  . Financial resource strain: Not on file  . Food insecurity:    Worry: Not on file    Inability: Not on file  . Transportation needs:  Medical: Not on file    Non-medical: Not on file  Tobacco Use  . Smoking status: Never Smoker  . Smokeless tobacco: Never Used  Substance and Sexual Activity  . Alcohol use: No  . Drug use: No  . Sexual activity: Not Currently    Partners: Male  Lifestyle  . Physical activity:    Days per week: Not on file    Minutes per session: Not on file  . Stress: Not on file  Relationships  . Social connections:    Talks on phone: Not on file    Gets together: Not on file    Attends religious service: Not on file    Active member of club or organization: Not on file    Attends meetings of clubs or organizations: Not on file    Relationship status: Not on file  Other Topics Concern  . Not on file  Social History Narrative   ** Merged History Encounter **    Exercise-no   Lives in a 48 year old home. Smoking: denies Occupation: part time  Environmental History: Water Damage/mildew in the house: no Carpet in the family room: yes Carpet in the bedroom: yes Heating: electric Cooling: central Pet: no  Family History: Family History  Problem Relation Age of Onset  . Diabetes Father   . Hypertension Mother   . Diabetes Paternal Grandmother   . Cancer Other        LUNG, BRAIN AND STOMACH   . Cancer Cousin        ovarian   Problem                               Relation Asthma                                   No  Eczema                                No  Food allergy                          No  Allergic rhino conjunctivitis     No   Review of Systems  Constitutional: Negative for appetite change, chills, fever and unexpected weight change.  HENT: Positive for congestion, ear  pain and rhinorrhea.   Eyes: Positive for itching.  Respiratory: Negative for cough, chest tightness, shortness of breath and wheezing.   Cardiovascular: Negative for chest pain.  Gastrointestinal: Negative for abdominal pain.  Genitourinary: Negative for difficulty urinating.  Skin: Positive for rash.  Allergic/Immunologic: Positive for environmental allergies. Negative for food allergies.  Neurological: Negative for headaches.   Objective: BP 130/86   Pulse 87   Temp 98.3 F (36.8 C) (Oral)   Resp 16   Ht _0  (1.702 m)   Wt 166 lb (75.3 kg)   LMP  (LMP Unknown) Comment: patient could not remember last period 05/15/17  SpO2 97%   BMI 26.00 kg/m  Body mass index is 26 kg/m. Physical Exam  Constitutional: She is oriented to person, place, and time. She appears well-developed and well-nourished.  HENT:  Head: Normocephalic and atraumatic.  Right Ear: External ear normal.  Left Ear: External ear normal.  Nose: Nose normal.  Mouth/Throat: Oropharynx is clear  and moist.  Eyes: Conjunctivae and EOM are normal.  Neck: Neck supple.  Cardiovascular: Normal rate, regular rhythm and normal heart sounds. Exam reveals no gallop and no friction rub.  No murmur heard. Pulmonary/Chest: Effort normal and breath sounds normal. She has no wheezes. She has no rales.  Abdominal: Soft.  Lymphadenopathy:    She has no cervical adenopathy.  Neurological: She is alert and oriented to person, place, and time.  Skin: Skin is warm. Rash noted.  Mild erythema on the hands which was demarcated.   Psychiatric: She has a normal mood and affect. Her behavior is normal.  Nursing note and vitals reviewed.  The plan was reviewed with the patient/family, and all questions/concerned were addressed.  It was my pleasure to see Claire V today and participate in her care. Please feel free to contact me with any questions or concerns.  Sincerely,  Rexene Alberts, DO Allergy & Immunology  [facility] office: [phone] [facility] office: [phone]

## 2018-12-19 NOTE — Assessment & Plan Note (Signed)
Patient has some mild atopic dermatitis on her hands.  Discussed proper skin care.  Use eucrisa twice a day on the hands for 1 month and see if the rash/itching improves.

## 2018-12-19 NOTE — Assessment & Plan Note (Signed)
Perennial rhinoconjunctivitis symptoms for the past 2 years.  Used Astelin with some benefit.  Flonase did not work as well.  Does not recall previous ENT or allergy work-up.  Today's skin testing showed: Positive to grass pollen, weed, ragweed, dust mites and cat.  Discussed environmental control measures.  May use over the counter antihistamines such as Zyrtec (cetirizine), Claritin (loratadine), Allegra (fexofenadine), or Xyzal (levocetirizine) daily as needed.  Continue Astelin 1-2 sprays twice a day as needed for drainage.  Continue Flonase 2 sprays daily for nasal congestion.  Start Singulair 10mg  daily.  If above regimen does not control symptoms then will discuss allergy immunotherapy in more detail at next visit.

## 2018-12-19 NOTE — Assessment & Plan Note (Addendum)
Patient concerned about food allergies as she noticed clearing of her throat after eating [place] food such as tacos, [place], pork, or salad.  Sometimes she also has emesis episodes.  However she can eat these foods at times without any symptoms.  She does have heartburn and is on omeprazole currently.  Today's skin testing was negative to foods.  Discussed with patient and based on her symptoms they are more concerning for heartburn/reflux.  Continue omeprazole 40 mg daily and will add on Pepcid 20 mg daily.  Continue reflux lifestyle modification diet.  If symptoms persist then will recommend EGD.

## 2018-12-19 NOTE — Patient Instructions (Addendum)
Today's skin testing showed: Positive to grass pollen, weed, ragweed, dust mites and cat.  May use over the counter antihistamines such as Zyrtec (cetirizine), Claritin (loratadine), Allegra (fexofenadine), or Xyzal (levocetirizine) daily as needed.  Continue Astelin 1-2 sprays twice a day as needed for drainage. Continue Flonase 2 sprays daily for nasal congestion. Start singulair 10mg  daily.  You may have reflux which is causing the symptoms after you eat.  Take pepcid 20mg  once a day and monitor symptoms.  Monitor symptoms.  Use eucrisa twice a day on the hands for 1 month and see if the rash/itching improves.   Follow up in 2 months.   Reducing Pollen Exposure . Pollen seasons: trees (spring), grass (summer) and ragweed/weeds (fall). Marland Kitchen Keep windows closed in your home and car to lower pollen exposure.  Susa Simmonds air conditioning in the bedroom and throughout the house if possible.  . Avoid going out in dry windy days - especially early morning. . Pollen counts are highest between 5 - 10 AM and on dry, hot and windy days.  . Save outside activities for late afternoon or after a heavy rain, when pollen levels are lower.  . Avoid mowing of grass if you have grass pollen allergy. Marland Kitchen Be aware that pollen can also be transported indoors on people and pets.  . Dry your clothes in an automatic dryer rather than hanging them outside where they might collect pollen.  . Rinse hair and eyes before bedtime. Control of House Dust Mite Allergen . Dust mite allergens are a common trigger of allergy and asthma symptoms. While they can be found throughout the house, these microscopic creatures thrive in warm, humid environments such as bedding, upholstered furniture and carpeting. . Because so much time is spent in the bedroom, it is essential to reduce mite levels there.  . Encase pillows, mattresses, and box springs in special allergen-proof fabric covers or airtight, zippered plastic covers.   . Bedding should be washed weekly in hot water (130 F) and dried in a hot dryer. Allergen-proof covers are available for comforters and pillows that can't be regularly washed.  Wendee Copp the allergy-proof covers every few months. Minimize clutter in the bedroom. Keep pets out of the bedroom.  Marland Kitchen Keep humidity less than 50% by using a dehumidifier or air conditioning. You can buy a humidity measuring device called a hygrometer to monitor this.  . If possible, replace carpets with hardwood, linoleum, or washable area rugs. If that's not possible, vacuum frequently with a vacuum that has a HEPA filter. . Remove all upholstered furniture and non-washable window drapes from the bedroom. . Remove all non-washable stuffed toys from the bedroom.  Wash stuffed toys weekly. Pet Allergen Avoidance: . Contrary to popular opinion, there are no "hypoallergenic" breeds of dogs or cats. That is because people are not allergic to an animal's hair, but to an allergen found in the animal's saliva, dander (dead skin flakes) or urine. Pet allergy symptoms typically occur within minutes. For some people, symptoms can build up and become most severe 8 to 12 hours after contact with the animal. People with severe allergies can experience reactions in public places if dander has been transported on the pet owners' clothing. Marland Kitchen Keeping an animal outdoors is only a partial solution, since homes with pets in the yard still have higher concentrations of animal allergens. . Before getting a pet, ask your allergist to determine if you are allergic to animals. If your pet is already considered part  of your family, try to minimize contact and keep the pet out of the bedroom and other rooms where you spend a great deal of time. . As with dust mites, vacuum carpets often or replace carpet with a hardwood floor, tile or linoleum. . High-efficiency particulate air (HEPA) cleaners can reduce allergen levels over time. . While dander and saliva  are the source of cat and dog allergens, urine is the source of allergens from rabbits, hamsters, mice and [place] pigs; so ask a non-allergic family member to clean the animal's cage. . If you have a pet allergy, talk to your allergist about the potential for allergy immunotherapy (allergy shots). This strategy can often provide long-term relief.  Skin care recommendations  Bath time: . Always use lukewarm water. AVOID very hot or cold water. Marland Kitchen Keep bathing time to 5-10 minutes. . Do NOT use bubble bath. . Use a mild soap and use just enough to wash the dirty areas. . Do NOT scrub skin vigorously.  . After bathing, pat dry your skin with a towel. Do NOT rub or scrub the skin.  Moisturizers and prescriptions:  . ALWAYS apply moisturizers immediately after bathing (within 3 minutes). This helps to lock-in moisture. . Use the moisturizer several times a day over the whole body. Kermit Balo summer moisturizers include: Aveeno, CeraVe, Cetaphil. Kermit Balo winter moisturizers include: Aquaphor, Vaseline, Cerave, Cetaphil, Eucerin, Vanicream. . When using moisturizers along with medications, the moisturizer should be applied about one hour after applying the medication to prevent diluting effect of the medication or moisturize around where you applied the medications. When not using medications, the moisturizer can be continued twice daily as maintenance.  Laundry and clothing: . Avoid laundry products with added color or perfumes. . Use unscented hypo-allergenic laundry products such as Tide free, Cheer free & gentle, and All free and clear.  . If the skin still seems dry or sensitive, you can try double-rinsing the clothes. . Avoid tight or scratchy clothing such as wool. . Do not use fabric softeners or dyer sheets.

## 2019-01-02 ENCOUNTER — Other Ambulatory Visit: Payer: Self-pay | Admitting: Family Medicine

## 2019-02-18 ENCOUNTER — Other Ambulatory Visit: Payer: Self-pay

## 2019-02-18 ENCOUNTER — Telehealth: Payer: Self-pay

## 2019-02-18 ENCOUNTER — Encounter: Payer: Self-pay | Admitting: Allergy

## 2019-02-18 ENCOUNTER — Ambulatory Visit (INDEPENDENT_AMBULATORY_CARE_PROVIDER_SITE_OTHER): Payer: BLUE CROSS/BLUE SHIELD | Admitting: Allergy

## 2019-02-18 VITALS — BP 110/74 | HR 78 | Temp 97.9°F | Resp 16 | Ht 66.34 in | Wt 170.2 lb

## 2019-02-18 DIAGNOSIS — R12 Heartburn: Secondary | ICD-10-CM | POA: Diagnosis not present

## 2019-02-18 DIAGNOSIS — J3089 Other allergic rhinitis: Secondary | ICD-10-CM

## 2019-02-18 DIAGNOSIS — H1010 Acute atopic conjunctivitis, unspecified eye: Secondary | ICD-10-CM

## 2019-02-18 DIAGNOSIS — L2089 Other atopic dermatitis: Secondary | ICD-10-CM | POA: Diagnosis not present

## 2019-02-18 DIAGNOSIS — J302 Other seasonal allergic rhinitis: Secondary | ICD-10-CM

## 2019-02-18 MED ORDER — LEVOCETIRIZINE DIHYDROCHLORIDE 5 MG PO TABS: 5.0000 mg | ORAL_TABLET | Freq: Every evening | ORAL | 5 refills | Status: DC

## 2019-02-18 MED ORDER — AZELASTINE HCL 0.1 % NA SOLN: NASAL | 5 refills | Status: DC

## 2019-02-18 MED ORDER — OMEPRAZOLE 40 MG PO CPDR: 40.0000 mg | DELAYED_RELEASE_CAPSULE | Freq: Every day | ORAL | 5 refills | Status: DC

## 2019-02-18 MED ORDER — FAMOTIDINE 20 MG PO TABS: 20.0000 mg | ORAL_TABLET | Freq: Two times a day (BID) | ORAL | 5 refills | Status: DC

## 2019-02-18 MED ORDER — CRISABOROLE 2 % EX OINT: 1.0000 "application " | TOPICAL_OINTMENT | Freq: Two times a day (BID) | CUTANEOUS | 5 refills | Status: DC

## 2019-02-18 MED ORDER — HYDROXYZINE HCL 25 MG PO TABS: ORAL_TABLET | ORAL | 5 refills | Status: DC

## 2019-02-18 MED ORDER — OLOPATADINE HCL 0.1 % OP SOLN: 1.0000 [drp] | Freq: Two times a day (BID) | OPHTHALMIC | 5 refills | Status: DC | PRN

## 2019-02-18 MED ORDER — EPINEPHRINE 0.3 MG/0.3ML IJ SOAJ: 0.3000 mg | Freq: Once | INTRAMUSCULAR | 2 refills | Status: AC

## 2019-02-18 MED ORDER — MONTELUKAST SODIUM 10 MG PO TABS: 10.0000 mg | ORAL_TABLET | Freq: Every day | ORAL | 5 refills | Status: DC

## 2019-02-18 MED ORDER — FLUTICASONE PROPIONATE 50 MCG/ACT NA SUSP: NASAL | 5 refills | Status: DC

## 2019-02-18 NOTE — Telephone Encounter (Signed)
Call to patient to make her aware that Georga Hacking was not covered by her insurance.  Explained to patient that it would be coming from a [facility] in [place] and they would be calling her to set up delivery.  Patient verbalized understanding and the call ended.

## 2019-02-18 NOTE — Telephone Encounter (Signed)
Call to patient to make her aware that her pepcid is not covered by the insurance and that she would need to buy it over the counter.  Pt verbalized understanding and call ended.

## 2019-02-18 NOTE — Assessment & Plan Note (Signed)
Past history - perennial rhinoconjunctivitis symptoms for the past 2 years.  Used Astelin with some benefit.  Flonase did not work as well.  Does not recall previous ENT work-up. 2020 skin testing showed: Positive to grass pollen, weed, ragweed, dust mites and cat.  Interim history - symptoms unchanged.  Continue environmental control measures.  May use over the counter antihistamines such as Zyrtec (cetirizine), Claritin (loratadine), Allegra (fexofenadine), or Xyzal (levocetirizine) daily as needed.  Continue Astelin 1-2 sprays twice a day as needed for drainage.  Continue Flonase 2 sprays daily for nasal congestion.  Continue Singulair 10mg  daily.  May use Patanol 1 drop in each eye twice daily as needed for itchy/watery eyes.   Start allergy injections.  Call us when ready to start after you check with your insurance.  Had a detailed discussion with patient/family that clinical history is suggestive of allergic rhinitis, and may benefit from allergy immunotherapy (AIT). Discussed in detail regarding the dosing, schedule, side effects (mild to moderate local allergic reaction and rarely systemic allergic reactions including anaphylaxis), and benefits (significant improvement in nasal symptoms, seasonal flares of asthma) of immunotherapy with the patient. There is significant time commitment involved with allergy shots, which includes weekly immunotherapy injections for first 9-12 months and then biweekly to monthly injections for 3-5 years. Consent was signed.

## 2019-02-18 NOTE — Assessment & Plan Note (Signed)
Eczema on hands improved but now itchy on the body.  Continue proper skin care.  Use eucrisa twice a day on eczema flares as needed.   May use hydroxyzine 25mg  1 hour before bedtime as needed for itching.

## 2019-02-18 NOTE — Assessment & Plan Note (Signed)
Past history - Patient concerned about food allergies as she noticed clearing of her throat after eating [place] food such as tacos, [place], pork, or salad.  Sometimes she also has emesis episodes.  However she can eat these foods at times without any symptoms.  She does have heartburn and is on omeprazole currently. 2020 skin testing was negative to foods. Interim history - improved.   Continue omeprazole 40 mg daily and Pepcid 20 mg daily.  Continue reflux lifestyle modification diet.

## 2019-02-18 NOTE — Patient Instructions (Addendum)
Other allergic rhinitis  Skin testing in the past was positive to grass pollen, weed, ragweed, dust mites and cat.  Discussed environmental control measures.  May use over the counter antihistamines such as Zyrtec (cetirizine), Claritin (loratadine), Allegra (fexofenadine), or Xyzal (levocetirizine) daily as needed.  Continue Astelin 1-2 sprays twice a day as needed for drainage.  Continue Flonase 2 sprays daily for nasal congestion.  Continue Singulair 10mg  daily.  May use Patanol 1 drop in each eye twice daily as needed for itchy/watery eyes.   Start allergy injections.  Call us when ready to start after you check with your insurance.  Had a detailed discussion with patient/family that clinical history is suggestive of allergic rhinitis, and may benefit from allergy immunotherapy (AIT). Discussed in detail regarding the dosing, schedule, side effects (mild to moderate local allergic reaction and rarely systemic allergic reactions including anaphylaxis), and benefits (significant improvement in nasal symptoms, seasonal flares of asthma) of immunotherapy with the patient. There is significant time commitment involved with allergy shots, which includes weekly immunotherapy injections for first 9-12 months and then biweekly to monthly injections for 3-5 years.   Heartburn  Continue omeprazole 40 mg daily and Pepcid 20 mg daily.  Continue reflux lifestyle modification diet.  Other atopic dermatitis  Continue proper skin care.  Use eucrisa twice a day on the hands for 1 month and see if the rash/itching improves.   May use hydroxyzine 25mg  1 hour before bedtime as needed for itching.    Follow up in 3 months  Skin care recommendations  Bath time: . Always use lukewarm water. AVOID very hot or cold water. Marland Kitchen Keep bathing time to 5-10 minutes. . Do NOT use bubble bath. . Use a mild soap and use just enough to wash the dirty areas. . Do NOT scrub skin vigorously.  . After  bathing, pat dry your skin with a towel. Do NOT rub or scrub the skin.  Moisturizers and prescriptions:  . ALWAYS apply moisturizers immediately after bathing (within 3 minutes). This helps to lock-in moisture. . Use the moisturizer several times a day over the whole body. Kermit Balo summer moisturizers include: Aveeno, CeraVe, Cetaphil. Kermit Balo winter moisturizers include: Aquaphor, Vaseline, Cerave, Cetaphil, Eucerin, Vanicream. . When using moisturizers along with medications, the moisturizer should be applied about one hour after applying the medication to prevent diluting effect of the medication or moisturize around where you applied the medications. When not using medications, the moisturizer can be continued twice daily as maintenance.  Laundry and clothing: . Avoid laundry products with added color or perfumes. . Use unscented hypo-allergenic laundry products such as Tide free, Cheer free & gentle, and All free and clear.  . If the skin still seems dry or sensitive, you can try double-rinsing the clothes. . Avoid tight or scratchy clothing such as wool. . Do not use fabric softeners or dyer sheets.  Reducing Pollen Exposure . Pollen seasons: trees (spring), grass (summer) and ragweed/weeds (fall). Marland Kitchen Keep windows closed in your home and car to lower pollen exposure.  Susa Simmonds air conditioning in the bedroom and throughout the house if possible.  . Avoid going out in dry windy days - especially early morning. . Pollen counts are highest between 5 - 10 AM and on dry, hot and windy days.  . Save outside activities for late afternoon or after a heavy rain, when pollen levels are lower.  . Avoid mowing of grass if you have grass pollen allergy. Marland Kitchen Be aware  that pollen can also be transported indoors on people and pets.  . Dry your clothes in an automatic dryer rather than hanging them outside where they might collect pollen.  . Rinse hair and eyes before bedtime. Control of House Dust Mite  Allergen . Dust mite allergens are a common trigger of allergy and asthma symptoms. While they can be found throughout the house, these microscopic creatures thrive in warm, humid environments such as bedding, upholstered furniture and carpeting. . Because so much time is spent in the bedroom, it is essential to reduce mite levels there.  . Encase pillows, mattresses, and box springs in special allergen-proof fabric covers or airtight, zippered plastic covers.  . Bedding should be washed weekly in hot water (130 F) and dried in a hot dryer. Allergen-proof covers are available for comforters and pillows that can't be regularly washed.  Wendee Copp the allergy-proof covers every few months. Minimize clutter in the bedroom. Keep pets out of the bedroom.  Marland Kitchen Keep humidity less than 50% by using a dehumidifier or air conditioning. You can buy a humidity measuring device called a hygrometer to monitor this.  . If possible, replace carpets with hardwood, linoleum, or washable area rugs. If that's not possible, vacuum frequently with a vacuum that has a HEPA filter. . Remove all upholstered furniture and non-washable window drapes from the bedroom. . Remove all non-washable stuffed toys from the bedroom.  Wash stuffed toys weekly. Pet Allergen Avoidance: . Contrary to popular opinion, there are no "hypoallergenic" breeds of dogs or cats. That is because people are not allergic to an animal's hair, but to an allergen found in the animal's saliva, dander (dead skin flakes) or urine. Pet allergy symptoms typically occur within minutes. For some people, symptoms can build up and become most severe 8 to 12 hours after contact with the animal. People with severe allergies can experience reactions in public places if dander has been transported on the pet owners' clothing. Marland Kitchen Keeping an animal outdoors is only a partial solution, since homes with pets in the yard still have higher concentrations of animal  allergens. . Before getting a pet, ask your allergist to determine if you are allergic to animals. If your pet is already considered part of your family, try to minimize contact and keep the pet out of the bedroom and other rooms where you spend a great deal of time. . As with dust mites, vacuum carpets often or replace carpet with a hardwood floor, tile or linoleum. . High-efficiency particulate air (HEPA) cleaners can reduce allergen levels over time. . While dander and saliva are the source of cat and dog allergens, urine is the source of allergens from rabbits, hamsters, mice and [place] pigs; so ask a non-allergic family member to clean the animal's cage. . If you have a pet allergy, talk to your allergist about the potential for allergy immunotherapy (allergy shots). This strategy can often provide long-term relief.

## 2019-02-18 NOTE — Progress Notes (Signed)
Follow Up Note  RE: Claire Hill MRN: [id number] DOB: 23-Apr-1971 Date of Office Visit: 02/18/2019  Referring provider: Carollee Herter, Alferd Apa, * Primary care provider: Carollee Herter, Alferd Apa, DO  Chief Complaint: Pruritis (back, shoulder, hands, legs) and Eczema  History of Present Illness: I had the pleasure of seeing Claire Hill for a follow up visit at the [facility] on 02/18/2019. She is a 48 y.o. female, who is being followed for allergic rhinitis, heartburn, atopic dermatitis. Today she is here for regular follow up visit. Her previous allergy office visit was on 12/19/2018 with Dr. Maudie Mercury. Patient declines using Spanish interpreter via phone.   Pruritus/atopic dermatitis: Paint having issues with itching on the back and torso without any rash. Mainly around the bra line and the side.  Using eucrisa cream daily at night with some benefit.   Other allergic rhinitis Itchy eyes/nose daily. Currently on Singulair 87m daily with some benefit. Only using nasal spray as needed in the morning with some benefit. Interested in starting allergy injections.  Heartburn Controlled with omeprazole 40 mg daily and Pepcid 20 mg daily.  Assessment and Plan: Claire Hill is a 48y.o. female with: Seasonal and perennial allergic rhinoconjunctivitis Past history - perennial rhinoconjunctivitis symptoms for the past 2 years.  Used Astelin with some benefit.  Flonase did not work as well.  Does not recall previous ENT work-up. 2020 skin testing showed: Positive to grass pollen, weed, ragweed, dust mites and cat.  Interim history - symptoms unchanged.  Continue environmental control measures.  May use over the counter antihistamines such as Zyrtec (cetirizine), Claritin (loratadine), Allegra (fexofenadine), or Xyzal (levocetirizine) daily as needed.  Continue Astelin 1-2 sprays twice a day as needed for drainage.  Continue Flonase 2 sprays daily for nasal  congestion.  Continue Singulair 178mdaily.  May use Patanol 1 drop in each eye twice daily as needed for itchy/watery eyes.   Start allergy injections.  [place] ready to start after you check with your insurance.  Had a detailed discussion with patient/family that clinical history is suggestive of allergic rhinitis, and may benefit from allergy immunotherapy (AIT). Discussed in detail regarding the dosing, schedule, side effects (mild to moderate local allergic reaction and rarely systemic allergic reactions including anaphylaxis), and benefits (significant improvement in nasal symptoms, seasonal flares of asthma) of immunotherapy with the patient. There is significant time commitment involved with allergy shots, which includes weekly immunotherapy injections for first 9-12 months and then biweekly to monthly injections for 3-5 years. Consent was signed.   Other atopic dermatitis Eczema on hands improved but now itchy on the body.  Continue proper skin care.  Use eucrisa twice a day on eczema flares as needed.   May use hydroxyzine 2540m hour before bedtime as needed for itching.    Heartburn Past history - Patient concerned about food allergies as she noticed clearing of her throat after eating [place] such as tacos, [place], or salad.  Sometimes she also has emesis episodes.  However she can eat these foods at times without any symptoms.  She does have heartburn and is on omeprazole currently. 2020 skin testing was negative to foods. Interim history - improved.   Continue omeprazole 40 mg daily and Pepcid 20 mg daily.  Continue reflux lifestyle modification diet.  Return in about 3 months (around 05/20/2019).  Meds ordered this encounter  Medications  . azelastine (ASTELIN) 0.1 % nasal spray  Sig: Take 1-2 sprays twice a day as needed for runny nose.    Dispense:  30 mL    Refill:  5  . fluticasone (FLONASE) 50 MCG/ACT nasal spray    Sig: Take 1-2 sprays  daily for nasal congestion.    Dispense:  16 g    Refill:  5  . olopatadine (PATANOL) 0.1 % ophthalmic solution    Sig: Place 1 drop into both eyes 2 (two) times daily as needed for allergies.    Dispense:  5 mL    Refill:  5  . hydrOXYzine (ATARAX/VISTARIL) 25 MG tablet    Sig: Take 1 tablet 1 hour before bedtime as needed for itching.    Dispense:  30 tablet    Refill:  5  . montelukast (SINGULAIR) 10 MG tablet    Sig: Take 1 tablet (10 mg total) by mouth at bedtime.    Dispense:  30 tablet    Refill:  5  . omeprazole (PRILOSEC) 40 MG capsule    Sig: Take 1 capsule (40 mg total) by mouth daily.    Dispense:  30 capsule    Refill:  5  . famotidine (PEPCID) 20 MG tablet    Sig: Take 1 tablet (20 mg total) by mouth 2 (two) times daily.    Dispense:  60 tablet    Refill:  5  . EPINEPHrine (AUVI-Q) 0.3 mg/0.3 mL IJ SOAJ injection    Sig: Inject 0.3 mLs (0.3 mg total) into the muscle once for 1 dose. As directed for life-threatening allergic reactions    Dispense:  4 Device    Refill:  2  . levocetirizine (XYZAL) 5 MG tablet    Sig: Take 1 tablet (5 mg total) by mouth every evening.    Dispense:  30 tablet    Refill:  5  . Crisaborole (EUCRISA) 2 % OINT    Sig: Apply 1 application topically 2 (two) times a day.    Dispense:  100 g    Refill:  5   Diagnostics: None.  Medication List:  Current Outpatient Medications  Medication Sig Dispense Refill  . aspirin (ASPIRIN EC) 81 MG EC tablet Take 1 tablet (81 mg total) by mouth daily. Swallow whole. 30 tablet 12  . atorvastatin (LIPITOR) 80 MG tablet Take 1 tablet (80 mg total) by mouth daily. 90 tablet 3  . azelastine (ASTELIN) 0.1 % nasal spray Take 1-2 sprays twice a day as needed for runny nose. 30 mL 5  . blood glucose meter kit and supplies KIT Use a directed twice a day.  E11.8.  Use Relion Meter 1 each 0  . blood glucose meter kit and supplies KIT Dispense based on patient and insurance preference. Use up to four times  daily as directed. (FOR ICD-9 250.00, 250.01). 1 each 0  . brimonidine (ALPHAGAN) 0.2 % ophthalmic solution   98  . brimonidine-timolol (COMBIGAN) 0.2-0.5 % ophthalmic solution Place 1 drop into the right eye every 12 (twelve) hours. Reported on 03/16/2016    . Crisaborole (EUCRISA) 2 % OINT Apply 1 application topically 2 (two) times daily. 60 g 5  . ezetimibe (ZETIA) 10 MG tablet Take 1 tablet (10 mg total) by mouth daily. 90 tablet 3  . famciclovir (FAMVIR) 500 MG tablet Take 1 tablet (500 mg total) by mouth 3 (three) times daily. 21 tablet 0  . famotidine (PEPCID) 20 MG tablet Take 1 tablet (20 mg total) by mouth daily. 90 tablet 1  . fluticasone (  FLONASE) 50 MCG/ACT nasal spray Take 1-2 sprays daily for nasal congestion. 16 g 5  . glimepiride (AMARYL) 1 MG tablet TAKE ONE-HALF TABLET BY MOUTH DAILY WITH BREAKFAST 45 tablet 1  . glucose blood (RELION GLUCOSE TEST STRIPS) test strip Use as instructed 100 each 5  . levocetirizine (XYZAL) 5 MG tablet TAKE ONE TABLET BY MOUTH EVERY EVENING 90 tablet 3  . meloxicam (MOBIC) 15 MG tablet Take 1 tablet (15 mg total) by mouth daily. 30 tablet 0  . metFORMIN (GLUCOPHAGE-XR) 500 MG 24 hr tablet TAKE TWO TABLETS BY MOUTH DAILY 180 tablet 1  . montelukast (SINGULAIR) 10 MG tablet Take 1 tablet (10 mg total) by mouth at bedtime. 90 tablet 3  . NONFORMULARY OR COMPOUNDED ITEM Alcohol pads  #1 box  Use as directed 1 each 5  . RELION LANCETS THIN 26G MISC Use as directed twice a day. E11.8 100 each 5  . Crisaborole (EUCRISA) 2 % OINT Apply 1 application topically 2 (two) times a day. 100 g 5  . EPINEPHrine (AUVI-Q) 0.3 mg/0.3 mL IJ SOAJ injection Inject 0.3 mLs (0.3 mg total) into the muscle once for 1 dose. As directed for life-threatening allergic reactions 4 Device 2  . famotidine (PEPCID) 20 MG tablet Take 1 tablet (20 mg total) by mouth 2 (two) times daily. 60 tablet 5  . hydrOXYzine (ATARAX/VISTARIL) 25 MG tablet Take 1 tablet 1 hour before bedtime as  needed for itching. 30 tablet 5  . levocetirizine (XYZAL) 5 MG tablet Take 1 tablet (5 mg total) by mouth every evening. 30 tablet 5  . montelukast (SINGULAIR) 10 MG tablet Take 1 tablet (10 mg total) by mouth at bedtime. 30 tablet 5  . olopatadine (PATANOL) 0.1 % ophthalmic solution Place 1 drop into both eyes 2 (two) times daily as needed for allergies. 5 mL 5  . omeprazole (PRILOSEC) 40 MG capsule TAKE ONE CAPSULE BY MOUTH DAILY (Patient not taking: Reported on 02/18/2019) 30 capsule 4  . omeprazole (PRILOSEC) 40 MG capsule Take 1 capsule (40 mg total) by mouth daily. 30 capsule 5   No current facility-administered medications for this visit.    Allergies: No Known Allergies I reviewed her past medical history, social history, family history, and environmental history and no significant changes have been reported from previous visit on 12/19/2018.  Review of Systems  Constitutional: Negative for appetite change, chills, fever and unexpected weight change.  HENT: Positive for congestion, rhinorrhea and sneezing.   Eyes: Positive for itching.  Respiratory: Negative for cough, chest tightness, shortness of breath and wheezing.   Cardiovascular: Negative for chest pain.  Gastrointestinal: Negative for abdominal pain.  Genitourinary: Negative for difficulty urinating.  Skin: Positive for rash.  Allergic/Immunologic: Positive for environmental allergies. Negative for food allergies.  Neurological: Negative for headaches.   Objective: BP 110/74 (BP Location: Left Arm, Patient Position: Sitting, Cuff Size: Normal)   Pulse 78   Temp 97.9 F (36.6 C) (Oral)   Resp 16   Ht 5' 6.34" (1.685 m)   Wt 170 lb 3.2 oz (77.2 kg)   LMP  (LMP Unknown) Comment: patient could not remember last period 05/15/17  SpO2 96%   BMI 27.19 kg/m  Body mass index is 27.19 kg/m. Physical Exam  Constitutional: She is oriented to person, place, and time. She appears well-developed and well-nourished.  HENT:   Head: Normocephalic and atraumatic.  Right Ear: External ear normal.  Left Ear: External ear normal.  Nose: Nose normal.  Mouth/Throat:  Oropharynx is clear and moist.  Eyes: Conjunctivae and EOM are normal.  Neck: Neck supple.  Cardiovascular: Normal rate, regular rhythm and normal heart sounds. Exam reveals no gallop and no friction rub.  No murmur heard. Pulmonary/Chest: Effort normal and breath sounds normal. She has no wheezes. She has no rales.  Abdominal: Soft.  Neurological: She is alert and oriented to person, place, and time.  Skin: Skin is warm. No rash noted.  Psychiatric: She has a normal mood and affect. Her behavior is normal.  Nursing note and vitals reviewed.  Previous notes and tests were reviewed. The plan was reviewed with the patient/family, and all questions/concerned were addressed.  It was my pleasure to see Remmie today and participate in her care. Please feel free to contact me with any questions or concerns.  Sincerely,  Rexene Alberts, DO Allergy & Immunology  [facility] office: [phone] [facility] office: [phone]

## 2019-02-20 ENCOUNTER — Telehealth: Payer: Self-pay

## 2019-02-20 NOTE — Telephone Encounter (Signed)
Prior authorization requested for [place]. This has been submitted via covermymeds. Awaiting approval/denial notification from insurance.

## 2019-02-21 NOTE — Telephone Encounter (Signed)
Denied. Patient will still receive the Eucrisa at low cost from [facility].

## 2019-02-22 ENCOUNTER — Other Ambulatory Visit: Payer: Self-pay

## 2019-02-22 ENCOUNTER — Other Ambulatory Visit (INDEPENDENT_AMBULATORY_CARE_PROVIDER_SITE_OTHER): Payer: BLUE CROSS/BLUE SHIELD

## 2019-02-22 DIAGNOSIS — E1151 Type 2 diabetes mellitus with diabetic peripheral angiopathy without gangrene: Secondary | ICD-10-CM

## 2019-02-22 DIAGNOSIS — IMO0002 Reserved for concepts with insufficient information to code with codable children: Secondary | ICD-10-CM

## 2019-02-22 DIAGNOSIS — E785 Hyperlipidemia, unspecified: Secondary | ICD-10-CM

## 2019-02-22 DIAGNOSIS — E1165 Type 2 diabetes mellitus with hyperglycemia: Secondary | ICD-10-CM

## 2019-02-22 DIAGNOSIS — E1169 Type 2 diabetes mellitus with other specified complication: Secondary | ICD-10-CM

## 2019-02-22 LAB — COMPREHENSIVE METABOLIC PANEL
ALT: 34 U/L (ref 0–35)
AST: 23 U/L (ref 0–37)
Albumin: 4.7 g/dL (ref 3.5–5.2)
Alkaline Phosphatase: 103 U/L (ref 39–117)
BUN: 14 mg/dL (ref 6–23)
CO2: 31 mEq/L (ref 19–32)
Calcium: 9.9 mg/dL (ref 8.4–10.5)
Chloride: 101 mEq/L (ref 96–112)
Creatinine, Ser: 0.69 mg/dL (ref 0.40–1.20)
GFR: 90.9 mL/min (ref >60.00)
Glucose, Bld: 160 mg/dL — ABNORMAL HIGH (ref 70–99)
Potassium: 4.1 mEq/L (ref 3.5–5.1)
Sodium: 139 mEq/L (ref 135–145)
Total Bilirubin: 0.6 mg/dL (ref 0.2–1.2)
Total Protein: 6.9 g/dL (ref 6.0–8.3)

## 2019-02-22 LAB — LDL CHOLESTEROL, DIRECT: Direct LDL: 144 mg/dL

## 2019-02-22 LAB — LIPID PANEL
Cholesterol: 280 mg/dL — ABNORMAL HIGH (ref 0–200)
HDL: 71.6 mg/dL (ref >39.00)
NonHDL: 208.54
Total CHOL/HDL Ratio: 4
Triglycerides: 276 mg/dL — ABNORMAL HIGH (ref 0.0–149.0)
VLDL: 55.2 mg/dL — ABNORMAL HIGH (ref 0.0–40.0)

## 2019-02-22 LAB — HEMOGLOBIN A1C: Hgb A1c MFr Bld: 8.2 % — ABNORMAL HIGH (ref 4.6–6.5)

## 2019-02-25 LAB — HM DIABETES EYE EXAM

## 2019-03-05 ENCOUNTER — Telehealth: Payer: Self-pay | Admitting: *Deleted

## 2019-03-05 MED ORDER — GLIMEPIRIDE 1 MG PO TABS: 1.0000 mg | ORAL_TABLET | Freq: Every day | ORAL | 1 refills | Status: DC

## 2019-03-05 NOTE — Telephone Encounter (Signed)
Received Diabetic Eye Exam Report from [facility]; forwarded to provider/SLS 05/12

## 2019-03-05 NOTE — Telephone Encounter (Signed)
Patient stated that she has been taking her cholesterol medications.  She was unsure about fasting at that appointment.

## 2019-03-05 NOTE — Telephone Encounter (Signed)
-----   Message from Ann Held, [place] sent at 02/28/2019  4:01 PM EDT ----- Is she taking her cholesterol meds?  Need to restart if not ---- unless there was a problem Dm not controlled -- increase amaryl to 1 tab a daiy Recheck 3 months with ov

## 2019-05-17 ENCOUNTER — Encounter: Payer: Self-pay | Admitting: Family Medicine

## 2019-05-17 ENCOUNTER — Other Ambulatory Visit (HOSPITAL_COMMUNITY)
Admission: RE | Admit: 2019-05-17 | Discharge: 2019-05-17 | Disposition: A | Discharge status: Home / Self Care | Payer: BC Managed Care – PPO | Source: Ambulatory Visit | Attending: Family Medicine | Admitting: Family Medicine

## 2019-05-17 ENCOUNTER — Ambulatory Visit (INDEPENDENT_AMBULATORY_CARE_PROVIDER_SITE_OTHER): Payer: BC Managed Care – PPO | Admitting: Family Medicine

## 2019-05-17 ENCOUNTER — Other Ambulatory Visit: Payer: Self-pay

## 2019-05-17 VITALS — BP 122/82 | HR 80 | Temp 98.6°F | Resp 18 | Ht 66.5 in | Wt 169.6 lb

## 2019-05-17 DIAGNOSIS — E785 Hyperlipidemia, unspecified: Secondary | ICD-10-CM

## 2019-05-17 DIAGNOSIS — IMO0002 Reserved for concepts with insufficient information to code with codable children: Secondary | ICD-10-CM

## 2019-05-17 DIAGNOSIS — E1169 Type 2 diabetes mellitus with other specified complication: Secondary | ICD-10-CM

## 2019-05-17 DIAGNOSIS — E1165 Type 2 diabetes mellitus with hyperglycemia: Secondary | ICD-10-CM

## 2019-05-17 DIAGNOSIS — I10 Essential (primary) hypertension: Secondary | ICD-10-CM | POA: Diagnosis not present

## 2019-05-17 DIAGNOSIS — E1151 Type 2 diabetes mellitus with diabetic peripheral angiopathy without gangrene: Secondary | ICD-10-CM

## 2019-05-17 DIAGNOSIS — R8290 Unspecified abnormal findings in urine: Secondary | ICD-10-CM | POA: Diagnosis not present

## 2019-05-17 DIAGNOSIS — G4489 Other headache syndrome: Secondary | ICD-10-CM

## 2019-05-17 LAB — POC URINALSYSI DIPSTICK (AUTOMATED)
Bilirubin, UA: NEGATIVE
Blood, UA: NEGATIVE
Glucose, UA: NEGATIVE
Ketones, UA: NEGATIVE
Leukocytes, UA: NEGATIVE
Nitrite, UA: NEGATIVE
Protein, UA: NEGATIVE
Spec Grav, UA: 1.015 (ref 1.010–1.025)
Urobilinogen, UA: 0.2 E.U./dL
pH, UA: 5.5 (ref 5.0–8.0)

## 2019-05-17 MED ORDER — CYCLOBENZAPRINE HCL 10 MG PO TABS: 10.0000 mg | ORAL_TABLET | Freq: Every day | ORAL | 0 refills | Status: DC

## 2019-05-17 NOTE — Patient Instructions (Signed)
Carbohydrate Counting for Diabetes Mellitus, Adult  Carbohydrate counting is a method of keeping track of how many carbohydrates you eat. Eating carbohydrates naturally increases the amount of sugar (glucose) in the blood. Counting how many carbohydrates you eat helps keep your blood glucose within normal limits, which helps you manage your diabetes (diabetes mellitus). It is important to know how many carbohydrates you can safely have in each meal. This is different for every person. A diet and nutrition specialist (registered dietitian) can help you make a meal plan and calculate how many carbohydrates you should have at each meal and snack. Carbohydrates are found in the following foods:  Grains, such as breads and cereals.  Dried beans and soy products.  Starchy vegetables, such as potatoes, peas, and corn.  Fruit and fruit juices.  Milk and yogurt.  Sweets and snack foods, such as cake, cookies, candy, chips, and soft drinks. How do I count carbohydrates? There are two ways to count carbohydrates in food. You can use either of the methods or a combination of both. Reading "Nutrition Facts" on packaged food The "Nutrition Facts" list is included on the labels of almost all packaged foods and beverages in the [place] It includes:  The serving size.  Information about nutrients in each serving, including the grams (g) of carbohydrate per serving. To use the "Nutrition Facts":  Decide how many servings you will have.  Multiply the number of servings by the number of carbohydrates per serving.  The resulting number is the total amount of carbohydrates that you will be having. Learning standard serving sizes of other foods When you eat carbohydrate foods that are not packaged or do not include "Nutrition Facts" on the label, you need to measure the servings in order to count the amount of carbohydrates:  Measure the foods that you will eat with a food scale or measuring cup, if needed.   Decide how many standard-size servings you will eat.  Multiply the number of servings by 15. Most carbohydrate-rich foods have about 15 g of carbohydrates per serving. ? For example, if you eat 8 oz (170 g) of strawberries, you will have eaten 2 servings and 30 g of carbohydrates (2 servings x 15 g = 30 g).  For foods that have more than one food mixed, such as soups and casseroles, you must count the carbohydrates in each food that is included. The following list contains standard serving sizes of common carbohydrate-rich foods. Each of these servings has about 15 g of carbohydrates:   hamburger bun or  English muffin.   oz (15 mL) syrup.   oz (14 g) jelly.  1 slice of bread.  1 six-inch tortilla.  3 oz (85 g) cooked rice or pasta.  4 oz (113 g) cooked dried beans.  4 oz (113 g) starchy vegetable, such as peas, corn, or potatoes.  4 oz (113 g) hot cereal.  4 oz (113 g) mashed potatoes or  of a large baked potato.  4 oz (113 g) canned or frozen fruit.  4 oz (120 mL) fruit juice.  4-6 crackers.  6 chicken nuggets.  6 oz (170 g) unsweetened dry cereal.  6 oz (170 g) plain fat-free yogurt or yogurt sweetened with artificial sweeteners.  8 oz (240 mL) milk.  8 oz (170 g) fresh fruit or one small piece of fruit.  24 oz (680 g) popped popcorn. Example of carbohydrate counting Sample meal  3 oz (85 g) chicken breast.  6 oz (170 g)   brown rice.  4 oz (113 g) corn.  8 oz (240 mL) milk.  8 oz (170 g) strawberries with sugar-free whipped topping. Carbohydrate calculation 1. Identify the foods that contain carbohydrates: ? Rice. ? Corn. ? Milk. ? Strawberries. 2. Calculate how many servings you have of each food: ? 2 servings rice. ? 1 serving corn. ? 1 serving milk. ? 1 serving strawberries. 3. Multiply each number of servings by 15 g: ? 2 servings rice x 15 g = 30 g. ? 1 serving corn x 15 g = 15 g. ? 1 serving milk x 15 g = 15 g. ? 1 serving  strawberries x 15 g = 15 g. 4. Add together all of the amounts to find the total grams of carbohydrates eaten: ? 30 g + 15 g + 15 g + 15 g = 75 g of carbohydrates total. Summary  Carbohydrate counting is a method of keeping track of how many carbohydrates you eat.  Eating carbohydrates naturally increases the amount of sugar (glucose) in the blood.  Counting how many carbohydrates you eat helps keep your blood glucose within normal limits, which helps you manage your diabetes.  A diet and nutrition specialist (registered dietitian) can help you make a meal plan and calculate how many carbohydrates you should have at each meal and snack. This information is not intended to replace advice given to you by your health care provider. Make sure you discuss any questions you have with your health care provider. Document Released: 10/10/2005 Document Revised: 05/04/2017 Document Reviewed: 03/23/2016 Elsevier Patient Education  2020 [employer]  

## 2019-05-17 NOTE — Assessment & Plan Note (Signed)
Well controlled, no changes to meds. Encouraged heart healthy diet such as the DASH diet and exercise as tolerated.  °

## 2019-05-17 NOTE — Assessment & Plan Note (Signed)
hgba1c to be done, minimize simple carbs. Increase exercise as tolerated. Continue current meds  
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
## 2019-05-17 NOTE — Assessment & Plan Note (Signed)
Tolerating statin, encouraged heart healthy diet, avoid trans fats, minimize simple carbs and saturated fats. Increase exercise as tolerated 

## 2019-05-18 LAB — LIPID PANEL
Cholesterol: 268 mg/dL — ABNORMAL HIGH (ref <200)
HDL: 68 mg/dL (ref >=50)
LDL Cholesterol (Calc): 161 mg/dL (calc) — ABNORMAL HIGH
Non-HDL Cholesterol (Calc): 200 mg/dL (calc) — ABNORMAL HIGH (ref <130)
Total CHOL/HDL Ratio: 3.9 (calc) (ref <5.0)
Triglycerides: 218 mg/dL — ABNORMAL HIGH (ref <150)

## 2019-05-18 LAB — HEMOGLOBIN A1C
Hgb A1c MFr Bld: 7.3 % of total Hgb — ABNORMAL HIGH (ref <5.7)
Mean Plasma Glucose: 163 (calc)
eAG (mmol/L): 9 (calc)

## 2019-05-18 LAB — URINE CYTOLOGY ANCILLARY ONLY
Chlamydia: NEGATIVE
Neisseria Gonorrhea: NEGATIVE
Trichomonas: NEGATIVE

## 2019-05-18 LAB — COMPREHENSIVE METABOLIC PANEL
AG Ratio: 2 (calc) (ref 1.0–2.5)
ALT: 60 U/L — ABNORMAL HIGH (ref 6–29)
AST: 44 U/L — ABNORMAL HIGH (ref 10–35)
Albumin: 4.5 g/dL (ref 3.6–5.1)
Alkaline phosphatase (APISO): 108 U/L (ref 31–125)
BUN: 12 mg/dL (ref 7–25)
CO2: 28 mmol/L (ref 20–32)
Calcium: 10.1 mg/dL (ref 8.6–10.2)
Chloride: 105 mmol/L (ref 98–110)
Creat: 0.67 mg/dL (ref 0.50–1.10)
Globulin: 2.2 g/dL (calc) (ref 1.9–3.7)
Glucose, Bld: 116 mg/dL — ABNORMAL HIGH (ref 65–99)
Potassium: 4.4 mmol/L (ref 3.5–5.3)
Sodium: 141 mmol/L (ref 135–146)
Total Bilirubin: 0.4 mg/dL (ref 0.2–1.2)
Total Protein: 6.7 g/dL (ref 6.1–8.1)

## 2019-05-20 ENCOUNTER — Other Ambulatory Visit: Payer: Self-pay

## 2019-05-20 ENCOUNTER — Encounter: Payer: Self-pay | Admitting: Allergy

## 2019-05-20 ENCOUNTER — Other Ambulatory Visit: Payer: Self-pay | Admitting: Family Medicine

## 2019-05-20 ENCOUNTER — Ambulatory Visit (INDEPENDENT_AMBULATORY_CARE_PROVIDER_SITE_OTHER): Payer: BC Managed Care – PPO | Admitting: Allergy

## 2019-05-20 VITALS — BP 130/80 | HR 96 | Temp 97.7°F | Resp 16 | Ht 65.5 in

## 2019-05-20 DIAGNOSIS — L2089 Other atopic dermatitis: Secondary | ICD-10-CM

## 2019-05-20 DIAGNOSIS — H1010 Acute atopic conjunctivitis, unspecified eye: Secondary | ICD-10-CM | POA: Diagnosis not present

## 2019-05-20 DIAGNOSIS — E1169 Type 2 diabetes mellitus with other specified complication: Secondary | ICD-10-CM

## 2019-05-20 DIAGNOSIS — E785 Hyperlipidemia, unspecified: Secondary | ICD-10-CM

## 2019-05-20 DIAGNOSIS — E1165 Type 2 diabetes mellitus with hyperglycemia: Secondary | ICD-10-CM

## 2019-05-20 DIAGNOSIS — R12 Heartburn: Secondary | ICD-10-CM

## 2019-05-20 DIAGNOSIS — J302 Other seasonal allergic rhinitis: Secondary | ICD-10-CM | POA: Diagnosis not present

## 2019-05-20 DIAGNOSIS — J3089 Other allergic rhinitis: Secondary | ICD-10-CM

## 2019-05-20 NOTE — Assessment & Plan Note (Signed)
Past history - Patient concerned about food allergies as she noticed clearing of her throat after eating [place] food such as tacos, [place], pork, or salad.  Sometimes she also has emesis episodes.  However she can eat these foods at times without any symptoms.  She does have heartburn and is on omeprazole currently. 2020 skin testing was negative to foods. Interim history - stable.    Continue omeprazole 40 mg daily and Pepcid 20 mg daily.  Continue reflux lifestyle modification diet.  Follow up with PCP regarding abdominal pain.

## 2019-05-20 NOTE — Progress Notes (Signed)
Follow Up Note  RE: Claire Hill MRN: [id number] DOB: January 11, 1971 Date of Office Visit: 05/20/2019  Referring provider: Ann Held, * Primary care provider: Carollee Herter, Alferd Apa, DO  Chief Complaint: Pruritis  History of Present Illness: I had the pleasure of seeing Claire Hill for a follow up visit at the [facility] on 05/20/2019. She is a 48 y.o. female, who is being followed for allergic rhinoconjunctivitis, atopic dermatitis and heartburn. Today she is here for regular follow up visit. Her previous allergy office visit was on 02/18/2019 with Dr. Maudie Mercury.   Patient declines using translator service.   Pruritus: Still itchy mainly on her shoulders and arms with no associated rash. Not moisturizing daily.   Taking Singulair 3m daily, xyzal 511mdaily, hydroxyzine 2542mrn with minimal benefit. Did not take famotidine. She had one episode of extreme itching and self injected herself with AuviQ with some benefit. She had no other symptoms and did not realized that the AuvBeryl Junctions not for itching.   Seasonal and perennial allergic rhinoconjunctivitis Using nasal spray with some benefit. Would like to start allergy injections.  Other atopic dermatitis Not moisturizing daily. Using eucrisa as needed.  Heartburn Complaining of some abdominal pains on and off. No triggers noted.  Taking omeprazole 40 mg dailyand Pepcid 20 mg daily.  Assessment and Plan: Claire Hill is a [age]o. female with: Seasonal and perennial allergic rhinoconjunctivitis Past history - perennial rhinoconjunctivitis symptoms for the past 2 years. Does not recall previous ENT work-up. 2020 skin testing showed: Positive to grass pollen, weed, ragweed, dust mites and cat.   Interim history - symptoms unchanged.  Continue environmental control measures.  Continue levoceterizine 5mg19mily and may take twice a day if needed.  Continue Astelin 1-2 sprays twice a day as  needed for drainage.  Continue Flonase 2 sprays daily for nasal congestion.  ContinueSingulair10mg14mly.  May use Patanol 1 drop in each eye twice daily as needed for itchy/watery eyes.   Recommend starting allergy injections. Patient will let us [place] when ready to start. Consent signed.   Other atopic dermatitis Pruritic torso. No associated rash. Not moisturizing daily. One episode of extreme whole body pruritus without any other symptoms and she used AuviQ.   Discussed with patient that AuviQWynona Lunaprescribed for her as she was supposed to start allergy immunotherapy. It is not to be used for pruritis only. She misunderstood why she was given the medicine and clarified the signs and symptoms of allergic reactions.   Continue proper skin care and moisturize daily.   Use eucrisa twice a day on eczema flares as needed.   Stop hydroxyzine as ineffective.   Heartburn Past history - Patient concerned about food allergies as she noticed clearing of her throat after eating [place] such as tacos, tur[place], or salad.  Sometimes she also has emesis episodes.  However she can eat these foods at times without any symptoms.  She does have heartburn and is on omeprazole currently. 2020 skin testing was negative to foods. Interim history - stable.    Continue omeprazole 40 mg daily and Pepcid 20 mg daily.  Continue reflux lifestyle modification diet.  Follow up with PCP regarding abdominal pain.   Return in about 3 months (around 08/20/2019).  Diagnostics: None.  Medication List:  Current Outpatient Medications  Medication Sig Dispense Refill  . aspirin (ASPIRIN EC) 81 MG EC tablet Take 1 tablet (81 mg total) by  mouth daily. Swallow whole. 30 tablet 12  . atorvastatin (LIPITOR) 80 MG tablet Take 1 tablet (80 mg total) by mouth daily. 90 tablet 3  . azelastine (ASTELIN) 0.1 % nasal spray Take 1-2 sprays twice a day as needed for runny nose. 30 mL 5  . blood glucose meter kit and  supplies KIT Use a directed twice a day.  E11.8.  Use Relion Meter 1 each 0  . blood glucose meter kit and supplies KIT Dispense based on patient and insurance preference. Use up to four times daily as directed. (FOR ICD-9 250.00, 250.01). 1 each 0  . brimonidine (ALPHAGAN) 0.2 % ophthalmic solution   98  . brimonidine-timolol (COMBIGAN) 0.2-0.5 % ophthalmic solution Place 1 drop into the right eye every 12 (twelve) hours. Reported on 03/16/2016    . Crisaborole (EUCRISA) 2 % OINT Apply 1 application topically 2 (two) times a day. 100 g 5  . cyclobenzaprine (FLEXERIL) 10 MG tablet Take 1 tablet (10 mg total) by mouth at bedtime. 30 tablet 0  . ezetimibe (ZETIA) 10 MG tablet Take 1 tablet (10 mg total) by mouth daily. 90 tablet 3  . famciclovir (FAMVIR) 500 MG tablet Take 1 tablet (500 mg total) by mouth 3 (three) times daily. 21 tablet 0  . fluticasone (FLONASE) 50 MCG/ACT nasal spray Take 1-2 sprays daily for nasal congestion. 16 g 5  . glimepiride (AMARYL) 1 MG tablet Take 1 tablet (1 mg total) by mouth daily with breakfast. 90 tablet 1  . glucose blood (RELION GLUCOSE TEST STRIPS) test strip Use as instructed 100 each 5  . latanoprost (XALATAN) 0.005 % ophthalmic solution     . levocetirizine (XYZAL) 5 MG tablet TAKE ONE TABLET BY MOUTH EVERY EVENING 90 tablet 3  . meloxicam (MOBIC) 15 MG tablet Take 1 tablet (15 mg total) by mouth daily. 30 tablet 0  . metFORMIN (GLUCOPHAGE-XR) 500 MG 24 hr tablet TAKE TWO TABLETS BY MOUTH DAILY 180 tablet 1  . montelukast (SINGULAIR) 10 MG tablet Take 1 tablet (10 mg total) by mouth at bedtime. 90 tablet 3  . montelukast (SINGULAIR) 10 MG tablet Take 1 tablet (10 mg total) by mouth at bedtime. 30 tablet 5  . NONFORMULARY OR COMPOUNDED ITEM Alcohol pads  #1 box  Use as directed 1 each 5  . olopatadine (PATANOL) 0.1 % ophthalmic solution Place 1 drop into both eyes 2 (two) times daily as needed for allergies. 5 mL 5  . omeprazole (PRILOSEC) 40 MG capsule TAKE  ONE CAPSULE BY MOUTH DAILY 30 capsule 4  . omeprazole (PRILOSEC) 40 MG capsule Take 1 capsule (40 mg total) by mouth daily. 30 capsule 5  . RELION LANCETS THIN 26G MISC Use as directed twice a day. E11.8 100 each 5  . famotidine (PEPCID) 20 MG tablet Take 1 tablet (20 mg total) by mouth 2 (two) times daily. (Patient not taking: Reported on 05/20/2019) 60 tablet 5   No current facility-administered medications for this visit.    Allergies: No Known Allergies I reviewed her past medical history, social history, family history, and environmental history and no significant changes have been reported from previous visit on 02/18/2019.  Review of Systems  Constitutional: Negative for appetite change, chills, fever and unexpected weight change.  HENT: Negative for congestion, rhinorrhea and sneezing.   Eyes: Negative for itching.  Respiratory: Negative for cough, chest tightness, shortness of breath and wheezing.   Cardiovascular: Negative for chest pain.  Gastrointestinal: Positive for abdominal pain.  Genitourinary: Negative for difficulty urinating.  Skin: Negative for rash.       Pruritus  Allergic/Immunologic: Positive for environmental allergies. Negative for food allergies.  Neurological: Negative for headaches.   Objective: BP 130/80   Pulse 96   Temp 97.7 F (36.5 C) (Temporal)   Resp 16   Ht 5' 5.5" (1.664 m)   LMP  (LMP Unknown) Comment: patient could not remember last period 05/15/17  SpO2 97%   BMI 27.79 kg/m  Body mass index is 27.79 kg/m. Physical Exam  Constitutional: She is oriented to person, place, and time. She appears well-developed and well-nourished.  HENT:  Head: Normocephalic and atraumatic.  Right Ear: External ear normal.  Left Ear: External ear normal.  Nose: Nose normal.  Mouth/Throat: Oropharynx is clear and moist.  Eyes: Conjunctivae and EOM are normal.  Neck: Neck supple.  Cardiovascular: Normal rate, regular rhythm and normal heart sounds. Exam  reveals no gallop and no friction rub.  No murmur heard. Pulmonary/Chest: Effort normal and breath sounds normal. She has no wheezes. She has no rales.  Abdominal: Soft.  Neurological: She is alert and oriented to person, place, and time.  Skin: Skin is warm. No rash noted.  Psychiatric: She has a normal mood and affect. Her behavior is normal.  Nursing note and vitals reviewed.  Previous notes and tests were reviewed. The plan was reviewed with the patient/family, and all questions/concerned were addressed.  It was my pleasure to see Claire Hill today and participate in her care. Please feel free to contact me with any questions or concerns.  Sincerely,  Rexene Alberts, DO Allergy & Immunology  [facility] office: [phone] [facility] office: [place] office: [phone]

## 2019-05-20 NOTE — Assessment & Plan Note (Addendum)
Pruritic torso. No associated rash. Not moisturizing daily. One episode of extreme whole body pruritus without any other symptoms and she used AuviQ.   Discussed with patient that Wynona Luna was prescribed for her as she was supposed to start allergy immunotherapy. It is not to be used for pruritis only. She misunderstood why she was given the medicine and clarified the signs and symptoms of allergic reactions.   Continue proper skin care and moisturize daily.   Use eucrisa twice a day on eczema flares as needed.   Stop hydroxyzine as ineffective.

## 2019-05-20 NOTE — Patient Instructions (Addendum)
Seasonal and perennial allergic rhinoconjunctivitis 2020 skin testing showed: Positive to grass pollen, weed, ragweed, dust mites and cat.  Interim history - symptoms unchanged.  Continue environmental control measures.  Continue levoceterizine 5mg  daily and may take twice a day if needed.  Continue Astelin 1-2 sprays twice a day as needed for drainage.  Continue Flonase 2 sprays daily for nasal congestion.  ContinueSingulair10mg  daily.  May use Patanol 1 drop in each eye twice daily as needed for itchy/watery eyes.   Call us when ready to start after you check with your insurance.  Other atopic dermatitis  Continue proper skin care.   Put lotion/cream on your body every day to help with the itching.   Use eucrisa twice a day on eczema flares as needed.   Heartburn  Continue omeprazole 40 mg dailyand Pepcid 20 mg daily.  Continue reflux lifestyle modification diet.  Follow up with your PCP regarding your stomach issues.  Follow up in 3 months  Skin care recommendations  Bath time: . Always use lukewarm water. AVOID very hot or cold water. Marland Kitchen Keep bathing time to 5-10 minutes. . Do NOT use bubble bath. . Use a mild soap and use just enough to wash the dirty areas. . Do NOT scrub skin vigorously.  . After bathing, pat dry your skin with a towel. Do NOT rub or scrub the skin.  Moisturizers and prescriptions:  . ALWAYS apply moisturizers immediately after bathing (within 3 minutes). This helps to lock-in moisture. . Use the moisturizer several times a day over the whole body. Kermit Balo summer moisturizers include: Aveeno, CeraVe, Cetaphil. Kermit Balo winter moisturizers include: Aquaphor, Vaseline, Cerave, Cetaphil, Eucerin, Vanicream. . When using moisturizers along with medications, the moisturizer should be applied about one hour after applying the medication to prevent diluting effect of the medication or moisturize around where you applied the medications. When not  using medications, the moisturizer can be continued twice daily as maintenance.  Laundry and clothing: . Avoid laundry products with added color or perfumes. . Use unscented hypo-allergenic laundry products such as Tide free, Cheer free & gentle, and All free and clear.  . If the skin still seems dry or sensitive, you can try double-rinsing the clothes. . Avoid tight or scratchy clothing such as wool. . Do not use fabric softeners or dyer sheets.

## 2019-05-20 NOTE — Assessment & Plan Note (Signed)
Past history - perennial rhinoconjunctivitis symptoms for the past 2 years. Does not recall previous ENT work-up. 2020 skin testing showed: Positive to grass pollen, weed, ragweed, dust mites and cat.   Interim history - symptoms unchanged.  Continue environmental control measures.  Continue levoceterizine 5mg  daily and may take twice a day if needed.  Continue Astelin 1-2 sprays twice a day as needed for drainage.  Continue Flonase 2 sprays daily for nasal congestion.  ContinueSingulair10mg  daily.  May use Patanol 1 drop in each eye twice daily as needed for itchy/watery eyes.   Recommend starting allergy injections. Patient will let us know when ready to start. Consent signed.

## 2019-05-21 LAB — URINE CYTOLOGY ANCILLARY ONLY
Bacterial vaginitis: POSITIVE — AB
Candida vaginitis: NEGATIVE

## 2019-05-22 ENCOUNTER — Other Ambulatory Visit: Payer: Self-pay | Admitting: *Deleted

## 2019-05-22 ENCOUNTER — Other Ambulatory Visit: Payer: Self-pay

## 2019-05-22 MED ORDER — METRONIDAZOLE 500 MG PO TABS: 500.0000 mg | ORAL_TABLET | Freq: Two times a day (BID) | ORAL | 0 refills | Status: DC

## 2019-05-22 MED ORDER — EZETIMIBE 10 MG PO TABS: 10.0000 mg | ORAL_TABLET | Freq: Every day | ORAL | 2 refills | Status: DC

## 2019-05-22 MED ORDER — GLIMEPIRIDE 2 MG PO TABS: 2.0000 mg | ORAL_TABLET | Freq: Every day | ORAL | 2 refills | Status: DC

## 2019-05-22 NOTE — Addendum Note (Signed)
Addended by: Kem Boroughs D on: 05/22/2019 03:17 PM   Modules accepted: Orders

## 2019-05-23 ENCOUNTER — Ambulatory Visit (HOSPITAL_COMMUNITY)
Admission: RE | Admit: 2019-05-23 | Discharge: 2019-05-23 | Disposition: A | Discharge status: Home / Self Care | Payer: BC Managed Care – PPO | Source: Ambulatory Visit | Attending: Family Medicine | Admitting: Family Medicine

## 2019-05-23 ENCOUNTER — Other Ambulatory Visit: Payer: Self-pay | Admitting: Family Medicine

## 2019-05-23 ENCOUNTER — Other Ambulatory Visit: Payer: Self-pay

## 2019-05-23 DIAGNOSIS — G8929 Other chronic pain: Secondary | ICD-10-CM

## 2019-05-23 DIAGNOSIS — R519 Headache, unspecified: Secondary | ICD-10-CM

## 2019-05-23 DIAGNOSIS — G4489 Other headache syndrome: Secondary | ICD-10-CM | POA: Diagnosis present

## 2019-06-05 NOTE — Progress Notes (Signed)
Virtual Visit via Video Note The purpose of this virtual visit is to provide medical care while limiting exposure to the novel coronavirus.    Consent was obtained for video visit:  Yes.   Answered questions that patient had about telehealth interaction:  Yes.   I discussed the limitations, risks, security and privacy concerns of performing an evaluation and management service by telemedicine. I also discussed with the patient that there may be a patient responsible charge related to this service. The patient expressed understanding and agreed to proceed.  Pt location: Home Physician Location: Home Name of referring provider:  Ann Held, * I connected with [name] at patients initiation/request on 06/06/2019 at  9:10 AM EDT by video enabled telemedicine application and verified that I am speaking with the correct person using two identifiers. Pt MRN:  [id number] Pt DOB:  Jun 04, 1971 Video Participants:  Claire Payton Mccallum   History of Present Illness:  Claire Hill is a 48 year old female with diabetes mellitus, COPD and hyperlipidemia who presents for headaches.  History supplemented by referring provider note.  Onset:  Started 3 weeks ago.   Location:  Cap distribution (top of head, temples and front), pressure around eyes Quality:  Squeezing/pressure Intensity:  moderate.  She denies new headache, thunderclap headache  Aura:  No Associated symptoms:  Photophobia.  She denies associated nausea, vomiting, phonophobia, unilateral numbness or weakness. Duration:  1 hour Frequency:  3 headaches in last 3 weeks Triggers:  no Relieving factors:  Drinks water and Tylenol Activity:  Able to function  MRI of brain without contrast from 05/23/19 was personally reviewed and demonstrated mild nonspecific hyperintense foci in the cerebral white matter, favoring chronic small vessel ischemic changes.  No acute intracranial abnormalities.   Current NSAIDS:  ASA  56m Current analgesics:  Tylenol Current triptans:  none Current ergotamine:  none Current anti-emetic:  none Current muscle relaxants: Flexeril 175mCurrent anti-anxiolytic:  none Current sleep aide:  none Current Antihypertensive medications:  none Current Antidepressant medications:  none Current Anticonvulsant medications:  none Current anti-CGRP:  none Current Vitamins/Herbal/Supplements:  none Current Antihistamines/Decongestants:  Flonase, Singulair Other therapy:  none Hormone/birth control:  none  Past NSAIDS:  none Past analgesics:  none Past abortive triptans:  none Past abortive ergotamine:  none Past muscle relaxants:  none Past anti-emetic:  none Past antihypertensive medications:  none Past antidepressant medications:  none Past anticonvulsant medications:  none Past anti-CGRP:  none Past vitamins/Herbal/Supplements:  none Past antihistamines/decongestants:  none Other past therapies:  none  Caffeine:  1 cup of coffee not daily Diet:  waters Exercise:  routine Depression:  A little; Anxiety:  no Other pain: poor.   Sleep hygiene:  Wakes up at night.  Difficulty falling asleep. Family history of headache: no  Past Medical History: Past Medical History:  Diagnosis Date   Allergy    Anemia    Asthma    pt states no-no inhalers   COPD (chronic obstructive pulmonary disease) (HCC)    Diabetes mellitus    Diabetes mellitus without complication (HCC)    Glaucoma    Herpes    Right eye   Herpes simplex of eye    Hyperlipidemia    OCD (obsessive compulsive disorder)     Medications: Outpatient Encounter Medications as of 06/06/2019  Medication Sig   aspirin (ASPIRIN EC) 81 MG EC tablet Take 1 tablet (81 mg total) by mouth daily. Swallow whole.   atorvastatin (LIPITOR) 80 MG  tablet Take 1 tablet (80 mg total) by mouth daily.   azelastine (ASTELIN) 0.1 % nasal spray Take 1-2 sprays twice a day as needed for runny nose.   blood glucose  meter kit and supplies KIT Use a directed twice a day.  E11.8.  Use Relion Meter   blood glucose meter kit and supplies KIT Dispense based on patient and insurance preference. Use up to four times daily as directed. (FOR ICD-9 250.00, 250.01).   brimonidine (ALPHAGAN) 0.2 % ophthalmic solution    brimonidine-timolol (COMBIGAN) 0.2-0.5 % ophthalmic solution Place 1 drop into the right eye every 12 (twelve) hours. Reported on 03/16/2016   Crisaborole (EUCRISA) 2 % OINT Apply 1 application topically 2 (two) times a day.   cyclobenzaprine (FLEXERIL) 10 MG tablet Take 1 tablet (10 mg total) by mouth at bedtime.   ezetimibe (ZETIA) 10 MG tablet Take 1 tablet (10 mg total) by mouth daily.   famciclovir (FAMVIR) 500 MG tablet Take 1 tablet (500 mg total) by mouth 3 (three) times daily.   famotidine (PEPCID) 20 MG tablet Take 1 tablet (20 mg total) by mouth 2 (two) times daily. (Patient not taking: Reported on 05/20/2019)   fluticasone (FLONASE) 50 MCG/ACT nasal spray Take 1-2 sprays daily for nasal congestion.   glimepiride (AMARYL) 2 MG tablet Take 1 tablet (2 mg total) by mouth daily before breakfast.   glucose blood (RELION GLUCOSE TEST STRIPS) test strip Use as instructed   latanoprost (XALATAN) 0.005 % ophthalmic solution    levocetirizine (XYZAL) 5 MG tablet TAKE ONE TABLET BY MOUTH EVERY EVENING   meloxicam (MOBIC) 15 MG tablet Take 1 tablet (15 mg total) by mouth daily.   metFORMIN (GLUCOPHAGE-XR) 500 MG 24 hr tablet TAKE TWO TABLETS BY MOUTH DAILY   metroNIDAZOLE (FLAGYL) 500 MG tablet Take 1 tablet (500 mg total) by mouth 2 (two) times daily with a meal. DO NOT CONSUME ALCOHOL WHILE TAKING THIS MEDICATION.   montelukast (SINGULAIR) 10 MG tablet Take 1 tablet (10 mg total) by mouth at bedtime.   montelukast (SINGULAIR) 10 MG tablet Take 1 tablet (10 mg total) by mouth at bedtime.   NONFORMULARY OR COMPOUNDED ITEM Alcohol pads  #1 box  Use as directed   olopatadine (PATANOL) 0.1  % ophthalmic solution Place 1 drop into both eyes 2 (two) times daily as needed for allergies.   omeprazole (PRILOSEC) 40 MG capsule TAKE ONE CAPSULE BY MOUTH DAILY   omeprazole (PRILOSEC) 40 MG capsule Take 1 capsule (40 mg total) by mouth daily.   RELION LANCETS THIN 26G MISC Use as directed twice a day. E11.8   No facility-administered encounter medications on file as of 06/06/2019.     Allergies: No Known Allergies  Family History: Family History  Problem Relation Age of Onset   Diabetes Father    Hypertension Mother    Diabetes Paternal Grandmother    Cancer Other        LUNG, BRAIN AND STOMACH    Cancer Cousin        ovarian    Social History: Social History   Socioeconomic History   Marital status: Married    Spouse name: Not on file   Number of children: Not on file   Years of education: Not on file   Highest education level: Not on file  Occupational History   Occupation: factory production  Social Needs   Financial resource strain: Not on file   Food insecurity    Worry: Not on file  Inability: Not on file   Transportation needs    Medical: Not on file    Non-medical: Not on file  Tobacco Use   Smoking status: Never Smoker   Smokeless tobacco: Never Used  Substance and Sexual Activity   Alcohol use: No   Drug use: No   Sexual activity: Not Currently    Partners: Male  Lifestyle   Physical activity    Days per week: Not on file    Minutes per session: Not on file   Stress: Not on file  Relationships   Social connections    Talks on phone: Not on file    Gets together: Not on file    Attends religious service: Not on file    Active member of club or organization: Not on file    Attends meetings of clubs or organizations: Not on file    Relationship status: Not on file   Intimate partner violence    Fear of current or ex partner: Not on file    Emotionally abused: Not on file    Physically abused: Not on file     Forced sexual activity: Not on file  Other Topics Concern   Not on file  Social History Narrative   ** Merged History Encounter **    Exercise-no    Observations/Objective:   Height _0  (1.727 m), weight 161 lb (73 kg). No acute distress.  Alert and oriented.  Speech fluent and not dysarthric.  Language intact.  Eyes orthophoric on primary gaze.  Face symmetric.  Assessment and Plan:   1.  Episodic tension-type headaches, not intractable 2.  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  1.  For preventative management, start nortriptyline 28m at bedtime.  We can increase dose to 260mat bedtime in 4 weeks if needed. 2.  For abortive therapy, Tylenol 3.  Limit use of pain relievers to no more than 2 days out of week to prevent risk of rebound or medication-overuse headache. 4.  Keep headache diary 5.  Exercise, hydration, caffeine cessation, sleep hygiene, monitor for and avoid triggers 6.  Consider:  magnesium citrate 40039maily, riboflavin 400m91mily, and coenzyme Q10 100mg71mee times daily 7. Always keep in mind that currently taking a hormone or birth control may be a possible trigger or aggravating factor for migraine. 8. Given evidence of cerebrovascular disease on MRI, agree with daily ASA 81mg 55mollow up 4 months   Follow Up Instructions:    -I discussed the assessment and treatment plan with the patient. The patient was provided an opportunity to ask questions and all were answered. The patient agreed with the plan and demonstrated an understanding of the instructions.   The patient was advised to call back or seek an in-person evaluation if the symptoms worsen or if the condition fails to improve as anticipated.  Dorene Bruni RDudley Major

## 2019-06-06 ENCOUNTER — Telehealth (INDEPENDENT_AMBULATORY_CARE_PROVIDER_SITE_OTHER): Payer: BC Managed Care – PPO | Admitting: Neurology

## 2019-06-06 ENCOUNTER — Encounter: Payer: Self-pay | Admitting: Neurology

## 2019-06-06 ENCOUNTER — Other Ambulatory Visit: Payer: Self-pay

## 2019-06-06 VITALS — Ht 68.0 in | Wt 161.0 lb

## 2019-06-06 DIAGNOSIS — E785 Hyperlipidemia, unspecified: Secondary | ICD-10-CM

## 2019-06-06 DIAGNOSIS — G44219 Episodic tension-type headache, not intractable: Secondary | ICD-10-CM

## 2019-06-06 DIAGNOSIS — IMO0002 Reserved for concepts with insufficient information to code with codable children: Secondary | ICD-10-CM

## 2019-06-06 DIAGNOSIS — E1151 Type 2 diabetes mellitus with diabetic peripheral angiopathy without gangrene: Secondary | ICD-10-CM

## 2019-06-06 DIAGNOSIS — I679 Cerebrovascular disease, unspecified: Secondary | ICD-10-CM

## 2019-06-06 MED ORDER — NORTRIPTYLINE HCL 10 MG PO CAPS: 10.0000 mg | ORAL_CAPSULE | Freq: Every day | ORAL | 3 refills | Status: DC

## 2019-06-22 ENCOUNTER — Other Ambulatory Visit: Payer: Self-pay | Admitting: Family Medicine

## 2019-07-01 ENCOUNTER — Other Ambulatory Visit: Payer: Self-pay | Admitting: Family Medicine

## 2019-07-01 DIAGNOSIS — G4489 Other headache syndrome: Secondary | ICD-10-CM

## 2019-08-19 ENCOUNTER — Ambulatory Visit: Payer: BC Managed Care – PPO

## 2019-08-21 ENCOUNTER — Ambulatory Visit: Payer: BC Managed Care – PPO | Admitting: Allergy

## 2019-08-23 ENCOUNTER — Other Ambulatory Visit: Payer: Self-pay | Admitting: Family Medicine

## 2019-08-25 ENCOUNTER — Other Ambulatory Visit: Payer: Self-pay | Admitting: Family Medicine

## 2019-08-26 ENCOUNTER — Other Ambulatory Visit: Payer: Self-pay

## 2019-08-26 ENCOUNTER — Ambulatory Visit: Payer: Self-pay | Admitting: Allergy

## 2019-08-26 NOTE — Progress Notes (Deleted)
Follow Up Note  RE: Claire Hill MRN: [id number] DOB: Aug 16, 1971 Date of Office Visit: 08/26/2019  Referring provider: Ann Held, * Primary care provider: Carollee Herter, Alferd Apa, DO  Chief Complaint: No chief complaint on file.  History of Present Illness: I had the pleasure of seeing Claire Hill for a follow up visit at the [facility] on 08/26/2019. She is a 48 y.o. female, who is being followed for allergic rhinoconjunctivitis, atopic dermatitis and heartburn. Today she is here for regular follow up visit. She is accompanied today by her *** who provided/contributed to the history. Her previous allergy office visit was on 05/20/2019 with Dr. Maudie Mercury.   Seasonal and perennial allergic rhinoconjunctivitis Past history - perennial rhinoconjunctivitis symptoms for the past 2 years. Does not recall previous ENT work-up. 2020 skin testing showed: Positive to grass pollen, weed, ragweed, dust mites and cat.   Interim history - symptoms unchanged.  Continueenvironmental control measures.  Continue levoceterizine '5mg'$  daily and may take twice a day if needed.  Continue Astelin 1-2 sprays twice a day as needed for drainage.  Continue Flonase 2 sprays daily for nasal congestion.  ContinueSingulair'10mg'$  daily.  May use Patanol 1 drop in each eye twice daily as needed for itchy/watery eyes.   Recommend starting allergy injections. Patient will let us know when ready to start. Consent signed.   Other atopic dermatitis Pruritic torso. No associated rash. Not moisturizing daily. One episode of extreme whole body pruritus without any other symptoms and she used AuviQ.   Discussed with patient that Wynona Luna was prescribed for her as she was supposed to start allergy immunotherapy. It is not to be used for pruritis only. She misunderstood why she was given the medicine and clarified the signs and symptoms of allergic reactions.   Continue proper skin  care and moisturize daily.   Use eucrisa twice a day on eczema flares as needed.   Stop hydroxyzine as ineffective.   Heartburn Past history - Patient concerned about food allergies as she noticed clearing of her throat after eating [place] food such as tacos, [place], pork, or salad.  Sometimes she also has emesis episodes.  However she can eat these foods at times without any symptoms.  She does have heartburn and is on omeprazole currently. 2020 skin testing was negative to foods. Interim history - stable.    Continue omeprazole 40 mg dailyand Pepcid 20 mg daily.  Continue reflux lifestyle modification diet.  Follow up with PCP regarding abdominal pain.   Return in about 3 months (around 08/20/2019).   Assessment and Plan: Claire Hill is a 48 y.o. female with: No problem-specific Assessment & Plan notes found for this encounter.  No follow-ups on file.  No orders of the defined types were placed in this encounter.  Lab Orders  No laboratory test(s) ordered today    Diagnostics: Spirometry:  Tracings reviewed. Her effort: {Blank single:19197::"Good reproducible efforts.","It was hard to get consistent efforts and there is a question as to whether this reflects a maximal maneuver.","Poor effort, data can not be interpreted."} FVC: ***L FEV1: ***L, ***% predicted FEV1/FVC ratio: ***% Interpretation: {Blank single:19197::"Spirometry consistent with mild obstructive disease","Spirometry consistent with moderate obstructive disease","Spirometry consistent with severe obstructive disease","Spirometry consistent with possible restrictive disease","Spirometry consistent with mixed obstructive and restrictive disease","Spirometry uninterpretable due to technique","Spirometry consistent with normal pattern","No overt abnormalities noted given today's efforts"}.  Please see scanned spirometry results for details.  Skin Testing: {Blank single:19197::"Select foods","Environmental  allergy panel","Environmental allergy panel and select foods","Food allergy panel","None","Deferred due to recent antihistamines use"}. Positive test to: ***. Negative test to: ***.  Results discussed with patient/family.   Medication List:  Current Outpatient Medications  Medication Sig Dispense Refill  . aspirin (ASPIRIN EC) 81 MG EC tablet Take 1 tablet (81 mg total) by mouth daily. Swallow whole. 30 tablet 12  . atorvastatin (LIPITOR) 80 MG tablet Take 1 tablet (80 mg total) by mouth daily. 90 tablet 3  . azelastine (ASTELIN) 0.1 % nasal spray Take 1-2 sprays twice a day as needed for runny nose. 30 mL 5  . blood glucose meter kit and supplies KIT Use a directed twice a day.  E11.8.  Use Relion Meter 1 each 0  . blood glucose meter kit and supplies KIT Dispense based on patient and insurance preference. Use up to four times daily as directed. (FOR ICD-9 250.00, 250.01). 1 each 0  . brimonidine (ALPHAGAN) 0.2 % ophthalmic solution   98  . brimonidine-timolol (COMBIGAN) 0.2-0.5 % ophthalmic solution Place 1 drop into the right eye every 12 (twelve) hours. Reported on 03/16/2016    . Crisaborole (EUCRISA) 2 % OINT Apply 1 application topically 2 (two) times a day. 100 g 5  . cyclobenzaprine (FLEXERIL) 10 MG tablet TAKE ONE TABLET BY MOUTH EVERY NIGHT AT BEDTIME 30 tablet 0  . ezetimibe (ZETIA) 10 MG tablet Take 1 tablet (10 mg total) by mouth daily. 30 tablet 2  . famciclovir (FAMVIR) 500 MG tablet Take 1 tablet (500 mg total) by mouth 3 (three) times daily. 21 tablet 0  . famotidine (PEPCID) 20 MG tablet Take 20 mg by mouth daily.    . fluticasone (FLONASE) 50 MCG/ACT nasal spray Take 1-2 sprays daily for nasal congestion. 16 g 5  . glimepiride (AMARYL) 2 MG tablet Take 1 tablet (2 mg total) by mouth daily before breakfast. 30 tablet 2  . glucose blood (RELION GLUCOSE TEST STRIPS) test strip Use as instructed 100 each 5  . latanoprost (XALATAN) 0.005 % ophthalmic solution     .  levocetirizine (XYZAL) 5 MG tablet TAKE ONE TABLET BY MOUTH EVERY EVENING 90 tablet 3  . metFORMIN (GLUCOPHAGE-XR) 500 MG 24 hr tablet TAKE TWO TABLETS BY MOUTH DAILY 180 tablet 1  . metroNIDAZOLE (FLAGYL) 500 MG tablet Take 1 tablet (500 mg total) by mouth 2 (two) times daily with a meal. DO NOT CONSUME ALCOHOL WHILE TAKING THIS MEDICATION. 14 tablet 0  . montelukast (SINGULAIR) 10 MG tablet Take 1 tablet (10 mg total) by mouth at bedtime. 90 tablet 3  . montelukast (SINGULAIR) 10 MG tablet Take 1 tablet (10 mg total) by mouth at bedtime. 30 tablet 5  . NONFORMULARY OR COMPOUNDED ITEM Alcohol pads  #1 box  Use as directed 1 each 5  . nortriptyline (PAMELOR) 10 MG capsule Take 1 capsule (10 mg total) by mouth at bedtime. 30 capsule 3  . olopatadine (PATANOL) 0.1 % ophthalmic solution Place 1 drop into both eyes 2 (two) times daily as needed for allergies. 5 mL 5  . RELION LANCETS THIN 26G MISC Use as directed twice a day. E11.8 100 each 5   No current facility-administered medications for this visit.    Allergies: No Known Allergies I reviewed her past medical history, social history, family history, and environmental history and no significant changes have been reported from her previous visit.  Review of Systems  Constitutional: Negative for appetite change, chills, fever and unexpected weight change.  HENT: Negative for congestion, rhinorrhea and sneezing.   Eyes: Negative for itching.  Respiratory: Negative for cough, chest tightness, shortness of breath and wheezing.   Cardiovascular: Negative for chest pain.  Gastrointestinal: Positive for abdominal pain.  Genitourinary: Negative for difficulty urinating.  Skin: Negative for rash.       Pruritus  Allergic/Immunologic: Positive for environmental allergies. Negative for food allergies.  Neurological: Negative for headaches.   Objective: LMP  (LMP Unknown) Comment: patient could not remember last period 05/15/17 There is no height or  weight on file to calculate BMI. Physical Exam  Constitutional: She is oriented to person, place, and time. She appears well-developed and well-nourished.  HENT:  Head: Normocephalic and atraumatic.  Right Ear: External ear normal.  Left Ear: External ear normal.  Nose: Nose normal.  Mouth/Throat: Oropharynx is clear and moist.  Eyes: Conjunctivae and EOM are normal.  Neck: Neck supple.  Cardiovascular: Normal rate, regular rhythm and normal heart sounds. Exam reveals no gallop and no friction rub.  No murmur heard. Pulmonary/Chest: Effort normal and breath sounds normal. She has no wheezes. She has no rales.  Abdominal: Soft.  Neurological: She is alert and oriented to person, place, and time.  Skin: Skin is warm. No rash noted.  Psychiatric: She has a normal mood and affect. Her behavior is normal.  Nursing note and vitals reviewed.  Previous notes and tests were reviewed. The plan was reviewed with the patient/family, and all questions/concerned were addressed.  It was my pleasure to see Claire Hill today and participate in her care. Please feel free to contact me with any questions or concerns.  Sincerely,  Rexene Alberts, DO Allergy & Immunology  [facility] office: [phone] [facility] office: [place] office: [phone]

## 2019-08-29 ENCOUNTER — Other Ambulatory Visit: Payer: Self-pay | Admitting: Family Medicine

## 2019-09-17 ENCOUNTER — Other Ambulatory Visit: Payer: Self-pay | Admitting: Allergy

## 2019-10-08 ENCOUNTER — Telehealth: Payer: BC Managed Care – PPO | Admitting: Neurology

## 2019-10-25 ENCOUNTER — Other Ambulatory Visit: Payer: Self-pay | Admitting: Allergy

## 2019-10-28 ENCOUNTER — Other Ambulatory Visit: Payer: Self-pay | Admitting: Allergy

## 2019-10-28 NOTE — Telephone Encounter (Signed)
Courtesy refill  

## 2019-11-15 ENCOUNTER — Other Ambulatory Visit: Payer: Self-pay | Admitting: Family Medicine

## 2019-11-18 ENCOUNTER — Ambulatory Visit: Payer: Self-pay | Admitting: Family Medicine

## 2019-11-24 ENCOUNTER — Other Ambulatory Visit: Payer: Self-pay | Admitting: Allergy

## 2019-12-25 ENCOUNTER — Other Ambulatory Visit: Payer: Self-pay | Admitting: Allergy

## 2019-12-25 ENCOUNTER — Other Ambulatory Visit: Payer: Self-pay | Admitting: Neurology

## 2020-01-22 ENCOUNTER — Encounter: Payer: Self-pay | Admitting: Allergy

## 2020-01-22 ENCOUNTER — Ambulatory Visit (INDEPENDENT_AMBULATORY_CARE_PROVIDER_SITE_OTHER): Payer: 59 | Admitting: Allergy

## 2020-01-22 ENCOUNTER — Other Ambulatory Visit: Payer: Self-pay

## 2020-01-22 VITALS — BP 120/82 | HR 100 | Temp 97.9°F | Resp 16

## 2020-01-22 DIAGNOSIS — J302 Other seasonal allergic rhinitis: Secondary | ICD-10-CM

## 2020-01-22 DIAGNOSIS — H1010 Acute atopic conjunctivitis, unspecified eye: Secondary | ICD-10-CM

## 2020-01-22 DIAGNOSIS — R232 Flushing: Secondary | ICD-10-CM

## 2020-01-22 DIAGNOSIS — R12 Heartburn: Secondary | ICD-10-CM | POA: Diagnosis not present

## 2020-01-22 DIAGNOSIS — Z7185 Encounter for immunization safety counseling: Secondary | ICD-10-CM

## 2020-01-22 DIAGNOSIS — L2089 Other atopic dermatitis: Secondary | ICD-10-CM

## 2020-01-22 DIAGNOSIS — Z7189 Other specified counseling: Secondary | ICD-10-CM

## 2020-01-22 DIAGNOSIS — J3089 Other allergic rhinitis: Secondary | ICD-10-CM

## 2020-01-22 NOTE — Patient Instructions (Addendum)
Seasonal and perennial allergic rhinoconjunctivitis 2020 skin testing showed: Positive to grass pollen, weed, ragweed, dust mites and cat.    Continueenvironmental control measures.  Continue levoceterizine 5mg  daily and may take twice a day if needed.  Continue Astelin 1-2 sprays twice a day as needed for drainage.  Continue Flonase 2 sprays daily for nasal congestion.  ContinueSingulair10mg  daily.  May use Patanol 1 drop in each eye twice daily as needed for itchy/watery eyes.   Recommend starting allergy injections.  Check with your insurance about coverage.    Heartburn  Continue omeprazole 40 mg dailyand Pepcid 20 mg daily.  Continue reflux lifestyle modification diet.  Facial flushing  Follow up with your PCP about the facial flushing.  Follow up in 3 months or sooner if needed.

## 2020-01-22 NOTE — Assessment & Plan Note (Signed)
Stable with no issues.  Continue proper skin care measures. 

## 2020-01-22 NOTE — Assessment & Plan Note (Signed)
Discussed the risks and benefits of COVID-19 vaccination. No history of anaphylactic or severe reactions to vaccines/medications.  Patient has no contraindications in her medical history.   Recommend getting the COVID-19 vaccine when available.

## 2020-01-22 NOTE — Assessment & Plan Note (Signed)
Past history - Patient concerned about food allergies as she noticed clearing of her throat after eating [place] food such as tacos, [place], pork, or salad.  Sometimes she also has emesis episodes.  However she can eat these foods at times without any symptoms.  She does have heartburn and is on omeprazole currently. 2020 skin testing was negative to foods. Interim history - unchanged, some foods still trigger the reflux.   Continue omeprazole 40 mg daily and Pepcid 20 mg daily.  Continue reflux lifestyle modification diet.

## 2020-01-22 NOTE — Assessment & Plan Note (Signed)
Patient mentions facial flushing which has been ongoing since she was a young child.  Monitor symptoms and follow-up with PCP regarding this.

## 2020-01-22 NOTE — Progress Notes (Signed)
Follow Up Note  RE: [name] MRN: [id number] DOB: [birth year] Date of Office Visit: 01/22/2020  Referring provider: Ann Held, * Primary care provider: Carollee Herter, Alferd Apa, DO  Chief Complaint: Allergic Rhinitis   History of Present Illness: I had the pleasure of seeing Claire Hill for a follow up visit at the [facility] [place] on 01/22/2020. She is a 49 y.o. female, who is being followed for allergic rhinoconjunctivitis, atopic dermatitis, heartburn. Her previous allergy office visit was on 05/20/2019 with Dr. Maudie Mercury. Hill is a regular follow up visit. Patient declines Spanish interpreter.   Seasonal and perennial allergic rhinoconjunctivitis Currently on Singulair and Xyzal daily.  Using Flonase and Astelin as needed with good benefit. Using eye drops if needed. She has a new insurance and needs to find out if allergy injections are covered.  Other atopic dermatitis Doing well with no issues.   Facial flushing Patient noticed some facial flushing on and off. No triggers. Apparently this has been going on since she was a young child.   Heartburn Sometimes have some reflux after eating certain foods. Complaining of gas but no abdominal pains.   Covid-19 vaccine Patient has some questions about the vaccine.  Assessment and Plan: Claire Hill is a 49 y.o. female with: Seasonal and perennial allergic rhinoconjunctivitis Past history - perennial rhinoconjunctivitis symptoms for the past 2 years. Does not recall previous ENT work-up. 2020 skin testing showed: Positive to grass pollen, weed, ragweed, dust mites and cat.   Interim history - symptoms stable.  Continueenvironmental control measures.  Continue levoceterizine '5mg'$  daily and may take twice a day if needed.  Continue Astelin 1-2 sprays twice a day as needed for drainage.  Continue Flonase 2 sprays daily for nasal congestion.  ContinueSingulair'10mg'$  daily.  May use  Patanol 1 drop in each eye twice daily as needed for itchy/watery eyes.   Recommend starting allergy injections.  Check with your insurance about coverage.    Other atopic dermatitis Stable with no issues.  Continue proper skin care measures.  Facial flushing Patient mentions facial flushing which has been ongoing since she was a young child.  Monitor symptoms and follow-up with PCP regarding this.  Heartburn Past history - Patient concerned about food allergies as she noticed clearing of her throat after eating [place] food such as tacos, [place], pork, or salad.  Sometimes she also has emesis episodes.  However she can eat these foods at times without any symptoms.  She does have heartburn and is on omeprazole currently. 2020 skin testing was negative to foods. Interim history - unchanged, some foods still trigger the reflux.   Continue omeprazole 40 mg daily and Pepcid 20 mg daily.  Continue reflux lifestyle modification diet.  Vaccine counseling Discussed the risks and benefits of COVID-19 vaccination. No history of anaphylactic or severe reactions to vaccines/medications.  Patient has no contraindications in her medical history.   Recommend getting the COVID-19 vaccine when available.   Return in about 3 months (around 04/22/2020).  Diagnostics: None.   Medication List:  Current Outpatient Medications  Medication Sig Dispense Refill  . ACCU-CHEK AVIVA PLUS test strip USE TO CHECK BLOOD SUGAR FOUR TIMES A DAY 300 strip 1  . Accu-Chek Softclix Lancets lancets USE TO CHECK BLOOD SUGAR 4 TIMES A DAY 300 each 1  . aspirin (ASPIRIN EC) 81 MG EC tablet Take 1 tablet (81 mg total) by mouth daily. Swallow whole. 30 tablet 12  . atorvastatin (  LIPITOR) 80 MG tablet Take 1 tablet (80 mg total) by mouth daily. 90 tablet 3  . azelastine (ASTELIN) 0.1 % nasal spray Take 1-2 sprays twice a day as needed for runny nose. 30 mL 5  . blood glucose meter kit and supplies KIT Use a directed  twice a day.  E11.8.  Use Relion Meter 1 each 0  . blood glucose meter kit and supplies KIT Dispense based on patient and insurance preference. Use up to four times daily as directed. (FOR ICD-9 250.00, 250.01). 1 each 0  . brimonidine (ALPHAGAN) 0.2 % ophthalmic solution   98  . brimonidine-timolol (COMBIGAN) 0.2-0.5 % ophthalmic solution Place 1 drop into the right eye every 12 (twelve) hours. Reported on 03/16/2016    . Crisaborole (EUCRISA) 2 % OINT Apply 1 application topically 2 (two) times a day. 100 g 5  . cyclobenzaprine (FLEXERIL) 10 MG tablet TAKE ONE TABLET BY MOUTH EVERY NIGHT AT BEDTIME 30 tablet 0  . ezetimibe (ZETIA) 10 MG tablet TAKE ONE TABLET BY MOUTH DAILY 90 tablet 1  . famciclovir (FAMVIR) 500 MG tablet Take 1 tablet (500 mg total) by mouth 3 (three) times daily. 21 tablet 0  . famotidine (PEPCID) 20 MG tablet TAKE ONE TABLET BY MOUTH TWICE A DAY 60 tablet 0  . fluticasone (FLONASE) 50 MCG/ACT nasal spray Take 1-2 sprays daily for nasal congestion. 16 g 5  . glimepiride (AMARYL) 2 MG tablet TAKE ONE TABLET BY MOUTH EVERY MORNING BEFORE BREAKFAST 180 tablet 1  . latanoprost (XALATAN) 0.005 % ophthalmic solution     . levocetirizine (XYZAL) 5 MG tablet TAKE ONE TABLET BY MOUTH EVERY EVENING 90 tablet 3  . metFORMIN (GLUCOPHAGE-XR) 500 MG 24 hr tablet TAKE TWO TABLETS BY MOUTH DAILY 180 tablet 1  . metroNIDAZOLE (FLAGYL) 500 MG tablet Take 1 tablet (500 mg total) by mouth 2 (two) times daily with a meal. DO NOT CONSUME ALCOHOL WHILE TAKING THIS MEDICATION. 14 tablet 0  . montelukast (SINGULAIR) 10 MG tablet Take 1 tablet (10 mg total) by mouth at bedtime. 30 tablet 5  . NONFORMULARY OR COMPOUNDED ITEM Alcohol pads  #1 box  Use as directed 1 each 5  . nortriptyline (PAMELOR) 10 MG capsule TAKE ONE CAPSULE BY MOUTH EVERY NIGHT AT BEDTIME 30 capsule 2  . olopatadine (PATANOL) 0.1 % ophthalmic solution Place 1 drop into both eyes 2 (two) times daily as needed for allergies. 5 mL 5  .  omeprazole (PRILOSEC) 40 MG capsule TAKE ONE CAPSULE BY MOUTH DAILY 30 capsule 0   No current facility-administered medications for this visit.   Allergies: No Known Allergies I reviewed her past medical history, social history, family history, and environmental history and no significant changes have been reported from her previous visit.  Review of Systems  Constitutional: Negative for appetite change, chills, fever and unexpected weight change.  HENT: Negative for congestion, rhinorrhea and sneezing.   Eyes: Negative for itching.  Respiratory: Negative for cough, chest tightness, shortness of breath and wheezing.   Cardiovascular: Negative for chest pain.  Gastrointestinal: Negative for abdominal pain.  Genitourinary: Negative for difficulty urinating.  Skin: Negative for rash.       Facial flushing  Allergic/Immunologic: Positive for environmental allergies. Negative for food allergies.  Neurological: Negative for headaches.   Objective: BP 120/82   Pulse 100   Temp 97.9 F (36.6 C) (Temporal)   Resp 16   LMP  (LMP Unknown) Comment: patient could not remember last period  05/15/17  SpO2 97%  There is no height or weight on file to calculate BMI. Physical Exam  Constitutional: She is oriented to person, place, and time. She appears well-developed and well-nourished.  HENT:  Head: Normocephalic and atraumatic.  Right Ear: External ear normal.  Left Ear: External ear normal.  Nose: Nose normal.  Mouth/Throat: Oropharynx is clear and moist.  Eyes: Conjunctivae and EOM are normal.  Cardiovascular: Normal rate, regular rhythm and normal heart sounds. Exam reveals no gallop and no friction rub.  No murmur heard. Pulmonary/Chest: Effort normal and breath sounds normal. She has no wheezes. She has no rales.  Abdominal: Soft.  Musculoskeletal:     Cervical back: Neck supple.  Neurological: She is alert and oriented to person, place, and time.  Skin: Skin is warm. No rash noted.    Psychiatric: She has a normal mood and affect. Her behavior is normal.  Nursing note and vitals reviewed.  Previous notes and tests were reviewed. The plan was reviewed with the patient/family, and all questions/concerned were addressed.  It was my pleasure to see Claire Hill and participate in her care. Please feel free to contact me with any questions or concerns.  Sincerely,  Rexene Alberts, DO Allergy & Immunology  [facility] office: [phone] [facility] office: [place] office: [phone]

## 2020-01-22 NOTE — Assessment & Plan Note (Signed)
Past history - perennial rhinoconjunctivitis symptoms for the past 2 years. Does not recall previous ENT work-up. 2020 skin testing showed: Positive to grass pollen, weed, ragweed, dust mites and cat.   Interim history - symptoms stable.  Continueenvironmental control measures.  Continue levoceterizine 5mg  daily and may take twice a day if needed.  Continue Astelin 1-2 sprays twice a day as needed for drainage.  Continue Flonase 2 sprays daily for nasal congestion.  ContinueSingulair10mg  daily.  May use Patanol 1 drop in each eye twice daily as needed for itchy/watery eyes.   Recommend starting allergy injections.  Check with your insurance about coverage.

## 2020-01-27 ENCOUNTER — Telehealth: Payer: Self-pay | Admitting: Allergy

## 2020-01-27 NOTE — Telephone Encounter (Signed)
Patient's insurance company called office to clarify what patient was needing as far as allergy injection CPT codes. I informed insurance of allergy vials and injection codes and insurance stated she has a $200 deductible, $800 out of pocket max and then insurance would cover 90% and patient is responsible for 10%. Insurance wanted our office to call patient to inform her of this. Patient was informed to call insurance back and give allergy vial and injection codes so that she understand what she will be responsible for. Patient verbalized understanding.

## 2020-02-11 ENCOUNTER — Ambulatory Visit (INDEPENDENT_AMBULATORY_CARE_PROVIDER_SITE_OTHER): Payer: 59 | Admitting: Family Medicine

## 2020-02-11 ENCOUNTER — Other Ambulatory Visit: Payer: Self-pay

## 2020-02-11 ENCOUNTER — Encounter: Payer: Self-pay | Admitting: Family Medicine

## 2020-02-11 VITALS — BP 108/78 | HR 86 | Temp 97.9°F | Resp 18 | Ht 68.0 in | Wt 162.0 lb

## 2020-02-11 DIAGNOSIS — Z1231 Encounter for screening mammogram for malignant neoplasm of breast: Secondary | ICD-10-CM

## 2020-02-11 DIAGNOSIS — Z0000 Encounter for general adult medical examination without abnormal findings: Secondary | ICD-10-CM

## 2020-02-11 DIAGNOSIS — E1165 Type 2 diabetes mellitus with hyperglycemia: Secondary | ICD-10-CM | POA: Diagnosis not present

## 2020-02-11 DIAGNOSIS — E1169 Type 2 diabetes mellitus with other specified complication: Secondary | ICD-10-CM | POA: Diagnosis not present

## 2020-02-11 DIAGNOSIS — I10 Essential (primary) hypertension: Secondary | ICD-10-CM

## 2020-02-11 DIAGNOSIS — E785 Hyperlipidemia, unspecified: Secondary | ICD-10-CM | POA: Diagnosis not present

## 2020-02-11 DIAGNOSIS — Z01419 Encounter for gynecological examination (general) (routine) without abnormal findings: Secondary | ICD-10-CM

## 2020-02-11 LAB — CBC WITH DIFFERENTIAL/PLATELET
Basophils Absolute: 0 10*3/uL (ref 0.0–0.1)
Basophils Relative: 1 % (ref 0.0–3.0)
Eosinophils Absolute: 0.1 10*3/uL (ref 0.0–0.7)
Eosinophils Relative: 2.2 % (ref 0.0–5.0)
HCT: 40.6 % (ref 36.0–46.0)
Hemoglobin: 13.1 g/dL (ref 12.0–15.0)
Lymphocytes Relative: 51 % — ABNORMAL HIGH (ref 12.0–46.0)
Lymphs Abs: 1.6 10*3/uL (ref 0.7–4.0)
MCHC: 32.4 g/dL (ref 30.0–36.0)
MCV: 93 fl (ref 78.0–100.0)
Monocytes Absolute: 0.2 10*3/uL (ref 0.1–1.0)
Monocytes Relative: 4.7 % (ref 3.0–12.0)
Neutro Abs: 1.3 10*3/uL — ABNORMAL LOW (ref 1.4–7.7)
Neutrophils Relative %: 41.1 % — ABNORMAL LOW (ref 43.0–77.0)
Platelets: 308 10*3/uL (ref 150.0–400.0)
RBC: 4.36 Mil/uL (ref 3.87–5.11)
RDW: 13.6 % (ref 11.5–15.5)
WBC: 3.2 10*3/uL — ABNORMAL LOW (ref 4.0–10.5)

## 2020-02-11 LAB — LIPID PANEL
Cholesterol: 243 mg/dL — ABNORMAL HIGH (ref 0–200)
HDL: 84.8 mg/dL (ref >39.00)
NonHDL: 158.61
Total CHOL/HDL Ratio: 3
Triglycerides: 207 mg/dL — ABNORMAL HIGH (ref 0.0–149.0)
VLDL: 41.4 mg/dL — ABNORMAL HIGH (ref 0.0–40.0)

## 2020-02-11 LAB — TSH: TSH: 1.27 u[IU]/mL (ref 0.35–4.50)

## 2020-02-11 LAB — COMPREHENSIVE METABOLIC PANEL
ALT: 67 U/L — ABNORMAL HIGH (ref 0–35)
AST: 36 U/L (ref 0–37)
Albumin: 4.5 g/dL (ref 3.5–5.2)
Alkaline Phosphatase: 118 U/L — ABNORMAL HIGH (ref 39–117)
BUN: 13 mg/dL (ref 6–23)
CO2: 31 mEq/L (ref 19–32)
Calcium: 9.7 mg/dL (ref 8.4–10.5)
Chloride: 101 mEq/L (ref 96–112)
Creatinine, Ser: 0.69 mg/dL (ref 0.40–1.20)
GFR: 90.53 mL/min (ref >60.00)
Glucose, Bld: 146 mg/dL — ABNORMAL HIGH (ref 70–99)
Potassium: 4.5 mEq/L (ref 3.5–5.1)
Sodium: 139 mEq/L (ref 135–145)
Total Bilirubin: 0.5 mg/dL (ref 0.2–1.2)
Total Protein: 6.6 g/dL (ref 6.0–8.3)

## 2020-02-11 LAB — MICROALBUMIN / CREATININE URINE RATIO
Creatinine,U: 120.7 mg/dL
Microalb Creat Ratio: 0.6 mg/g (ref 0.0–30.0)
Microalb, Ur: 0.7 mg/dL (ref 0.0–1.9)

## 2020-02-11 LAB — HEMOGLOBIN A1C: Hgb A1c MFr Bld: 7 % — ABNORMAL HIGH (ref 4.6–6.5)

## 2020-02-11 LAB — LDL CHOLESTEROL, DIRECT: Direct LDL: 106 mg/dL

## 2020-02-11 MED ORDER — BLOOD GLUCOSE MONITOR KIT: PACK | 0 refills | Status: AC

## 2020-02-11 NOTE — Progress Notes (Signed)
50 Subjective:     Claire Hill is a 49 y.o. female and is here for a comprehensive physical exam. The patient reports no problems.  Social History   Socioeconomic History  . Marital status: Married    Spouse name: Jan  . Number of children: 0  . Years of education: Not on file  . Highest education level: Associate degree: academic program  Occupational History  . Occupation: factory Surveyor, quantity  Tobacco Use  . Smoking status: Never Smoker  . Smokeless tobacco: Never Used  Substance and Sexual Activity  . Alcohol use: No  . Drug use: No  . Sexual activity: Not Currently    Partners: Male  Other Topics Concern  . Not on file  Social History Narrative   ** Merged History Encounter **    Exercise-no      Patient is right-handed. She lives with her husband in a one level home, 4 steps to enter. She drinks coffee occasionally. She exercises 4 days a week.   Social Determinants of Health   Financial Resource Strain:   . Difficulty of Paying Living Expenses:   Food Insecurity:   . Worried About Charity fundraiser in the Last Year:   . Arboriculturist in the Last Year:   Transportation Needs:   . Film/video editor (Medical):   Marland Kitchen Lack of Transportation (Non-Medical):   Physical Activity:   . Days of Exercise per Week:   . Minutes of Exercise per Session:   Stress:   . Feeling of Stress :   Social Connections:   . Frequency of Communication with Friends and Family:   . Frequency of Social Gatherings with Friends and Family:   . Attends Religious Services:   . Active Member of Clubs or Organizations:   . Attends Archivist Meetings:   Marland Kitchen Marital Status:   Intimate Partner Violence:   . Fear of Current or Ex-Partner:   . Emotionally Abused:   Marland Kitchen Physically Abused:   . Sexually Abused:    Health Maintenance  Topic Date Due  . HIV Screening  Never done  . COVID-19 Vaccine (1) Never done  . MAMMOGRAM  05/25/2018  . FOOT EXAM  05/15/2019  .  HEMOGLOBIN A1C  11/17/2019  . URINE MICROALBUMIN  11/17/2019  . OPHTHALMOLOGY EXAM  02/25/2020  . INFLUENZA VACCINE  05/24/2020  . PAP SMEAR-Modifier  06/14/2020  . TETANUS/TDAP  10/24/2020  . PNEUMOCOCCAL POLYSACCHARIDE VACCINE AGE 32-64 HIGH RISK  Completed    The following portions of the patient's history were reviewed and updated as appropriate:  She  has a past medical history of Allergy, Anemia, Asthma, COPD (chronic obstructive pulmonary disease) ([place]), Diabetes mellitus, Diabetes mellitus without complication ([place]), Glaucoma, Herpes, Herpes simplex of eye, Hyperlipidemia, and OCD (obsessive compulsive disorder). She does not have any pertinent problems on file. She  has a past surgical history that includes No past surgeries and Mass excision (06/22/2012). Her family history includes Cancer in her cousin and another family member; Diabetes in her father and paternal grandmother; Hypertension in her mother. She  reports that she has never smoked. She has never used smokeless tobacco. She reports that she does not drink alcohol or use drugs. She has a current medication list which includes the following prescription(s): accu-chek aviva plus, accu-chek softclix lancets, aspirin, atorvastatin, azelastine, blood glucose meter kit and supplies, blood glucose meter kit and supplies, brimonidine, brimonidine-timolol, crisaborole, cyclobenzaprine, ezetimibe, famciclovir, famotidine, fluticasone, glimepiride, latanoprost, levocetirizine, metformin,  montelukast, NONFORMULARY OR COMPOUNDED ITEM, nortriptyline, olopatadine, omeprazole, blood glucose meter kit and supplies, and metronidazole. Current Outpatient Medications on File Prior to Visit  Medication Sig Dispense Refill  . ACCU-CHEK AVIVA PLUS test strip USE TO CHECK BLOOD SUGAR FOUR TIMES A DAY 300 strip 1  . Accu-Chek Softclix Lancets lancets USE TO CHECK BLOOD SUGAR 4 TIMES A DAY 300 each 1  . aspirin (ASPIRIN EC) 81 MG EC tablet Take 1 tablet  (81 mg total) by mouth daily. Swallow whole. 30 tablet 12  . atorvastatin (LIPITOR) 80 MG tablet Take 1 tablet (80 mg total) by mouth daily. 90 tablet 3  . azelastine (ASTELIN) 0.1 % nasal spray Take 1-2 sprays twice a day as needed for runny nose. 30 mL 5  . blood glucose meter kit and supplies KIT Use a directed twice a day.  E11.8.  Use Relion Meter 1 each 0  . blood glucose meter kit and supplies KIT Dispense based on patient and insurance preference. Use up to four times daily as directed. (FOR ICD-9 250.00, 250.01). 1 each 0  . brimonidine (ALPHAGAN) 0.2 % ophthalmic solution   98  . brimonidine-timolol (COMBIGAN) 0.2-0.5 % ophthalmic solution Place 1 drop into the right eye every 12 (twelve) hours. Reported on 03/16/2016    . Crisaborole (EUCRISA) 2 % OINT Apply 1 application topically 2 (two) times a day. 100 g 5  . cyclobenzaprine (FLEXERIL) 10 MG tablet TAKE ONE TABLET BY MOUTH EVERY NIGHT AT BEDTIME 30 tablet 0  . ezetimibe (ZETIA) 10 MG tablet TAKE ONE TABLET BY MOUTH DAILY 90 tablet 1  . famciclovir (FAMVIR) 500 MG tablet Take 1 tablet (500 mg total) by mouth 3 (three) times daily. 21 tablet 0  . famotidine (PEPCID) 20 MG tablet TAKE ONE TABLET BY MOUTH TWICE A DAY 60 tablet 0  . fluticasone (FLONASE) 50 MCG/ACT nasal spray Take 1-2 sprays daily for nasal congestion. 16 g 5  . glimepiride (AMARYL) 2 MG tablet TAKE ONE TABLET BY MOUTH EVERY MORNING BEFORE BREAKFAST 180 tablet 1  . latanoprost (XALATAN) 0.005 % ophthalmic solution     . levocetirizine (XYZAL) 5 MG tablet TAKE ONE TABLET BY MOUTH EVERY EVENING 90 tablet 3  . metFORMIN (GLUCOPHAGE-XR) 500 MG 24 hr tablet TAKE TWO TABLETS BY MOUTH DAILY 180 tablet 1  . montelukast (SINGULAIR) 10 MG tablet Take 1 tablet (10 mg total) by mouth at bedtime. 30 tablet 5  . NONFORMULARY OR COMPOUNDED ITEM Alcohol pads  #1 box  Use as directed 1 each 5  . nortriptyline (PAMELOR) 10 MG capsule TAKE ONE CAPSULE BY MOUTH EVERY NIGHT AT BEDTIME 30  capsule 2  . olopatadine (PATANOL) 0.1 % ophthalmic solution Place 1 drop into both eyes 2 (two) times daily as needed for allergies. 5 mL 5  . omeprazole (PRILOSEC) 40 MG capsule TAKE ONE CAPSULE BY MOUTH DAILY 30 capsule 0  . metroNIDAZOLE (FLAGYL) 500 MG tablet Take 1 tablet (500 mg total) by mouth 2 (two) times daily with a meal. DO NOT CONSUME ALCOHOL WHILE TAKING THIS MEDICATION. (Patient not taking: Reported on 02/11/2020) 14 tablet 0   No current facility-administered medications on file prior to visit.   She has No Known Allergies..  Review of Systems Review of Systems  Constitutional: Negative for activity change, appetite change and fatigue.  HENT: Negative for hearing loss, congestion, tinnitus and ear discharge.  dentist q41mEyes: Negative for visual disturbance (see optho q1y -- vision corrected to 20/20 with glasses).  Respiratory: Negative for cough, chest tightness and shortness of breath.   Cardiovascular: Negative for chest pain, palpitations and leg swelling.  Gastrointestinal: Negative for abdominal pain, diarrhea, constipation and abdominal distention.  Genitourinary: Negative for urgency, frequency, decreased urine volume and difficulty urinating.  Musculoskeletal: Negative for back pain, arthralgias and gait problem.  Skin: Negative for color change, pallor and rash.  Neurological: Negative for dizziness, light-headedness, numbness and headaches.  Hematological: Negative for adenopathy. Does not bruise/bleed easily.  Psychiatric/Behavioral: Negative for suicidal ideas, confusion, sleep disturbance, self-injury, dysphoric mood, decreased concentration and agitation.       Objective:    BP 108/78 (BP Location: Right Arm, Patient Position: Sitting, Cuff Size: Normal)   Pulse 86   Temp 97.9 F (36.6 C) (Temporal)   Resp 18   Ht '5\' 8"'$  (1.727 m)   Wt 162 lb (73.5 kg)   LMP  (LMP Unknown) Comment: patient could not remember last period 05/15/17  SpO2 95%   BMI  24.63 kg/m  General appearance: alert, cooperative, appears stated age and no distress Head: Normocephalic, without obvious abnormality, atraumatic Eyes: negative findings: lids and lashes normal and pupils equal, round, reactive to light and accomodation Ears: normal TM's and external ear canals both ears Neck: no adenopathy, no carotid bruit, no JVD, supple, symmetrical, trachea midline and thyroid not enlarged, symmetric, no tenderness/mass/nodules Back: symmetric, no curvature. ROM normal. No CVA tenderness. Lungs: clear to auscultation bilaterally Breasts: normal appearance, no masses or tenderness Heart: regular rate and rhythm, S1, S2 normal, no murmur, click, rub or gallop Abdomen: soft, non-tender; bowel sounds normal; no masses,  no organomegaly Pelvic: deferred Extremities: extremities normal, atraumatic, no cyanosis or edema Pulses: 2+ and symmetric Skin: Skin color, texture, turgor normal. No rashes or lesions Lymph nodes: Cervical, supraclavicular, and axillary nodes normal. Neurologic: Alert and oriented X 3, normal strength and tone. Normal symmetric reflexes. Normal coordination and gait    Assessment:    Healthy female exam.      Plan:    ghm utd Check labs See After Visit Summary for Counseling Recommendations    1. Encounter for gynecological examination without abnormal finding  - Ambulatory referral to Gynecology  2. Uncontrolled type 2 diabetes mellitus with hyperglycemia ([place]) hgba1c to be checked, minimize simple carbs. Increase exercise as tolerated. Continue current meds  - Microalbumin / creatinine urine ratio - Hemoglobin A1c - Comprehensive metabolic panel - blood glucose meter kit and supplies KIT; Dispense based on patient and insurance preference. Use up to four times daily as directed. (FOR ICD-9 250.00, 250.01).  Dispense: 1 each; Refill: 0  3. Essential hypertension Well controlled, no changes to meds. Encouraged heart healthy diet such as  the DASH diet and exercise as tolerated.   4. Hyperlipidemia associated with type 2 diabetes mellitus ([place]) Encouraged heart healthy diet, increase exercise, avoid trans fats, consider a krill oil cap daily - Lipid panel  5. Preventative health care See above  - Lipid panel - Microalbumin / creatinine urine ratio - TSH - CBC with Differential/Platelet - Hemoglobin A1c - Comprehensive metabolic panel  6. Encounter for screening mammogram for malignant neoplasm of breast - MM DIGITAL SCREENING BILATERAL; Future

## 2020-02-11 NOTE — Patient Instructions (Signed)

## 2020-02-16 ENCOUNTER — Other Ambulatory Visit: Payer: Self-pay | Admitting: Family Medicine

## 2020-02-16 DIAGNOSIS — E1165 Type 2 diabetes mellitus with hyperglycemia: Secondary | ICD-10-CM

## 2020-02-16 DIAGNOSIS — E785 Hyperlipidemia, unspecified: Secondary | ICD-10-CM

## 2020-02-16 DIAGNOSIS — E1169 Type 2 diabetes mellitus with other specified complication: Secondary | ICD-10-CM

## 2020-02-16 NOTE — Progress Notes (Signed)
Dm not controlled--- increase metformin 1000 mg bid # 60 Cholesterol--- LDL goal < 70,  HDL >40,  TG < 150.  Diet and exercise will increase HDL and decrease LDL and TG.  Fish,  Fish Oil, Flaxseed oil will also help increase the HDL and decrease Triglycerides.   Recheck labs in 3 months If she is taking the lipitor ---- change to crestor 40 mg daily #30  2 refills .

## 2020-02-18 ENCOUNTER — Other Ambulatory Visit: Payer: Self-pay

## 2020-02-18 MED ORDER — ROSUVASTATIN CALCIUM 40 MG PO TABS: 40.0000 mg | ORAL_TABLET | Freq: Every day | ORAL | 2 refills | Status: DC

## 2020-02-24 ENCOUNTER — Other Ambulatory Visit: Payer: Self-pay | Admitting: Allergy

## 2020-02-24 DIAGNOSIS — J301 Allergic rhinitis due to pollen: Secondary | ICD-10-CM

## 2020-03-04 ENCOUNTER — Other Ambulatory Visit: Payer: Self-pay | Admitting: Family Medicine

## 2020-03-17 ENCOUNTER — Other Ambulatory Visit: Payer: Self-pay | Admitting: Allergy

## 2020-03-26 ENCOUNTER — Other Ambulatory Visit: Payer: Self-pay | Admitting: Family Medicine

## 2020-03-28 ENCOUNTER — Other Ambulatory Visit: Payer: Self-pay | Admitting: Family Medicine

## 2020-04-02 ENCOUNTER — Encounter: Payer: 59 | Admitting: Obstetrics & Gynecology

## 2020-04-15 ENCOUNTER — Other Ambulatory Visit: Payer: Self-pay | Admitting: Neurology

## 2020-04-20 ENCOUNTER — Other Ambulatory Visit: Payer: Self-pay | Admitting: Allergy

## 2020-04-20 DIAGNOSIS — J301 Allergic rhinitis due to pollen: Secondary | ICD-10-CM

## 2020-04-22 ENCOUNTER — Ambulatory Visit: Payer: 59 | Admitting: Allergy

## 2020-04-22 NOTE — Progress Notes (Deleted)
Follow Up Note  RE: Claire Hill MRN: [id number] DOB: 08-09-1971 Date of Office Visit: 04/22/2020  Referring provider: Ann Held, * Primary care provider: Carollee Herter, Alferd Apa, DO  Chief Complaint: No chief complaint on file.  History of Present Illness: I had the pleasure of seeing Claire Hill for a follow up visit at the [facility] [place] [facility] [place] on 04/22/2020. She is a 49 y.o. female, who is being followed for allergic rhinoconjunctivitis, atopic dermatitis, facial flushing, heartburn. Her previous allergy office visit was on 01/22/2020 with Dr. Maudie Mercury. Today is a regular follow up visit.  Seasonal and perennial allergic rhinoconjunctivitis Past history - perennial rhinoconjunctivitis symptoms for the past 2 years. Does not recall previous ENT work-up. 2020 skin testing showed: Positive to grass pollen, weed, ragweed, dust mites and cat.   Interim history - symptoms stable.  Continueenvironmental control measures.  Continue levoceterizine '5mg'$  daily and may take twice a day if needed.  Continue Astelin 1-2 sprays twice a day as needed for drainage.  Continue Flonase 2 sprays daily for nasal congestion.  ContinueSingulair'10mg'$  daily.  May use Patanol 1 drop in each eye twice daily as needed for itchy/watery eyes.  Recommend starting allergy injections.  Check with your insurance about coverage.    Other atopic dermatitis Stable with no issues.  Continue proper skin care measures.  Facial flushing Patient mentions facial flushing which has been ongoing since she was a young child.  Monitor symptoms and follow-up with PCP regarding this.  Heartburn Past history - Patient concerned about food allergies as she noticed clearing of her throat after eating [place] food such as tacos, [place], pork, or salad.  Sometimes she also has emesis episodes.  However she can eat these foods at times without any symptoms.  She does have heartburn and  is on omeprazole currently. 2020 skin testing was negative to foods. Interim history - unchanged, some foods still trigger the reflux.   Continue omeprazole 40 mg dailyand Pepcid 20 mg daily.  Continue reflux lifestyle modification diet.  Vaccine counseling Discussed the risks and benefits of COVID-19 vaccination. No history of anaphylactic or severe reactions to vaccines/medications.  Patient has no contraindications in her medical history.   Recommend getting the COVID-19 vaccine when available.   Return in about 3 months (around 04/22/2020).  Assessment and Plan: Claire V is a 49 y.o. female with: No problem-specific Assessment & Plan notes found for this encounter.  No follow-ups on file.  No orders of the defined types were placed in this encounter.  Lab Orders  No laboratory test(s) ordered today    Diagnostics: Spirometry:  Tracings reviewed. Her effort: {Blank single:19197::"Good reproducible efforts.","It was hard to get consistent efforts and there is a question as to whether this reflects a maximal maneuver.","Poor effort, data can not be interpreted."} FVC: ***L FEV1: ***L, ***% predicted FEV1/FVC ratio: ***% Interpretation: {Blank single:19197::"Spirometry consistent with mild obstructive disease","Spirometry consistent with moderate obstructive disease","Spirometry consistent with severe obstructive disease","Spirometry consistent with possible restrictive disease","Spirometry consistent with mixed obstructive and restrictive disease","Spirometry uninterpretable due to technique","Spirometry consistent with normal pattern","No overt abnormalities noted given today's efforts"}.  Please see scanned spirometry results for details.  Skin Testing: {Blank single:19197::"Select foods","Environmental allergy panel","Environmental allergy panel and select foods","Food allergy panel","None","Deferred due to recent antihistamines use"}. Positive test to: ***. Negative  test to: ***.  Results discussed with patient/family.   Medication List:  Current Outpatient Medications  Medication Sig Dispense Refill   ACCU-CHEK AVIVA  PLUS test strip USE TO CHECK BLOOD SUGAR FOUR TIMES A DAY 300 strip 1   aspirin (ASPIRIN EC) 81 MG EC tablet Take 1 tablet (81 mg total) by mouth daily. Swallow whole. 30 tablet 12   atorvastatin (LIPITOR) 80 MG tablet Take 1 tablet (80 mg total) by mouth daily. 90 tablet 3   azelastine (ASTELIN) 0.1 % nasal spray Take 1-2 sprays twice a day as needed for runny nose. 30 mL 5   blood glucose meter kit and supplies KIT Use a directed twice a day.  E11.8.  Use Relion Meter 1 each 0   blood glucose meter kit and supplies KIT Dispense based on patient and insurance preference. Use up to four times daily as directed. (FOR ICD-9 250.00, 250.01). 1 each 0   blood glucose meter kit and supplies KIT Dispense based on patient and insurance preference. Use up to four times daily as directed. (FOR ICD-9 250.00, 250.01). 1 each 0   brimonidine (ALPHAGAN) 0.2 % ophthalmic solution   98   brimonidine-timolol (COMBIGAN) 0.2-0.5 % ophthalmic solution Place 1 drop into the right eye every 12 (twelve) hours. Reported on 03/16/2016     Crisaborole (EUCRISA) 2 % OINT Apply 1 application topically 2 (two) times a day. 100 g 5   cyclobenzaprine (FLEXERIL) 10 MG tablet TAKE ONE TABLET BY MOUTH EVERY NIGHT AT BEDTIME 30 tablet 0   ezetimibe (ZETIA) 10 MG tablet TAKE ONE TABLET BY MOUTH DAILY 90 tablet 1   famciclovir (FAMVIR) 500 MG tablet Take 1 tablet (500 mg total) by mouth 3 (three) times daily. 21 tablet 0   famotidine (PEPCID) 20 MG tablet TAKE ONE TABLET BY MOUTH TWICE A DAY 60 tablet 0   fluticasone (FLONASE) 50 MCG/ACT nasal spray Take 1-2 sprays daily for nasal congestion. 16 g 5   glimepiride (AMARYL) 2 MG tablet TAKE ONE TABLET BY MOUTH EVERY MORNING BEFORE BREAKFAST 180 tablet 1   Lancets (ONETOUCH DELICA PLUS [id number]) MISC USE TO CHECK  BLOOD GLUCOSE UP TO 4 TIMES A DAY 100 each 0   latanoprost (XALATAN) 0.005 % ophthalmic solution      levocetirizine (XYZAL) 5 MG tablet TAKE ONE TABLET BY MOUTH EVERY EVENING 30 tablet 0   metFORMIN (GLUCOPHAGE-XR) 500 MG 24 hr tablet TAKE TWO TABLETS BY MOUTH DAILY 180 tablet 0   metroNIDAZOLE (FLAGYL) 500 MG tablet Take 1 tablet (500 mg total) by mouth 2 (two) times daily with a meal. DO NOT CONSUME ALCOHOL WHILE TAKING THIS MEDICATION. (Patient not taking: Reported on 02/11/2020) 14 tablet 0   montelukast (SINGULAIR) 10 MG tablet TAKE ONE TABLET BY MOUTH EVERY NIGHT AT BEDTIME 90 tablet 0   NONFORMULARY OR COMPOUNDED ITEM Alcohol pads  #1 box  Use as directed 1 each 5   nortriptyline (PAMELOR) 10 MG capsule TAKE ONE CAPSULE BY MOUTH EVERY NIGHT AT BEDTIME 30 capsule 1   olopatadine (PATANOL) 0.1 % ophthalmic solution Place 1 drop into both eyes 2 (two) times daily as needed for allergies. 5 mL 5   omeprazole (PRILOSEC) 40 MG capsule TAKE ONE CAPSULE BY MOUTH DAILY 30 capsule 0   rosuvastatin (CRESTOR) 40 MG tablet Take 1 tablet (40 mg total) by mouth daily. 30 tablet 2   No current facility-administered medications for this visit.   Allergies: No Known Allergies I reviewed her past medical history, social history, family history, and environmental history and no significant changes have been reported from her previous visit.  Review of Systems  Constitutional:  Negative for appetite change, chills, fever and unexpected weight change.  HENT: Negative for congestion, rhinorrhea and sneezing.   Eyes: Negative for itching.  Respiratory: Negative for cough, chest tightness, shortness of breath and wheezing.   Cardiovascular: Negative for chest pain.  Gastrointestinal: Negative for abdominal pain.  Genitourinary: Negative for difficulty urinating.  Skin: Negative for rash.       Facial flushing  Allergic/Immunologic: Positive for environmental allergies. Negative for food allergies.   Neurological: Negative for headaches.   Objective: LMP  (LMP Unknown) Comment: patient could not remember last period 05/15/17 There is no height or weight on file to calculate BMI. Physical Exam Vitals and nursing note reviewed.  Constitutional:      Appearance: She is well-developed.  HENT:     Head: Normocephalic and atraumatic.     Right Ear: External ear normal.     Left Ear: External ear normal.     Nose: Nose normal.  Eyes:     Conjunctiva/sclera: Conjunctivae normal.  Cardiovascular:     Rate and Rhythm: Normal rate and regular rhythm.     Heart sounds: Normal heart sounds. No murmur heard.  No friction rub. No gallop.   Pulmonary:     Effort: Pulmonary effort is normal.     Breath sounds: Normal breath sounds. No wheezing or rales.  Abdominal:     Palpations: Abdomen is soft.  Musculoskeletal:     Cervical back: Neck supple.  Skin:    General: Skin is warm.     Findings: No rash.  Neurological:     Mental Status: She is alert and oriented to person, place, and time.  Psychiatric:        Behavior: Behavior normal.    Previous notes and tests were reviewed. The plan was reviewed with the patient/family, and all questions/concerned were addressed.  It was my pleasure to see Claire V today and participate in her care. Please feel free to contact me with any questions or concerns.  Sincerely,  Rexene Alberts, DO Allergy & Immunology  [facility] office: [phone] [facility] office: [place] office: [phone]

## 2020-04-27 ENCOUNTER — Other Ambulatory Visit: Payer: Self-pay | Admitting: Family Medicine

## 2020-05-14 ENCOUNTER — Other Ambulatory Visit: Payer: Self-pay | Admitting: Family Medicine

## 2020-05-15 ENCOUNTER — Ambulatory Visit (INDEPENDENT_AMBULATORY_CARE_PROVIDER_SITE_OTHER): Payer: 59 | Admitting: Endocrinology

## 2020-05-15 ENCOUNTER — Encounter: Payer: Self-pay | Admitting: Endocrinology

## 2020-05-15 ENCOUNTER — Other Ambulatory Visit: Payer: Self-pay

## 2020-05-15 VITALS — BP 120/82 | HR 92 | Ht 68.0 in | Wt 166.0 lb

## 2020-05-15 DIAGNOSIS — E119 Type 2 diabetes mellitus without complications: Secondary | ICD-10-CM | POA: Diagnosis not present

## 2020-05-15 LAB — POCT GLYCOSYLATED HEMOGLOBIN (HGB A1C): Hemoglobin A1C: 8.7 % — AB (ref 4.0–5.6)

## 2020-05-15 MED ORDER — RYBELSUS 3 MG PO TABS: 3.0000 mg | ORAL_TABLET | Freq: Every day | ORAL | 11 refills | Status: DC

## 2020-05-15 NOTE — Progress Notes (Signed)
Subjective:    Patient ID: Claire Hill, female    DOB: 1971/01/25, 49 y.o.   MRN: [id number]  HPI I last saw pt in 2017. Pt has DM type: 2 Dx'ed: 5974 Complications: PN Therapy: 2 oral meds GDM: never DKA: never Severe hypoglycemia: never Pancreatitis: never.   Other: she has never been on insulin.   Interval history: pt states she feels well in general.  no cbg record, but states cbg's are in the 300's.   Past Medical History:  Diagnosis Date  . Allergy   . Anemia   . Asthma    pt states no-no inhalers  . COPD (chronic obstructive pulmonary disease) ([place])   . Diabetes mellitus   . Diabetes mellitus without complication ([place])   . Glaucoma   . Herpes    Right eye  . Herpes simplex of eye   . Hyperlipidemia   . OCD (obsessive compulsive disorder)     Past Surgical History:  Procedure Laterality Date  . MASS EXCISION  06/22/2012   Procedure: EXCISION MASS;  Surgeon: Harl Bowie, MD;  Location: [facility];  Service: General;  Laterality: Right;  Excision chronic Right axillary cyst  . NO PAST SURGERIES      Social History   Socioeconomic History  . Marital status: Married    Spouse name: Jan  . Number of children: 0  . Years of education: Not on file  . Highest education level: Associate degree: academic program  Occupational History  . Occupation: factory Surveyor, quantity  Tobacco Use  . Smoking status: Never Smoker  . Smokeless tobacco: Never Used  Vaping Use  . Vaping Use: Never used  Substance and Sexual Activity  . Alcohol use: No  . Drug use: No  . Sexual activity: Not Currently    Partners: Male  Other Topics Concern  . Not on file  Social History Narrative   ** Merged History Encounter **    Exercise-no      Patient is right-handed. She lives with her husband in a one level home, 4 steps to enter. She drinks coffee occasionally. She exercises 4 days a week.   Social Determinants of Health   Financial Resource Strain:    . Difficulty of Paying Living Expenses:   Food Insecurity:   . Worried About Charity fundraiser in the Last Year:   . Arboriculturist in the Last Year:   Transportation Needs:   . Film/video editor (Medical):   Marland Kitchen Lack of Transportation (Non-Medical):   Physical Activity:   . Days of Exercise per Week:   . Minutes of Exercise per Session:   Stress:   . Feeling of Stress :   Social Connections:   . Frequency of Communication with Friends and Family:   . Frequency of Social Gatherings with Friends and Family:   . Attends Religious Services:   . Active Member of Clubs or Organizations:   . Attends Archivist Meetings:   Marland Kitchen Marital Status:   Intimate Partner Violence:   . Fear of Current or Ex-Partner:   . Emotionally Abused:   Marland Kitchen Physically Abused:   . Sexually Abused:     Current Outpatient Medications on File Prior to Visit  Medication Sig Dispense Refill  . ACCU-CHEK AVIVA PLUS test strip USE TO CHECK BLOOD SUGAR FOUR TIMES A DAY 300 strip 1  . aspirin (ASPIRIN EC) 81 MG EC tablet Take 1 tablet (81 mg total) by mouth  daily. Swallow whole. 30 tablet 12  . atorvastatin (LIPITOR) 80 MG tablet Take 1 tablet (80 mg total) by mouth daily. 90 tablet 3  . azelastine (ASTELIN) 0.1 % nasal spray Take 1-2 sprays twice a day as needed for runny nose. 30 mL 5  . blood glucose meter kit and supplies KIT Use a directed twice a day.  E11.8.  Use Relion Meter 1 each 0  . blood glucose meter kit and supplies KIT Dispense based on patient and insurance preference. Use up to four times daily as directed. (FOR ICD-9 250.00, 250.01). 1 each 0  . blood glucose meter kit and supplies KIT Dispense based on patient and insurance preference. Use up to four times daily as directed. (FOR ICD-9 250.00, 250.01). 1 each 0  . brimonidine (ALPHAGAN) 0.2 % ophthalmic solution   98  . brimonidine-timolol (COMBIGAN) 0.2-0.5 % ophthalmic solution Place 1 drop into the right eye every 12 (twelve) hours.  Reported on 03/16/2016    . Crisaborole (EUCRISA) 2 % OINT Apply 1 application topically 2 (two) times a day. 100 g 5  . cyclobenzaprine (FLEXERIL) 10 MG tablet TAKE ONE TABLET BY MOUTH EVERY NIGHT AT BEDTIME 30 tablet 0  . ezetimibe (ZETIA) 10 MG tablet TAKE ONE TABLET BY MOUTH DAILY 90 tablet 1  . famciclovir (FAMVIR) 500 MG tablet Take 1 tablet (500 mg total) by mouth 3 (three) times daily. 21 tablet 0  . famotidine (PEPCID) 20 MG tablet TAKE ONE TABLET BY MOUTH TWICE A DAY 60 tablet 0  . fluticasone (FLONASE) 50 MCG/ACT nasal spray Take 1-2 sprays daily for nasal congestion. 16 g 5  . glimepiride (AMARYL) 2 MG tablet TAKE ONE TABLET BY MOUTH EVERY MORNING BEFORE BREAKFAST 180 tablet 1  . Lancets (ONETOUCH DELICA PLUS [id number]) MISC USE TO CHECK BLOOD GLUCOSE UP TO FOUR TIMES A DAY 100 each 0  . latanoprost (XALATAN) 0.005 % ophthalmic solution     . levocetirizine (XYZAL) 5 MG tablet TAKE ONE TABLET BY MOUTH EVERY EVENING 30 tablet 0  . metFORMIN (GLUCOPHAGE-XR) 500 MG 24 hr tablet TAKE TWO TABLETS BY MOUTH DAILY 180 tablet 0  . montelukast (SINGULAIR) 10 MG tablet TAKE ONE TABLET BY MOUTH EVERY NIGHT AT BEDTIME 90 tablet 0  . NONFORMULARY OR COMPOUNDED ITEM Alcohol pads  #1 box  Use as directed 1 each 5  . nortriptyline (PAMELOR) 10 MG capsule TAKE ONE CAPSULE BY MOUTH EVERY NIGHT AT BEDTIME 30 capsule 1  . olopatadine (PATANOL) 0.1 % ophthalmic solution Place 1 drop into both eyes 2 (two) times daily as needed for allergies. 5 mL 5  . omeprazole (PRILOSEC) 40 MG capsule TAKE ONE CAPSULE BY MOUTH DAILY 30 capsule 0  . rosuvastatin (CRESTOR) 40 MG tablet TAKE ONE TABLET BY MOUTH DAILY 30 tablet 1   No current facility-administered medications on file prior to visit.    No Known Allergies  Family History  Problem Relation Age of Onset  . Diabetes Father   . Hypertension Mother   . Diabetes Paternal Grandmother   . Cancer Other        LUNG, BRAIN AND STOMACH   . Cancer Cousin         ovarian    BP 120/82   Pulse 92   Ht _0  (1.727 m)   Wt 166 lb (75.3 kg)   LMP  (LMP Unknown) Comment: patient could not remember last period 05/15/17  SpO2 97%   BMI 25.24 kg/m  Review of Systems She denies hypoglycemia.     Objective:   Physical Exam VITAL SIGNS:  See vs page GENERAL: no distress Pulses: dorsalis pedis intact bilat.   MSK: no deformity of the feet CV: no leg edema Skin:  no ulcer on the feet.  normal color and temp on the feet. Neuro: sensation is intact to touch on the feet, but decreased from normal.    Lab Results  Component Value Date   HGBA1C 8.7 (A) 05/15/2020   Lab Results  Component Value Date   CREATININE 0.69 02/11/2020   BUN 13 02/11/2020   NA 139 02/11/2020   K 4.5 02/11/2020   CL 101 02/11/2020   CO2 31 02/11/2020       Assessment & Plan:  Type 2 DM: worse.    Patient Instructions  I have sent a prescription to your pharmacy, to add "Rybelsus." Please continue the same other medications. check your blood sugar once a day.  vary the time of day when you check, between before the 3 meals, and at bedtime.  also check if you have symptoms of your blood sugar being too high or too low.  please keep a record of the readings and bring it to your next appointment here (or you can bring the meter itself).  You can write it on any piece of paper.  please call us sooner if your blood sugar goes below 70, or if you have a lot of readings over 200. Please see a dietician specialist.   Please come back for a follow-up appointment in 2 months.     He enviado una receta a su farmacia, para agregar "Rybelsus". Contine con los mismos otros medicamentos. controle su nivel de azcar en sangre una vez al da. Vare la hora del da en que lo comprueba, entre antes de las 3 comidas y antes de [place]. Compruebe tambin si tiene sntomas de que su nivel de Location manager en sangre es demasiado alto o demasiado bajo. por favor mantenga un registro de las  lecturas y Air cabin crew a su prxima cita aqu (o puede traer Nature conservation officer). Puede escribirlo en cualquier hoja de papel. Llmenos antes si su nivel de azcar en sangre es inferior a 77 o si tiene muchas lecturas superiores a 200. Consulte a Science writer. Regrese para una cita de seguimiento en 2 meses.

## 2020-05-15 NOTE — Patient Instructions (Signed)
I have sent a prescription to your pharmacy, to add "Rybelsus." Please continue the same other medications. check your blood sugar once a day.  vary the time of day when you check, between before the 3 meals, and at bedtime.  also check if you have symptoms of your blood sugar being too high or too low.  please keep a record of the readings and bring it to your next appointment here (or you can bring the meter itself).  You can write it on any piece of paper.  please call us sooner if your blood sugar goes below 70, or if you have a lot of readings over 200. Please see a dietician specialist.   Please come back for a follow-up appointment in 2 months.     He enviado una receta a su farmacia, para agregar "Rybelsus". Contine con los mismos otros medicamentos. controle su nivel de azcar en sangre una vez al da. Vare la hora del da en que lo comprueba, entre antes de las 3 comidas y antes de [place]. Compruebe tambin si tiene sntomas de que su nivel de Location manager en sangre es demasiado alto o demasiado bajo. por favor mantenga un registro de las lecturas y Air cabin crew a su prxima cita aqu (o puede traer Nature conservation officer). Puede escribirlo en cualquier hoja de papel. Llmenos antes si su nivel de azcar en sangre es inferior a 109 o si tiene muchas lecturas superiores a 200. Consulte a Science writer. Regrese para una cita de seguimiento en 2 meses.

## 2020-05-17 ENCOUNTER — Other Ambulatory Visit: Payer: Self-pay | Admitting: Allergy

## 2020-05-17 DIAGNOSIS — J301 Allergic rhinitis due to pollen: Secondary | ICD-10-CM

## 2020-05-18 ENCOUNTER — Other Ambulatory Visit: Payer: Self-pay | Admitting: Family Medicine

## 2020-05-19 ENCOUNTER — Other Ambulatory Visit: Payer: Self-pay | Admitting: Family Medicine

## 2020-05-26 NOTE — Progress Notes (Signed)
Follow Up Note  RE: Claire Hill MRN: [id number] DOB: 10-04-1971 Date of Office Visit: 05/27/2020  Referring provider: Ann Held, * Primary care provider: Carollee Herter, Alferd Apa, DO  Chief Complaint: Allergic Rhinitis   History of Present Illness: I had the pleasure of seeing Claire Hill for a follow up visit at the [facility] on 05/27/2020. She is a 49 y.o. female, who is being followed for allergic rhino conjunctivitis, atopic dermatitis, facial flushing, heartburn. Her previous allergy office visit was on 01/22/2020 with Dr. Maudie Mercury. Today is a regular follow up visit. Up to date with COVID-19 vaccine: no  Seasonal and perennial allergic rhinoconjunctivitis Still having some red eyes but it's not itchy. Using eye drops as needed with some benefit. Had recent eye exam - unremarkable per patient report.  Currently on Singulair, Xyzal, Astelin, Flonase and stable with regimen.   Not interested in injections.   Sometimes has issues with right ear fullness.   Other atopic dermatitis Stable.   Facial flushing Still occurring. Did not mention to PCP at last visit.   Heartburn Using omeprazole and Pepcid daily with unknown benefit. Noticing that orange juice sometimes irritates her mouth and reflux. No previous EGD.  Assessment and Plan: Claire Hill is a 49 y.o. female with: Seasonal and perennial allergic rhinoconjunctivitis Past history - perennial rhinoconjunctivitis symptoms for the past 2 years. Does not recall previous ENT work-up. 2020 skin testing showed: Positive to grass pollen, weed, ragweed, dust mites and cat.   Interim history - symptoms stable. Not interested in injections. Had eye exam done.  Continueenvironmental control measures.  Continue levocetirizine 77m daily and may take twice a day if needed.  Continue Astelin 1-2 sprays twice a day as needed for drainage.  Continue Flonase 2 sprays daily for nasal  congestion.  ContinueSingulair161mdaily.  May use Patanol 1 drop in each eye twice daily as needed for itchy/watery eyes.  If not controlled then recommend allergy injections next.   Other atopic dermatitis Stable with no issues.  Continue proper skin care measures.  Heartburn Past history - Patient concerned about food allergies as she noticed clearing of her throat after eating [place] such as tacos, [place], or salad.  Sometimes she also has emesis episodes.  However she can eat these foods at times without any symptoms.  She does have heartburn and is on omeprazole currently. 2020 skin testing was negative to foods. Interim history - still has reflux symptoms. No previous EGD or GI evaluation.   Continue omeprazole 40 mg daily and Pepcid 20 mg daily.  Start reflux lifestyle modification diet - Handout given in Spanish.  Recommend GI referral next if having persistent symptoms.   Facial flushing Past history - Facial flushing which has been ongoing since she was a young child.  Monitor symptoms and follow-up with PCP regarding this.  Return in about 6 months (around 11/27/2020).  Diagnostics: None.  Medication List:  Current Outpatient Medications  Medication Sig Dispense Refill  . aspirin (ASPIRIN EC) 81 MG EC tablet Take 1 tablet (81 mg total) by mouth daily. Swallow whole. 30 tablet 12  . atorvastatin (LIPITOR) 80 MG tablet Take 1 tablet (80 mg total) by mouth daily. 90 tablet 3  . azelastine (ASTELIN) 0.1 % nasal spray Take 1-2 sprays twice a day as needed for runny nose. 30 mL 5  . blood glucose meter kit and supplies KIT Use a directed twice a day.  E11.8.  Use Relion Meter 1 each 0  . blood glucose meter kit and supplies KIT Dispense based on patient and insurance preference. Use up to four times daily as directed. (FOR ICD-9 250.00, 250.01). 1 each 0  . blood glucose meter kit and supplies KIT Dispense based on patient and insurance preference. Use up  to four times daily as directed. (FOR ICD-9 250.00, 250.01). 1 each 0  . brimonidine (ALPHAGAN) 0.2 % ophthalmic solution   98  . brimonidine-timolol (COMBIGAN) 0.2-0.5 % ophthalmic solution Place 1 drop into the right eye every 12 (twelve) hours. Reported on 03/16/2016    . Crisaborole (EUCRISA) 2 % OINT Apply 1 application topically 2 (two) times a day. 100 g 5  . cyclobenzaprine (FLEXERIL) 10 MG tablet TAKE ONE TABLET BY MOUTH EVERY NIGHT AT BEDTIME 30 tablet 0  . ezetimibe (ZETIA) 10 MG tablet TAKE ONE TABLET BY MOUTH DAILY 90 tablet 1  . famciclovir (FAMVIR) 500 MG tablet Take 1 tablet (500 mg total) by mouth 3 (three) times daily. 21 tablet 0  . famotidine (PEPCID) 20 MG tablet TAKE ONE TABLET BY MOUTH TWICE A DAY 60 tablet 0  . fluticasone (FLONASE) 50 MCG/ACT nasal spray Take 1-2 sprays daily for nasal congestion. 16 g 5  . glimepiride (AMARYL) 2 MG tablet TAKE ONE TABLET BY MOUTH EVERY MORNING BEFORE BREAKFAST 180 tablet 1  . Lancets (ONETOUCH DELICA PLUS [id number]) MISC USE TO CHECK BLOOD SUGAR UP TO FOUR TIMES A DAY 100 each 0  . latanoprost (XALATAN) 0.005 % ophthalmic solution     . levocetirizine (XYZAL) 5 MG tablet TAKE ONE TABLET BY MOUTH EVERY EVENING 30 tablet 0  . metFORMIN (GLUCOPHAGE-XR) 500 MG 24 hr tablet TAKE TWO TABLETS BY MOUTH DAILY 180 tablet 0  . montelukast (SINGULAIR) 10 MG tablet TAKE ONE TABLET BY MOUTH EVERY NIGHT AT BEDTIME 90 tablet 0  . NONFORMULARY OR COMPOUNDED ITEM Alcohol pads  #1 box  Use as directed 1 each 5  . nortriptyline (PAMELOR) 10 MG capsule TAKE ONE CAPSULE BY MOUTH EVERY NIGHT AT BEDTIME 30 capsule 1  . olopatadine (PATANOL) 0.1 % ophthalmic solution Place 1 drop into both eyes 2 (two) times daily as needed for allergies. 5 mL 5  . omeprazole (PRILOSEC) 40 MG capsule TAKE ONE CAPSULE BY MOUTH DAILY 30 capsule 0  . ONETOUCH ULTRA test strip USE TO CHECK BLOOD GLUCOSE UP TO 4 TIMES A DAY AS DIRECTED 100 strip 3  . rosuvastatin (CRESTOR) 40 MG  tablet TAKE ONE TABLET BY MOUTH DAILY 30 tablet 1  . Semaglutide (RYBELSUS) 3 MG TABS Take 3 mg by mouth daily. 30 tablet 11  . valACYclovir (VALTREX) 1000 MG tablet Take 1,000 mg by mouth daily.     No current facility-administered medications for this visit.   Allergies: No Known Allergies I reviewed her past medical history, social history, family history, and environmental history and no significant changes have been reported from her previous visit.  Review of Systems  Constitutional: Negative for appetite change, chills, fever and unexpected weight change.  HENT: Negative for congestion, rhinorrhea and sneezing.   Eyes: Negative for itching.  Respiratory: Negative for cough, chest tightness, shortness of breath and wheezing.   Cardiovascular: Negative for chest pain.  Gastrointestinal: Negative for abdominal pain.  Genitourinary: Negative for difficulty urinating.  Skin: Negative for rash.       Facial flushing  Allergic/Immunologic: Positive for environmental allergies. Negative for food allergies.  Neurological: Negative for headaches.  Objective: BP 112/86   Pulse 75   Resp 18   Ht _0  (1.727 m)   LMP  (LMP Unknown) Comment: patient could not remember last period 05/15/17  SpO2 97%   BMI 25.24 kg/m  Body mass index is 25.24 kg/m. Physical Exam Vitals and nursing note reviewed.  Constitutional:      Appearance: Normal appearance. She is well-developed.  HENT:     Head: Normocephalic and atraumatic.     Right Ear: Tympanic membrane and external ear normal.     Left Ear: Tympanic membrane and external ear normal.     Nose: Nose normal.     Mouth/Throat:     Mouth: Mucous membranes are moist.     Pharynx: Oropharynx is clear.  Eyes:     Conjunctiva/sclera: Conjunctivae normal.  Cardiovascular:     Rate and Rhythm: Normal rate and regular rhythm.     Heart sounds: Normal heart sounds. No murmur heard.  No friction rub. No gallop.   Pulmonary:     Effort:  Pulmonary effort is normal.     Breath sounds: Normal breath sounds. No wheezing, rhonchi or rales.  Musculoskeletal:     Cervical back: Neck supple.  Skin:    General: Skin is warm.     Findings: No rash.  Neurological:     Mental Status: She is alert and oriented to person, place, and time.  Psychiatric:        Behavior: Behavior normal.    Previous notes and tests were reviewed. The plan was reviewed with the patient/family, and all questions/concerned were addressed.  It was my pleasure to see Claire Hill today and participate in her care. Please feel free to contact me with any questions or concerns.  Sincerely,  Rexene Alberts, DO Allergy & Immunology  [facility] office: [phone] [facility] office: [place] office: [phone]

## 2020-05-27 ENCOUNTER — Other Ambulatory Visit: Payer: Self-pay

## 2020-05-27 ENCOUNTER — Encounter: Payer: Self-pay | Admitting: Allergy

## 2020-05-27 ENCOUNTER — Ambulatory Visit (INDEPENDENT_AMBULATORY_CARE_PROVIDER_SITE_OTHER): Payer: 59 | Admitting: Allergy

## 2020-05-27 VITALS — BP 112/86 | HR 75 | Resp 18 | Ht 68.0 in

## 2020-05-27 DIAGNOSIS — R12 Heartburn: Secondary | ICD-10-CM | POA: Diagnosis not present

## 2020-05-27 DIAGNOSIS — J302 Other seasonal allergic rhinitis: Secondary | ICD-10-CM

## 2020-05-27 DIAGNOSIS — J3089 Other allergic rhinitis: Secondary | ICD-10-CM

## 2020-05-27 DIAGNOSIS — R232 Flushing: Secondary | ICD-10-CM

## 2020-05-27 DIAGNOSIS — L2089 Other atopic dermatitis: Secondary | ICD-10-CM

## 2020-05-27 DIAGNOSIS — H1010 Acute atopic conjunctivitis, unspecified eye: Secondary | ICD-10-CM

## 2020-05-27 NOTE — Assessment & Plan Note (Signed)
Stable with no issues.  Continue proper skin care measures.

## 2020-05-27 NOTE — Assessment & Plan Note (Signed)
Past history - Patient concerned about food allergies as she noticed clearing of her throat after eating [place] food such as tacos, [place], pork, or salad.  Sometimes she also has emesis episodes.  However she can eat these foods at times without any symptoms.  She does have heartburn and is on omeprazole currently. 2020 skin testing was negative to foods. Interim history - still has reflux symptoms. No previous EGD or GI evaluation.   Continue omeprazole 40 mg daily and Pepcid 20 mg daily.  Start reflux lifestyle modification diet - Handout given in Spanish.  Recommend GI referral next if having persistent symptoms.

## 2020-05-27 NOTE — Patient Instructions (Addendum)
Seasonal and perennial allergic rhinoconjunctivitis 2020 skin testing showed: Positive to grass pollen, weed, ragweed, dust mites and cat.    Continueenvironmental control measures.  Continue levoceterizine 5mg  daily and may take twice a day if needed.  Continue Astelin 1-2 sprays twice a day as needed for drainage.  Continue Flonase 2 sprays daily for nasal congestion.  ContinueSingulair10mg  daily.  May use Patanol 1 drop in each eye twice daily as needed for itchy/watery eyes.  If not controlled then recommend allergy injections next.   Other atopic dermatitis  Continue proper skin care measures.  Heartburn  Continue omeprazole 40 mg dailyand Pepcid 20 mg daily.  Continue reflux lifestyle modification diet as below.  Follow up with PCP regarding this - may need to see a GI specialist.  Follow up in 6 months or sooner if needed.   Skin care recommendations  Bath time: . Always use lukewarm water. AVOID very hot or cold water. Marland Kitchen Keep bathing time to 5-10 minutes. . Do NOT use bubble bath. . Use a mild soap and use just enough to wash the dirty areas. . Do NOT scrub skin vigorously.  . After bathing, pat dry your skin with a towel. Do NOT rub or scrub the skin.  Moisturizers and prescriptions:  . ALWAYS apply moisturizers immediately after bathing (within 3 minutes). This helps to lock-in moisture. . Use the moisturizer several times a day over the whole body. Kermit Balo summer moisturizers include: Aveeno, CeraVe, Cetaphil. Kermit Balo winter moisturizers include: Aquaphor, Vaseline, Cerave, Cetaphil, Eucerin, Vanicream. . When using moisturizers along with medications, the moisturizer should be applied about one hour after applying the medication to prevent diluting effect of the medication or moisturize around where you applied the medications. When not using medications, the moisturizer can be continued twice daily as maintenance.  Laundry and clothing: . Avoid  laundry products with added color or perfumes. . Use unscented hypo-allergenic laundry products such as Tide free, Cheer free & gentle, and All free and clear.  . If the skin still seems dry or sensitive, you can try double-rinsing the clothes. . Avoid tight or scratchy clothing such as wool. . Do not use fabric softeners or dyer sheets.   Acidez estomacal Heartburn La acidez estomacal es un tipo de dolor o Tree surgeon que puede sentirse en la garganta o en el pecho. Con frecuencia se describe como un dolor urente (ardor). Tambin puede causar mal sabor en la boca que se siente cido. La acidez estomacal puede sentirse con mayor intensidad cuando usted se acuesta o se inclina, y a menudo [employer] noche. La acidez estomacal puede deberse al contenido del estmago que sube por el esfago (reflujo). Siga estas indicaciones en su casa: Comida y bebida   Evite ciertos alimentos y bebidas como se lo haya indicado el mdico. Estos pueden incluir: ? Caf y t (con o sin cafena). ? Bebidas que contengan alcohol. ? Bebidas energticas y deportivas. ? Bebidas gaseosas o refrescos. ? Chocolate y cacao. ? Menta y [place]. ? Ajo y cebolla. ? Rbano picante. ? Alimentos condimentados, picantes y cidos, por ejemplo, todos los tipos de pimientas, [place] en polvo, curry en polvo, vinagre, salsas picantes y [employer]. ? Ctricos y sus jugos, por ejemplo, naranjas, limones y limas. ? Alimentos a base de tomate, como salsa de [place], [place], salsa picante y pizza con salsa de [place]. ? Alimentos fritos y [place], Nitro donas, papas fritas y aderezos ricos en grasas. ? Carnes con [employer]  contenido de grasa, Engineer, maintenance (IT), y cortes de carnes rojas y blancas con mucha grasa, por ejemplo, chuletas o costillas, embutidos, jamn y tocino. ? Productos lcteos ricos en grasas, como leche [place], [place] y [place] crema.  Haga comidas pequeas y frecuentes Medical sales representative de comidas  abundantes.  Evite beber grandes cantidades de lquidos con las comidas.  Evite comer 2 o 3horas antes de acostarse.  Evite recostarse inmediatamente despus de comer.  No haga ejercicios enseguida despus de comer. Estilo de vida      Si tiene sobrepeso, baje Librarian, academic a un peso saludable para usted. Pdale consejos al mdico para bajar de peso de [place] segura.  No consuma ningn producto que contenga nicotina o tabaco, como cigarrillos, cigarrillos electrnicos y tabaco de Higher education careers adviser. Estos pueden empeorar los sntomas. Si necesita ayuda para dejar de fumar, consulte al mdico.  Use ropa suelta. No use nada apretado alrededor de la cintura que haga presin sobre el abdomen.  Levante (eleve) la cabecera de la cama aproximadamente 6pulgadas (15cm) para dormir.  Trate de reducir [employer] de estrs con actividades tales como el yoga o la meditacin. Si necesita ayuda para reducir [employer] de estrs, consulte al mdico. Indicaciones generales  Est atento a cualquier cambio en los sntomas.  Tome los medicamentos de venta libre y los recetados solamente como se lo haya indicado el mdico. ? No tome aspirina, ibuprofeno ni otros antiinflamatorios no esteroideos (AINE) a menos que el mdico se lo indique. ? Deje de tomar los [employer] como se lo haya indicado el mdico. Si deja de tomar algunos medicamentos demasiado rpido, los sntomas Scientist, research (medical).  Concurra a todas las visitas de seguimiento como se lo haya indicado el mdico. Esto es importante. Comunquese con un mdico si:  Aparecen nuevos sntomas.  Baja de peso sin causa aparente.  Tiene dificultad para tragar, o le duele cuando traga.  Tiene tos persistente o sibilancias.  Los sntomas no mejoran con Dispensing optician.  Tiene acidez estomacal con frecuencia durante ms de 2semanas. Solicite ayuda inmediatamente si:  [employer], el cuello, la [place], los dientes o la  espalda.  Se siente transpirado, mareado o tiene una sensacin de desvanecimiento.  Siente falta de aire o Tourist information centre manager.  Vomita y el vmito tiene un aspecto similar a la sangre o a los posos de caf.  Las heces son sanguinolentas o negras. Estos sntomas pueden representar un problema grave que constituye Engineer, maintenance (IT). No espere a ver si los sntomas desaparecen. Solicite atencin mdica de inmediato. Comunquese con el servicio de emergencias de su localidad (911 en los [place]). No conduzca por sus propios medios [employer] hospital. Resumen  La acidez estomacal es un tipo de dolor o Tree surgeon que puede sentirse en la garganta o en el pecho. Con frecuencia se describe como un dolor urente (ardor). Tambin puede causar mal sabor en la boca que se siente cido.  Evite ciertos alimentos y bebidas como se lo haya indicado el mdico.  Tome los medicamentos de venta libre y los recetados solamente como se lo haya indicado el mdico. No tome aspirina, ibuprofeno ni otros antiinflamatorios no esteroideos (AINE) a menos que el mdico se lo indique.  Comunquese con un mdico si los sntomas no mejoran o si empeoran. Esta informacin no tiene Marine scientist el consejo del mdico. Asegrese de hacerle al mdico cualquier pregunta que tenga. Document Revised: 04/18/2018 Document Reviewed: 04/18/2018 Elsevier Patient Education  2020 Elsevier Inc.  

## 2020-05-27 NOTE — Assessment & Plan Note (Signed)
Past history - Facial flushing which has been ongoing since she was a young child.  Monitor symptoms and follow-up with PCP regarding this.

## 2020-05-27 NOTE — Assessment & Plan Note (Signed)
Past history - perennial rhinoconjunctivitis symptoms for the past 2 years. Does not recall previous ENT work-up. 2020 skin testing showed: Positive to grass pollen, weed, ragweed, dust mites and cat.   Interim history - symptoms stable. Not interested in injections. Had eye exam done.  Continueenvironmental control measures.  Continue levocetirizine 5mg  daily and may take twice a day if needed.  Continue Astelin 1-2 sprays twice a day as needed for drainage.  Continue Flonase 2 sprays daily for nasal congestion.  ContinueSingulair10mg  daily.  May use Patanol 1 drop in each eye twice daily as needed for itchy/watery eyes.  If not controlled then recommend allergy injections next.

## 2020-06-08 ENCOUNTER — Other Ambulatory Visit: Payer: Self-pay | Admitting: Allergy

## 2020-06-08 ENCOUNTER — Other Ambulatory Visit: Payer: Self-pay | Admitting: Family Medicine

## 2020-06-08 DIAGNOSIS — J301 Allergic rhinitis due to pollen: Secondary | ICD-10-CM

## 2020-06-11 ENCOUNTER — Other Ambulatory Visit: Payer: Self-pay | Admitting: Allergy

## 2020-06-12 ENCOUNTER — Other Ambulatory Visit: Payer: Self-pay | Admitting: Neurology

## 2020-06-24 ENCOUNTER — Other Ambulatory Visit: Payer: Self-pay | Admitting: Family Medicine

## 2020-06-29 ENCOUNTER — Other Ambulatory Visit: Payer: Self-pay | Admitting: Neurology

## 2020-07-08 ENCOUNTER — Other Ambulatory Visit: Payer: Self-pay | Admitting: Neurology

## 2020-07-08 ENCOUNTER — Other Ambulatory Visit: Payer: Self-pay | Admitting: Family Medicine

## 2020-07-13 ENCOUNTER — Other Ambulatory Visit: Payer: Self-pay | Admitting: Family Medicine

## 2020-07-17 ENCOUNTER — Ambulatory Visit (INDEPENDENT_AMBULATORY_CARE_PROVIDER_SITE_OTHER): Payer: 59 | Admitting: Endocrinology

## 2020-07-17 ENCOUNTER — Other Ambulatory Visit: Payer: Self-pay

## 2020-07-17 VITALS — BP 117/78 | HR 85 | Wt 165.0 lb

## 2020-07-17 DIAGNOSIS — E119 Type 2 diabetes mellitus without complications: Secondary | ICD-10-CM | POA: Diagnosis not present

## 2020-07-17 LAB — POCT GLYCOSYLATED HEMOGLOBIN (HGB A1C): Hemoglobin A1C: 6.9 % — AB (ref 4.0–5.6)

## 2020-07-17 MED ORDER — RYBELSUS 7 MG PO TABS: 7.0000 mg | ORAL_TABLET | Freq: Every day | ORAL | 3 refills | Status: DC

## 2020-07-17 MED ORDER — GLIMEPIRIDE 1 MG PO TABS: 1.0000 mg | ORAL_TABLET | Freq: Every day | ORAL | 3 refills | Status: DC

## 2020-07-17 NOTE — Patient Instructions (Signed)
I have sent 2 prescriptions to your pharmacy: to increase the Rybelsus, and to decrease the glimepiride. Please continue the same metformin. check your blood sugar once a day.  vary the time of day when you check, between before the 3 meals, and at bedtime.  also check if you have symptoms of your blood sugar being too high or too low.  please keep a record of the readings and bring it to your next appointment here (or you can bring the meter itself).  You can write it on any piece of paper.  please call us sooner if your blood sugar goes below 70, or if you have a lot of readings over 200. Please come back for a follow-up appointment in 4 months  He enviado 2 recetas a su farmacia: para aumentar el Rybelsus y para disminuir la glimepirida. Contine con la misma metformina. controle su nivel de azcar en sangre una vez al da. Vare la hora del da en que lo comprueba, entre antes de las 3 comidas y antes de [place]. Compruebe tambin si tiene sntomas de que su nivel de Location manager en sangre es demasiado alto o demasiado bajo. por favor mantenga un registro de las lecturas y Air cabin crew a su prxima cita aqu (o puede traer Nature conservation officer). Puede escribirlo en cualquier hoja de papel. Llmenos antes si su nivel de azcar en sangre es inferior a 58 o si tiene muchas lecturas superiores a 200. Regrese para una cita de seguimiento en 4 meses

## 2020-07-17 NOTE — Progress Notes (Signed)
Subjective:    Patient ID: Claire Hill, female    DOB: 1971/04/29, 49 y.o.   MRN: [id number]  HPI Pt returns for f/u of DM DM type: 2 Dx'ed: 2952 Complications: PN Therapy: 3 oral meds GDM: never DKA: never Severe hypoglycemia: never Pancreatitis: never.   Other: she has never been on insulin.   Interval history: pt states she feels well in general.  no cbg record, but states cbg's are well-controlled.  She takes meds as rx'ed.  Past Medical History:  Diagnosis Date  . Allergy   . Anemia   . Asthma    pt states no-no inhalers  . COPD (chronic obstructive pulmonary disease) ([place])   . Diabetes mellitus   . Diabetes mellitus without complication ([place])   . Glaucoma   . Herpes    Right eye  . Herpes simplex of eye   . Hyperlipidemia   . OCD (obsessive compulsive disorder)     Past Surgical History:  Procedure Laterality Date  . MASS EXCISION  06/22/2012   Procedure: EXCISION MASS;  Surgeon: Harl Bowie, MD;  Location: [facility];  Service: General;  Laterality: Right;  Excision chronic Right axillary cyst  . NO PAST SURGERIES      Social History   Socioeconomic History  . Marital status: Married    Spouse name: Jan  . Number of children: 0  . Years of education: Not on file  . Highest education level: Associate degree: academic program  Occupational History  . Occupation: factory Surveyor, quantity  Tobacco Use  . Smoking status: Never Smoker  . Smokeless tobacco: Never Used  Vaping Use  . Vaping Use: Never used  Substance and Sexual Activity  . Alcohol use: No  . Drug use: No  . Sexual activity: Not Currently    Partners: Male  Other Topics Concern  . Not on file  Social History Narrative   ** Merged History Encounter **    Exercise-no      Patient is right-handed. She lives with her husband in a one level home, 4 steps to enter. She drinks coffee occasionally. She exercises 4 days a week.   Social Determinants of Health    Financial Resource Strain:   . Difficulty of Paying Living Expenses: Not on file  Food Insecurity:   . Worried About Charity fundraiser in the Last Year: Not on file  . Ran Out of Food in the Last Year: Not on file  Transportation Needs:   . Lack of Transportation (Medical): Not on file  . Lack of Transportation (Non-Medical): Not on file  Physical Activity:   . Days of Exercise per Week: Not on file  . Minutes of Exercise per Session: Not on file  Stress:   . Feeling of Stress : Not on file  Social Connections:   . Frequency of Communication with Friends and Family: Not on file  . Frequency of Social Gatherings with Friends and Family: Not on file  . Attends Religious Services: Not on file  . Active Member of Clubs or Organizations: Not on file  . Attends Archivist Meetings: Not on file  . Marital Status: Not on file  Intimate Partner Violence:   . Fear of Current or Ex-Partner: Not on file  . Emotionally Abused: Not on file  . Physically Abused: Not on file  . Sexually Abused: Not on file    Current Outpatient Medications on File Prior to Visit  Medication Sig Dispense  Refill  . aspirin (ASPIRIN EC) 81 MG EC tablet Take 1 tablet (81 mg total) by mouth daily. Swallow whole. 30 tablet 12  . azelastine (ASTELIN) 0.1 % nasal spray Take 1-2 sprays twice a day as needed for runny nose. 30 mL 5  . blood glucose meter kit and supplies KIT Use a directed twice a day.  E11.8.  Use Relion Meter 1 each 0  . blood glucose meter kit and supplies KIT Dispense based on patient and insurance preference. Use up to four times daily as directed. (FOR ICD-9 250.00, 250.01). 1 each 0  . blood glucose meter kit and supplies KIT Dispense based on patient and insurance preference. Use up to four times daily as directed. (FOR ICD-9 250.00, 250.01). 1 each 0  . brimonidine (ALPHAGAN) 0.2 % ophthalmic solution   98  . brimonidine-timolol (COMBIGAN) 0.2-0.5 % ophthalmic solution Place 1 drop  into the right eye every 12 (twelve) hours. Reported on 03/16/2016    . Crisaborole (EUCRISA) 2 % OINT Apply 1 application topically 2 (two) times a day. 100 g 5  . cyclobenzaprine (FLEXERIL) 10 MG tablet TAKE ONE TABLET BY MOUTH EVERY NIGHT AT BEDTIME 30 tablet 0  . ezetimibe (ZETIA) 10 MG tablet TAKE ONE TABLET BY MOUTH DAILY 90 tablet 1  . famciclovir (FAMVIR) 500 MG tablet Take 1 tablet (500 mg total) by mouth 3 (three) times daily. 21 tablet 0  . famotidine (PEPCID) 20 MG tablet TAKE ONE TABLET BY MOUTH TWICE A DAY 60 tablet 0  . fluticasone (FLONASE) 50 MCG/ACT nasal spray Take 1-2 sprays daily for nasal congestion. 16 g 5  . Lancets (ONETOUCH DELICA PLUS [id number]) MISC USE TO CHECK BLOOD SUGAR UP TO FOUR TIMES A DAY 100 each 0  . latanoprost (XALATAN) 0.005 % ophthalmic solution     . levocetirizine (XYZAL) 5 MG tablet TAKE ONE TABLET BY MOUTH EVERY EVENING 30 tablet 5  . metFORMIN (GLUCOPHAGE-XR) 500 MG 24 hr tablet TAKE TWO TABLETS BY MOUTH DAILY 180 tablet 0  . montelukast (SINGULAIR) 10 MG tablet TAKE ONE TABLET BY MOUTH EVERY NIGHT AT BEDTIME 90 tablet 0  . NONFORMULARY OR COMPOUNDED ITEM Alcohol pads  #1 box  Use as directed 1 each 5  . nortriptyline (PAMELOR) 10 MG capsule TAKE ONE CAPSULE BY MOUTH EVERY NIGHT AT BEDTIME 30 capsule 1  . olopatadine (PATANOL) 0.1 % ophthalmic solution Place 1 drop into both eyes 2 (two) times daily as needed for allergies. 5 mL 5  . omeprazole (PRILOSEC) 40 MG capsule TAKE ONE CAPSULE BY MOUTH DAILY 30 capsule 0  . ONETOUCH ULTRA test strip USE TO CHECK BLOOD GLUCOSE UP TO 4 TIMES A DAY AS DIRECTED 100 strip 3  . valACYclovir (VALTREX) 1000 MG tablet Take 1,000 mg by mouth daily.     No current facility-administered medications on file prior to visit.    No Known Allergies  Family History  Problem Relation Age of Onset  . Diabetes Father   . Hypertension Mother   . Diabetes Paternal Grandmother   . Cancer Other        LUNG, BRAIN AND  STOMACH   . Cancer Cousin        ovarian    BP 117/78   Pulse 85   Wt 165 lb (74.8 kg)   LMP  (LMP Unknown) Comment: patient could not remember last period 05/15/17  SpO2 98%   BMI 25.09 kg/m    Review of Systems She denies hypoglycemia  and nausea.      Objective:   Physical Exam VITAL SIGNS:  See vs page GENERAL: no distress Pulses: dorsalis pedis intact bilat.   MSK: no deformity of the feet CV: no leg edema Skin:  no ulcer on the feet.  normal color and temp on the feet. Neuro: sensation is intact to touch on the feet    Lab Results  Component Value Date   HGBA1C 6.9 (A) 07/17/2020       Assessment & Plan:  Type 2 DM: overcontrolled, for this SU-containing regimen.   Patient Instructions  I have sent 2 prescriptions to your pharmacy: to increase the Rybelsus, and to decrease the glimepiride. Please continue the same metformin. check your blood sugar once a day.  vary the time of day when you check, between before the 3 meals, and at bedtime.  also check if you have symptoms of your blood sugar being too high or too low.  please keep a record of the readings and bring it to your next appointment here (or you can bring the meter itself).  You can write it on any piece of paper.  please call us sooner if your blood sugar goes below 70, or if you have a lot of readings over 200. Please come back for a follow-up appointment in 4 months  He enviado 2 recetas a su farmacia: para aumentar el Rybelsus y para disminuir la glimepirida. Contine con la misma metformina. controle su nivel de azcar en sangre una vez al da. Vare la hora del da en que lo comprueba, entre antes de las 3 comidas y antes de [place]. Compruebe tambin si tiene sntomas de que su nivel de Location manager en sangre es demasiado alto o demasiado bajo. por favor mantenga un registro de las lecturas y Air cabin crew a su prxima cita aqu (o puede traer Nature conservation officer). Puede escribirlo en cualquier hoja de papel. Llmenos  antes si su nivel de azcar en sangre es inferior a 63 o si tiene muchas lecturas superiores a 200. Regrese para una cita de seguimiento en 4 meses

## 2020-07-20 ENCOUNTER — Other Ambulatory Visit: Payer: Self-pay | Admitting: Family Medicine

## 2020-07-20 ENCOUNTER — Other Ambulatory Visit: Payer: Self-pay | Admitting: Neurology

## 2020-08-13 ENCOUNTER — Ambulatory Visit: Payer: 59 | Admitting: Family Medicine

## 2020-08-14 ENCOUNTER — Other Ambulatory Visit: Payer: Self-pay | Admitting: Family Medicine

## 2020-08-14 ENCOUNTER — Other Ambulatory Visit: Payer: Self-pay | Admitting: Allergy

## 2020-09-13 ENCOUNTER — Other Ambulatory Visit: Payer: Self-pay | Admitting: Family Medicine

## 2020-09-30 ENCOUNTER — Other Ambulatory Visit: Payer: Self-pay

## 2020-09-30 ENCOUNTER — Ambulatory Visit: Payer: 59 | Admitting: Allergy

## 2020-09-30 ENCOUNTER — Ambulatory Visit (INDEPENDENT_AMBULATORY_CARE_PROVIDER_SITE_OTHER): Payer: 59 | Admitting: Internal Medicine

## 2020-09-30 ENCOUNTER — Encounter: Payer: Self-pay | Admitting: Internal Medicine

## 2020-09-30 VITALS — BP 106/73 | HR 82 | Temp 97.9°F | Resp 16 | Ht 68.0 in | Wt 160.1 lb

## 2020-09-30 DIAGNOSIS — L308 Other specified dermatitis: Secondary | ICD-10-CM | POA: Diagnosis not present

## 2020-09-30 MED ORDER — HYDROCORTISONE 2.5 % EX CREA: TOPICAL_CREAM | Freq: Two times a day (BID) | CUTANEOUS | 0 refills | Status: AC

## 2020-09-30 NOTE — Progress Notes (Signed)
Pre visit review using our clinic review tool, if applicable. No additional management support is needed unless otherwise documented below in the visit note. 

## 2020-09-30 NOTE — Progress Notes (Signed)
Subjective:    Patient ID: Claire Hill, female    DOB: 1971-05-16, 49 y.o.   MRN: [id number]  DOS:  09/30/2020 Type of visit - description: Acute The patient developed a skin problem around both eyes: Very dry, sometimes itchy and crackly skin around the eyes, this is going on for a week, it was  worse 3 days ago. 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 Her vision is at baseline. No fever chills  Review of Systems See above   Past Medical History:  Diagnosis Date  . Allergy   . Anemia   . Asthma    pt states no-no inhalers  . COPD (chronic obstructive pulmonary disease) ([place])   . Diabetes mellitus   . Diabetes mellitus without complication ([place])   . Glaucoma   . Herpes    Right eye  . Herpes simplex of eye   . Hyperlipidemia   . OCD (obsessive compulsive disorder)     Past Surgical History:  Procedure Laterality Date  . MASS EXCISION  06/22/2012   Procedure: EXCISION MASS;  Surgeon: Harl Bowie, MD;  Location: [facility];  Service: General;  Laterality: Right;  Excision chronic Right axillary cyst  . NO PAST SURGERIES      Allergies as of 09/30/2020   No Known Allergies     Medication List       Accurate as of September 30, 2020 11:59 PM. If you have any questions, ask your nurse or doctor.        STOP taking these medications   famciclovir 500 MG tablet Commonly known as: FAMVIR Stopped by: Kathlene November, MD     TAKE these medications   aspirin 81 MG EC tablet Commonly known as: aspirin EC Take 1 tablet (81 mg total) by mouth daily. Swallow whole.   azelastine 0.1 % nasal spray Commonly known as: ASTELIN Take 1-2 sprays twice a day as needed for runny nose.   blood glucose meter kit and supplies Kit Use a directed twice a day.  E11.8.  Use Relion Meter   blood glucose meter kit and supplies Kit Dispense based on patient and insurance preference. Use up to four  times daily as directed. (FOR ICD-9 250.00, 250.01).   blood glucose meter kit and supplies Kit Dispense based on patient and insurance preference. Use up to four times daily as directed. (FOR ICD-9 250.00, 250.01).   brimonidine 0.2 % ophthalmic solution Commonly known as: ALPHAGAN   brimonidine-timolol 0.2-0.5 % ophthalmic solution Commonly known as: COMBIGAN Place 1 drop into the right eye every 12 (twelve) hours. Reported on 03/16/2016   Crisaborole 2 % Oint Commonly known as: [place] Apply 1 application topically 2 (two) times a day.   cyclobenzaprine 10 MG tablet Commonly known as: FLEXERIL TAKE ONE TABLET BY MOUTH EVERY NIGHT AT BEDTIME   ezetimibe 10 MG tablet Commonly known as: ZETIA TAKE ONE TABLET BY MOUTH DAILY   famotidine 20 MG tablet Commonly known as: PEPCID TAKE ONE TABLET BY MOUTH TWICE A DAY   fluticasone 50 MCG/ACT nasal spray Commonly known as: FLONASE Take 1-2 sprays daily for nasal congestion.   GenTeal Severe 0.3 % Gel ophthalmic ointment Generic drug: hypromellose Place 1 application into both eyes.   glimepiride 1 MG tablet Commonly known as: AMARYL Take 1 tablet (1 mg total) by mouth daily with breakfast.   hydrocortisone 2.5 % cream Apply topically 2 (two) times daily.  Started by: Kathlene November, MD   ICAPS Tabs Take by mouth.   latanoprost 0.005 % ophthalmic solution Commonly known as: XALATAN   levocetirizine 5 MG tablet Commonly known as: XYZAL TAKE ONE TABLET BY MOUTH EVERY EVENING   metFORMIN 500 MG 24 hr tablet Commonly known as: GLUCOPHAGE-XR TAKE TWO TABLETS BY MOUTH DAILY   montelukast 10 MG tablet Commonly known as: SINGULAIR TAKE ONE TABLET BY MOUTH EVERY NIGHT AT BEDTIME   NONFORMULARY OR COMPOUNDED ITEM Alcohol pads  #1 box  Use as directed   nortriptyline 10 MG capsule Commonly known as: PAMELOR TAKE ONE CAPSULE BY MOUTH EVERY NIGHT AT BEDTIME   olopatadine 0.1 % ophthalmic solution Commonly known as:  PATANOL Place 1 drop into both eyes 2 (two) times daily as needed for allergies.   omeprazole 40 MG capsule Commonly known as: PRILOSEC TAKE ONE CAPSULE BY MOUTH DAILY   OneTouch Delica Plus [id number] Misc USE TO CHECK BLOOD SUGAR UP TO FOUR TIMES A DAY   OneTouch Ultra test strip Generic drug: glucose blood USE TO CHECK BLOOD GLUCOSE UP TO 4 TIMES A DAY AS DIRECTED   rosuvastatin 40 MG tablet Commonly known as: CRESTOR Take 1 tablet (40 mg total) by mouth daily.   Rybelsus 7 MG Tabs Generic drug: Semaglutide Take 7 mg by mouth daily.   valACYclovir 1000 MG tablet Commonly known as: VALTREX Take 1,000 mg by mouth daily.          Objective:   Physical Exam BP 106/73 (BP Location: Right Arm, Patient Position: Sitting, Cuff Size: Normal)   Pulse 82   Temp 97.9 F (36.6 C) (Oral)   Resp 16   Ht 5' 8" (1.727 m)   Wt 160 lb 2 oz (72.6 kg)   LMP  (LMP Unknown) Comment: patient could not remember last period 05/15/17  SpO2 97%   BMI 24.35 kg/m  General:   Well developed, NAD, BMI noted. HEENT:  Normocephalic . Face symmetric, atraumatic Skin around both eyes is dry, slightly swollen, slightly warm, crackling. EOMI. Pupils equal and reactive. On simple inspection there is a very small whitish lesion at the left cornea, apparently chronic per patient.  Delete next lower extremities: no pretibial edema bilaterally  Skin: Not pale. Not jaundice Neurologic:  alert & oriented X3.  Speech normal, gait appropriate for age and unassisted Psych--  Cognition and judgment appear intact.  Cooperative with normal attention span and concentration.  Behavior appropriate. No anxious or depressed appearing.           Assessment      49 year old female, PMH DM, high cholesterol, atopic dermatitis, heartburn, on multiple medications, glaucoma, on Valtrex "for an eye condition" (chronic keratitis?), presents with:  Eczema: Severe eczema around both eyes in the context of  apparently chronic keratitis and glaucoma. At this point does not seem to have preseptal cellulitis but she is at risk. Plan: Recommend to see her allergist and eye doctor ASAP.  Unable to call or make a referral because she does not know the name of her providers   Hydrocortisone 2.5% topically twice a day, small amounts to prevent it to going to her eyes. Call  if swelling increases, fever or chills. All d/w pt in [place], patient verbalized understanding (Addendum: States she has already an appointment to see her allergist today at 3 PM)   This visit occurred during the SARS-CoV-2 public health emergency.  Safety protocols were in place, including screening questions prior to the visit, additional  usage of staff PPE, and extensive cleaning of exam room while observing appropriate contact time as indicated for disinfecting solutions.

## 2020-09-30 NOTE — Patient Instructions (Addendum)
pongase la crema alrededor [facility] ojos 2 o 3 veces al dia (sin que entre a los ojos)  llame de inmediato a su Social worker y doctor de los ojos , debe ser vista esta semana  llamenos si se pone peor o  tiene Systems analyst

## 2020-09-30 NOTE — Progress Notes (Deleted)
Follow Up Note  RE: Claire Hill MRN: [id number] DOB: 1970-12-31 Date of Office Visit: 09/30/2020  Referring provider: Ann Held, * Primary care provider: Carollee Herter, Alferd Apa, DO  Chief Complaint: No chief complaint on file.  History of Present Illness: I had the pleasure of seeing Claire Hill for a follow up visit at the [facility] on 09/30/2020. She is a 49 y.o. female, who is being followed for allergic rhinoconjunctivitis, atopic dermatitis, heartburn. Her previous allergy office visit was on 05/27/2020 with Dr. Maudie Mercury. Today is a new complaint visit of eye issues.  Seasonal and perennial allergic rhinoconjunctivitis Past history - perennial rhinoconjunctivitis symptoms for the past 2 years. Does not recall previous ENT work-up. 2020 skin testing showed: Positive to grass pollen, weed, ragweed, dust mites and cat.   Interim history - symptoms stable. Not interested in injections. Had eye exam done.  Continueenvironmental control measures.  Continue levocetirizine 22m daily and may take twice a day if needed.  Continue Astelin 1-2 sprays twice a day as needed for drainage.  Continue Flonase 2 sprays daily for nasal congestion.  ContinueSingulair160mdaily.  May use Patanol 1 drop in each eye twice daily as needed for itchy/watery eyes.  If not controlled then recommend allergy injections next.   Other atopic dermatitis Stable with no issues.  Continue proper skin care measures.  Heartburn Past history - Patient concerned about food allergies as she noticed clearing of her throat after eating Me[place]ood such as tacos, tu[place]uwaitpork, or salad.  Sometimes she also has emesis episodes.  However she can eat these foods at times without any symptoms.  She does have heartburn and is on omeprazole currently. 2020 skin testing was negative to foods. Interim history - still has reflux symptoms. No previous EGD or GI evaluation.    Continue omeprazole 40 mg dailyand Pepcid 20 mg daily.  Start reflux lifestyle modification diet - Handout given in Spanish.  Recommend GI referral next if having persistent symptoms.   Facial flushing Past history - Facial flushing which has been ongoing since she was a young child.  Monitor symptoms and follow-up with PCP regarding this.  Return in about 6 months (around 11/27/2020).  Assessment and Plan: Claire Hill is a [age].o. female with: No problem-specific Assessment & Plan notes found for this encounter.  No follow-ups on file.  No orders of the defined types were placed in this encounter.  Lab Orders  No laboratory test(s) ordered today    Diagnostics: Spirometry:  Tracings reviewed. Her effort: {Blank single:19197::"Good reproducible efforts.","It was hard to get consistent efforts and there is a question as to whether this reflects a maximal maneuver.","Poor effort, data can not be interpreted."} FVC: ***L FEV1: ***L, ***% predicted FEV1/FVC ratio: ***% Interpretation: {Blank single:19197::"Spirometry consistent with mild obstructive disease","Spirometry consistent with moderate obstructive disease","Spirometry consistent with severe obstructive disease","Spirometry consistent with possible restrictive disease","Spirometry consistent with mixed obstructive and restrictive disease","Spirometry uninterpretable due to technique","Spirometry consistent with normal pattern","No overt abnormalities noted given today's efforts"}.  Please see scanned spirometry results for details.  Skin Testing: {Blank single:19197::"Select foods","Environmental allergy panel","Environmental allergy panel and select foods","Food allergy panel","None","Deferred due to recent antihistamines use"}. Positive test to: ***. Negative test to: ***.  Results discussed with patient/family.   Medication List:  Current Outpatient Medications  Medication Sig Dispense Refill  . aspirin (ASPIRIN  EC) 81 MG EC tablet Take 1 tablet (81 mg total) by mouth daily. Swallow whole.  30 tablet 12  . azelastine (ASTELIN) 0.1 % nasal spray Take 1-2 sprays twice a day as needed for runny nose. 30 mL 5  . blood glucose meter kit and supplies KIT Use a directed twice a day.  E11.8.  Use Relion Meter 1 each 0  . blood glucose meter kit and supplies KIT Dispense based on patient and insurance preference. Use up to four times daily as directed. (FOR ICD-9 250.00, 250.01). 1 each 0  . blood glucose meter kit and supplies KIT Dispense based on patient and insurance preference. Use up to four times daily as directed. (FOR ICD-9 250.00, 250.01). 1 each 0  . brimonidine (ALPHAGAN) 0.2 % ophthalmic solution   98  . brimonidine-timolol (COMBIGAN) 0.2-0.5 % ophthalmic solution Place 1 drop into the right eye every 12 (twelve) hours. Reported on 03/16/2016    . Crisaborole (EUCRISA) 2 % OINT Apply 1 application topically 2 (two) times a day. 100 g 5  . cyclobenzaprine (FLEXERIL) 10 MG tablet TAKE ONE TABLET BY MOUTH EVERY NIGHT AT BEDTIME 30 tablet 0  . ezetimibe (ZETIA) 10 MG tablet TAKE ONE TABLET BY MOUTH DAILY 90 tablet 1  . famciclovir (FAMVIR) 500 MG tablet Take 1 tablet (500 mg total) by mouth 3 (three) times daily. 21 tablet 0  . famotidine (PEPCID) 20 MG tablet TAKE ONE TABLET BY MOUTH TWICE A DAY 60 tablet 0  . fluticasone (FLONASE) 50 MCG/ACT nasal spray Take 1-2 sprays daily for nasal congestion. 16 g 5  . glimepiride (AMARYL) 1 MG tablet Take 1 tablet (1 mg total) by mouth daily with breakfast. 90 tablet 3  . Lancets (ONETOUCH DELICA PLUS [id number]) MISC USE TO CHECK BLOOD SUGAR UP TO FOUR TIMES A DAY 100 each 0  . latanoprost (XALATAN) 0.005 % ophthalmic solution     . levocetirizine (XYZAL) 5 MG tablet TAKE ONE TABLET BY MOUTH EVERY EVENING 30 tablet 5  . metFORMIN (GLUCOPHAGE-XR) 500 MG 24 hr tablet TAKE TWO TABLETS BY MOUTH DAILY 180 tablet 0  . montelukast (SINGULAIR) 10 MG tablet TAKE ONE TABLET BY  MOUTH EVERY NIGHT AT BEDTIME 90 tablet 0  . NONFORMULARY OR COMPOUNDED ITEM Alcohol pads  #1 box  Use as directed 1 each 5  . nortriptyline (PAMELOR) 10 MG capsule TAKE ONE CAPSULE BY MOUTH EVERY NIGHT AT BEDTIME 30 capsule 1  . olopatadine (PATANOL) 0.1 % ophthalmic solution Place 1 drop into both eyes 2 (two) times daily as needed for allergies. 5 mL 5  . omeprazole (PRILOSEC) 40 MG capsule TAKE ONE CAPSULE BY MOUTH DAILY 30 capsule 0  . ONETOUCH ULTRA test strip USE TO CHECK BLOOD GLUCOSE UP TO 4 TIMES A DAY AS DIRECTED 100 strip 3  . rosuvastatin (CRESTOR) 40 MG tablet Take 1 tablet (40 mg total) by mouth daily. 30 tablet 0  . Semaglutide (RYBELSUS) 7 MG TABS Take 7 mg by mouth daily. 90 tablet 3  . valACYclovir (VALTREX) 1000 MG tablet Take 1,000 mg by mouth daily.     No current facility-administered medications for this visit.   Allergies: No Known Allergies I reviewed her past medical history, social history, family history, and environmental history and no significant changes have been reported from her previous visit.  Review of Systems  Constitutional: Negative for appetite change, chills, fever and unexpected weight change.  HENT: Negative for congestion, rhinorrhea and sneezing.   Eyes: Negative for itching.  Respiratory: Negative for cough, chest tightness, shortness of breath and wheezing.  Cardiovascular: Negative for chest pain.  Gastrointestinal: Negative for abdominal pain.  Genitourinary: Negative for difficulty urinating.  Skin: Negative for rash.       Facial flushing  Allergic/Immunologic: Positive for environmental allergies. Negative for food allergies.  Neurological: Negative for headaches.   Objective: LMP  (LMP Unknown) Comment: patient could not remember last period 05/15/17 There is no height or weight on file to calculate BMI. Physical Exam Vitals and nursing note reviewed.  Constitutional:      Appearance: Normal appearance. She is well-developed.   HENT:     Head: Normocephalic and atraumatic.     Right Ear: Tympanic membrane and external ear normal.     Left Ear: Tympanic membrane and external ear normal.     Nose: Nose normal.     Mouth/Throat:     Mouth: Mucous membranes are moist.     Pharynx: Oropharynx is clear.  Eyes:     Conjunctiva/sclera: Conjunctivae normal.  Cardiovascular:     Rate and Rhythm: Normal rate and regular rhythm.     Heart sounds: Normal heart sounds. No murmur heard.  No friction rub. No gallop.   Pulmonary:     Effort: Pulmonary effort is normal.     Breath sounds: Normal breath sounds. No wheezing, rhonchi or rales.  Musculoskeletal:     Cervical back: Neck supple.  Skin:    General: Skin is warm.     Findings: No rash.  Neurological:     Mental Status: She is alert and oriented to person, place, and time.  Psychiatric:        Behavior: Behavior normal.    Previous notes and tests were reviewed. The plan was reviewed with the patient/family, and all questions/concerned were addressed.  It was my pleasure to see Missey today and participate in her care. Please feel free to contact me with any questions or concerns.  Sincerely,  Rexene Alberts, DO Allergy & Immunology  [facility] office: [place] office: [phone]

## 2020-11-12 ENCOUNTER — Other Ambulatory Visit: Payer: Self-pay

## 2020-11-16 ENCOUNTER — Ambulatory Visit (INDEPENDENT_AMBULATORY_CARE_PROVIDER_SITE_OTHER): Payer: Self-pay | Admitting: Endocrinology

## 2020-11-16 ENCOUNTER — Other Ambulatory Visit: Payer: Self-pay

## 2020-11-16 VITALS — BP 120/80 | HR 86 | Ht 68.0 in | Wt 154.6 lb

## 2020-11-16 DIAGNOSIS — E119 Type 2 diabetes mellitus without complications: Secondary | ICD-10-CM

## 2020-11-16 LAB — POCT GLYCOSYLATED HEMOGLOBIN (HGB A1C): Hemoglobin A1C: 5.8 % — AB (ref 4.0–5.6)

## 2020-11-16 MED ORDER — GLIMEPIRIDE 1 MG PO TABS: 0.5000 mg | ORAL_TABLET | Freq: Every day | ORAL | 3 refills | Status: DC

## 2020-11-16 NOTE — Patient Instructions (Addendum)
I have sent a prescription to your pharmacy, to decrease the glimepiride. Please continue the same other medications check your blood sugar once a day.  vary the time of day when you check, between before the 3 meals, and at bedtime.  also check if you have symptoms of your blood sugar being too high or too low.  please keep a record of the readings and bring it to your next appointment here (or you can bring the meter itself).  You can write it on any piece of paper.  please call us sooner if your blood sugar goes below 70, or if you have a lot of readings over 200. Please come back for a follow-up appointment in 4 months.

## 2020-11-16 NOTE — Progress Notes (Signed)
Subjective:    Patient ID: Claire Hill, female    DOB: September 14, 1971, 50 y.o.   MRN: [id number]  HPI Pt returns for f/u of DM DM type: 2 Dx'ed: 2130 Complications: PN Therapy: 3 oral meds GDM: never DKA: never Severe hypoglycemia: never Pancreatitis: never.   Other: she has never been on insulin.   Interval history: pt states she feels well in general, except for slight heartburn.  she brings her meter with her cbg's which I have reviewed today.  cbg varies from 92-332. She takes meds as rx'ed.   Past Medical History:  Diagnosis Date  . Allergy   . Anemia   . Asthma    pt states no-no inhalers  . COPD (chronic obstructive pulmonary disease) ([place])   . Diabetes mellitus   . Diabetes mellitus without complication ([place])   . Glaucoma   . Herpes    Right eye  . Herpes simplex of eye   . Hyperlipidemia   . OCD (obsessive compulsive disorder)     Past Surgical History:  Procedure Laterality Date  . MASS EXCISION  06/22/2012   Procedure: EXCISION MASS;  Surgeon: Harl Bowie, MD;  Location: [facility];  Service: General;  Laterality: Right;  Excision chronic Right axillary cyst  . NO PAST SURGERIES      Social History   Socioeconomic History  . Marital status: Married    Spouse name: Jan  . Number of children: 0  . Years of education: Not on file  . Highest education level: Associate degree: academic program  Occupational History  . Occupation: factory Surveyor, quantity  Tobacco Use  . Smoking status: Never Smoker  . Smokeless tobacco: Never Used  Vaping Use  . Vaping Use: Never used  Substance and Sexual Activity  . Alcohol use: No  . Drug use: No  . Sexual activity: Not Currently    Partners: Male  Other Topics Concern  . Not on file  Social History Narrative   ** Merged History Encounter **    Exercise-no      Patient is right-handed. She lives with her husband in a one level home, 4 steps to enter. She drinks coffee occasionally. She  exercises 4 days a week.   Social Determinants of Health   Financial Resource Strain: Not on file  Food Insecurity: Not on file  Transportation Needs: Not on file  Physical Activity: Not on file  Stress: Not on file  Social Connections: Not on file  Intimate Partner Violence: Not on file    Current Outpatient Medications on File Prior to Visit  Medication Sig Dispense Refill  . aspirin (ASPIRIN EC) 81 MG EC tablet Take 1 tablet (81 mg total) by mouth daily. Swallow whole. 30 tablet 12  . azelastine (ASTELIN) 0.1 % nasal spray Take 1-2 sprays twice a day as needed for runny nose. 30 mL 5  . blood glucose meter kit and supplies KIT Use a directed twice a day.  E11.8.  Use Relion Meter 1 each 0  . blood glucose meter kit and supplies KIT Dispense based on patient and insurance preference. Use up to four times daily as directed. (FOR ICD-9 250.00, 250.01). 1 each 0  . blood glucose meter kit and supplies KIT Dispense based on patient and insurance preference. Use up to four times daily as directed. (FOR ICD-9 250.00, 250.01). 1 each 0  . brimonidine (ALPHAGAN) 0.2 % ophthalmic solution   98  . brimonidine-timolol (COMBIGAN) 0.2-0.5 % ophthalmic  solution Place 1 drop into the right eye every 12 (twelve) hours. Reported on 03/16/2016    . Crisaborole (EUCRISA) 2 % OINT Apply 1 application topically 2 (two) times a day. 100 g 5  . cyclobenzaprine (FLEXERIL) 10 MG tablet TAKE ONE TABLET BY MOUTH EVERY NIGHT AT BEDTIME 30 tablet 0  . ezetimibe (ZETIA) 10 MG tablet TAKE ONE TABLET BY MOUTH DAILY 90 tablet 1  . famotidine (PEPCID) 20 MG tablet TAKE ONE TABLET BY MOUTH TWICE A DAY 60 tablet 0  . fluticasone (FLONASE) 50 MCG/ACT nasal spray Take 1-2 sprays daily for nasal congestion. 16 g 5  . hydrocortisone 2.5 % cream Apply topically 2 (two) times daily. 30 g 0  . hypromellose (GENTEAL SEVERE) 0.3 % GEL ophthalmic ointment Place 1 application into both eyes.    . Lancets (ONETOUCH DELICA PLUS  [id number]) MISC USE TO CHECK BLOOD SUGAR UP TO FOUR TIMES A DAY 100 each 0  . latanoprost (XALATAN) 0.005 % ophthalmic solution     . levocetirizine (XYZAL) 5 MG tablet TAKE ONE TABLET BY MOUTH EVERY EVENING 30 tablet 5  . metFORMIN (GLUCOPHAGE-XR) 500 MG 24 hr tablet TAKE TWO TABLETS BY MOUTH DAILY 180 tablet 0  . montelukast (SINGULAIR) 10 MG tablet TAKE ONE TABLET BY MOUTH EVERY NIGHT AT BEDTIME 90 tablet 0  . Multiple Vitamins-Minerals (ICAPS) TABS Take by mouth.    . NONFORMULARY OR COMPOUNDED ITEM Alcohol pads  #1 box  Use as directed 1 each 5  . nortriptyline (PAMELOR) 10 MG capsule TAKE ONE CAPSULE BY MOUTH EVERY NIGHT AT BEDTIME 30 capsule 1  . olopatadine (PATANOL) 0.1 % ophthalmic solution Place 1 drop into both eyes 2 (two) times daily as needed for allergies. 5 mL 5  . omeprazole (PRILOSEC) 40 MG capsule TAKE ONE CAPSULE BY MOUTH DAILY 30 capsule 0  . ONETOUCH ULTRA test strip USE TO CHECK BLOOD GLUCOSE UP TO 4 TIMES A DAY AS DIRECTED 100 strip 3  . rosuvastatin (CRESTOR) 40 MG tablet Take 1 tablet (40 mg total) by mouth daily. 30 tablet 0  . Semaglutide (RYBELSUS) 7 MG TABS Take 7 mg by mouth daily. 90 tablet 3  . valACYclovir (VALTREX) 1000 MG tablet Take 1,000 mg by mouth daily.     No current facility-administered medications on file prior to visit.    No Known Allergies  Family History  Problem Relation Age of Onset  . Diabetes Father   . Hypertension Mother   . Diabetes Paternal Grandmother   . Cancer Other        LUNG, BRAIN AND STOMACH   . Cancer Cousin        ovarian    BP 120/80 (BP Location: Right Arm, Patient Position: Sitting, Cuff Size: Normal)   Pulse 86   Ht $R'5\' 8"'bm$  (1.727 m)   Wt 154 lb 9.6 oz (70.1 kg)   LMP  (LMP Unknown) Comment: patient could not remember last period 05/15/17  SpO2 98%   BMI 23.51 kg/m   Review of Systems She has lost a few lbs.     Objective:   Physical Exam VITAL SIGNS:  See vs page GENERAL: no distress Pulses: dorsalis  pedis intact bilat.   MSK: no deformity of the feet CV: no leg edema Skin:  no ulcer on the feet.  normal color and temp on the feet. Neuro: sensation is intact to touch on the feet  Lab Results  Component Value Date   HGBA1C 5.8 (A) 11/16/2020  Assessment & Plan:  GERD, due to Rybelsus: we can't increase, at least for now.   Type 2 DM: overcontrolled.    Patient Instructions  I have sent a prescription to your pharmacy, to decrease the glimepiride. Please continue the same other medications check your blood sugar once a day.  vary the time of day when you check, between before the 3 meals, and at bedtime.  also check if you have symptoms of your blood sugar being too high or too low.  please keep a record of the readings and bring it to your next appointment here (or you can bring the meter itself).  You can write it on any piece of paper.  please call us sooner if your blood sugar goes below 70, or if you have a lot of readings over 200. Please come back for a follow-up appointment in 4 months.

## 2020-11-30 ENCOUNTER — Ambulatory Visit: Payer: 59 | Admitting: Allergy

## 2020-12-31 NOTE — Progress Notes (Signed)
Follow Up Note  RE: Claire Hill MRN: [id number] DOB: January 01, 1971 Date of Office Visit: 01/01/2021  Referring provider: Ann Held, * Primary care provider: Carollee Herter, Alferd Apa, DO  Chief Complaint: Allergic Rhinitis  (Always itchy all over, hands, neck, and eyes for the past 3 weeks she was red in the breast area thinks its because of new bra )  History of Present Illness: I had the pleasure of seeing Claire Hill for a follow up visit at the [facility] on 01/02/2021. She is a 50 y.o. female, who is being followed for allergic rhino conjunctivitis, atopic dermatitis, heartburn and facial flushing. Her previous allergy office visit was on 05/27/2020 with Dr. Maudie Mercury. Today is a regular follow up visit.  Seasonal and perennial allergic rhinoconjunctivitis She went to the gas station and someone had a strong lotion/scent which made her itchy on her eyes and arms recently.   Currently not on an daily oral medications as the pharmacy said there were no refills for her on file.   Using eye drops given by her eye doctor - xalatan with good benefit.   Currently using Flonase 2 sprays daily with good benefit. Using azelastine nasal spray only if needed.   Other atopic dermatitis Having some rash on the arm.  Using some type of over the counter cream which she does not know the name of.  Complaining of very dry hands as well.   Heartburn Not on any medications anymore and denies having any symptoms.   Patient had difficulty identifying the names of her medications.   Assessment and Plan: Claire Hill is a 50 y.o. female with: Seasonal and perennial allergic rhinoconjunctivitis Past history - perennial rhinoconjunctivitis symptoms for the past 2 years. Does not recall previous ENT work-up. 2020 skin testing showed: Positive to grass pollen, weed, ragweed, dust mites and cat.   Interim history - only using nasal sprays now. Wants refill on oral  meds.  Continueenvironmental control measures.  May take levoceterizine 23m daily for allergies if needed.   Use Astelin 1-2 sprays twice a day as needed for drainage.  Use Flonase 2 sprays daily for nasal congestion - keep follow up with ophthalmology due to her glaucoma. May need to wean off steroid nasal sprays if worsening glaucoma.   ContinueSingulair12mdaily at night.   If not controlled then recommend allergy injections next.   Other atopic dermatitis Dry hands.   Continue proper skin care measures.  May use Eucrisa (crisaborole) 2% ointment twice a day on the hands. Samples given.  If it burns, place the medication in the refrigerator.  Apply a thin layer of moisturizer and then apply the Eucrisa on top of it.  Heartburn Past history - Patient concerned about food allergies as she noticed clearing of her throat after eating [place] such as tacos, tu[place]pork, or salad.  Sometimes she also has emesis episodes.  However she can eat these foods at times without any symptoms.  She does have heartburn and is on omeprazole currently. 2020 skin testing was negative to foods. No previous EGD or GI evaluation.  Interim history - she stopped all reflux medications now and denies symptoms.   Continue reflux lifestyle modification diet as below.  Return in about 4 months (around 05/03/2021).  ADVISED patient to bring all her medications to her next visit as there is some confusion as to which ones she is actually taking at this time.  Declined Spanish  interpreter during the visit.   Meds ordered this encounter  Medications  . montelukast (SINGULAIR) 10 MG tablet    Sig: Take 1 tablet (10 mg total) by mouth at bedtime.    Dispense:  90 tablet    Refill:  2  . levocetirizine (XYZAL) 5 MG tablet    Sig: Take 1 tablet (5 mg total) by mouth every evening.    Dispense:  90 tablet    Refill:  2  . Crisaborole (EUCRISA) 2 % OINT    Sig: Apply twice a day on the hands     Dispense:  60 g    Refill:  5   Lab Orders  No laboratory test(s) ordered today    Diagnostics: None.  Medication List:  Current Outpatient Medications  Medication Sig Dispense Refill  . aspirin (ASPIRIN EC) 81 MG EC tablet Take 1 tablet (81 mg total) by mouth daily. Swallow whole. 30 tablet 12  . azelastine (ASTELIN) 0.1 % nasal spray Take 1-2 sprays twice a day as needed for runny nose. 30 mL 5  . blood glucose meter kit and supplies KIT Use a directed twice a day.  E11.8.  Use Relion Meter 1 each 0  . blood glucose meter kit and supplies KIT Dispense based on patient and insurance preference. Use up to four times daily as directed. (FOR ICD-9 250.00, 250.01). 1 each 0  . blood glucose meter kit and supplies KIT Dispense based on patient and insurance preference. Use up to four times daily as directed. (FOR ICD-9 250.00, 250.01). 1 each 0  . brimonidine (ALPHAGAN) 0.2 % ophthalmic solution   98  . brimonidine-timolol (COMBIGAN) 0.2-0.5 % ophthalmic solution Place 1 drop into the right eye every 12 (twelve) hours. Reported on 03/16/2016    . Crisaborole (EUCRISA) 2 % OINT Apply twice a day on the hands 60 g 5  . cyclobenzaprine (FLEXERIL) 10 MG tablet TAKE ONE TABLET BY MOUTH EVERY NIGHT AT BEDTIME 30 tablet 0  . ezetimibe (ZETIA) 10 MG tablet TAKE ONE TABLET BY MOUTH DAILY 90 tablet 1  . famotidine (PEPCID) 20 MG tablet TAKE ONE TABLET BY MOUTH TWICE A DAY 60 tablet 0  . fluticasone (FLONASE) 50 MCG/ACT nasal spray Take 1-2 sprays daily for nasal congestion. 16 g 5  . glimepiride (AMARYL) 1 MG tablet Take 0.5 tablets (0.5 mg total) by mouth daily with breakfast. 45 tablet 3  . hydrocortisone 2.5 % cream Apply topically 2 (two) times daily. 30 g 0  . hypromellose (GENTEAL SEVERE) 0.3 % GEL ophthalmic ointment Place 1 application into both eyes.    . Lancets (ONETOUCH DELICA PLUS [id number]) MISC USE TO CHECK BLOOD SUGAR UP TO FOUR TIMES A DAY 100 each 0  . latanoprost (XALATAN) 0.005 %  ophthalmic solution     . levocetirizine (XYZAL) 5 MG tablet Take 1 tablet (5 mg total) by mouth every evening. 90 tablet 2  . metFORMIN (GLUCOPHAGE-XR) 500 MG 24 hr tablet TAKE TWO TABLETS BY MOUTH DAILY 180 tablet 0  . montelukast (SINGULAIR) 10 MG tablet Take 1 tablet (10 mg total) by mouth at bedtime. 90 tablet 2  . Multiple Vitamins-Minerals (ICAPS) TABS Take by mouth.    . NONFORMULARY OR COMPOUNDED ITEM Alcohol pads  #1 box  Use as directed 1 each 5  . nortriptyline (PAMELOR) 10 MG capsule TAKE ONE CAPSULE BY MOUTH EVERY NIGHT AT BEDTIME 30 capsule 1  . olopatadine (PATANOL) 0.1 % ophthalmic solution Place 1 drop into both  eyes 2 (two) times daily as needed for allergies. 5 mL 5  . omeprazole (PRILOSEC) 40 MG capsule TAKE ONE CAPSULE BY MOUTH DAILY 30 capsule 0  . ONETOUCH ULTRA test strip USE TO CHECK BLOOD GLUCOSE UP TO 4 TIMES A DAY AS DIRECTED 100 strip 3  . rosuvastatin (CRESTOR) 40 MG tablet Take 1 tablet (40 mg total) by mouth daily. 30 tablet 0  . Semaglutide (RYBELSUS) 7 MG TABS Take 7 mg by mouth daily. 90 tablet 3  . valACYclovir (VALTREX) 1000 MG tablet Take 1,000 mg by mouth daily.     No current facility-administered medications for this visit.   Allergies: No Known Allergies I reviewed her past medical history, social history, family history, and environmental history and no significant changes have been reported from her previous visit.  Review of Systems  Constitutional: Negative for appetite change, chills, fever and unexpected weight change.  HENT: Negative for congestion, rhinorrhea and sneezing.   Eyes: Negative for itching.  Respiratory: Negative for cough, chest tightness, shortness of breath and wheezing.   Cardiovascular: Negative for chest pain.  Gastrointestinal: Negative for abdominal pain.  Genitourinary: Negative for difficulty urinating.  Skin: Positive for rash.  Allergic/Immunologic: Positive for environmental allergies. Negative for food  allergies.  Neurological: Negative for headaches.   Objective: BP 128/88   Pulse 89   Temp 97.8 F (36.6 C)   Resp 18   Ht _0  (1.727 m)   Wt 155 lb (70.3 kg)   LMP  (LMP Unknown) Comment: patient could not remember last period 05/15/17  SpO2 97%   BMI 23.57 kg/m  Body mass index is 23.57 kg/m. Physical Exam Vitals and nursing note reviewed.  Constitutional:      Appearance: Normal appearance. She is well-developed.  HENT:     Head: Normocephalic and atraumatic.     Right Ear: Tympanic membrane and external ear normal.     Left Ear: Tympanic membrane and external ear normal.     Nose: Nose normal.     Mouth/Throat:     Mouth: Mucous membranes are moist.     Pharynx: Oropharynx is clear.  Eyes:     Conjunctiva/sclera: Conjunctivae normal.  Cardiovascular:     Rate and Rhythm: Normal rate and regular rhythm.     Heart sounds: Normal heart sounds. No murmur heard. No friction rub. No gallop.   Pulmonary:     Effort: Pulmonary effort is normal.     Breath sounds: Normal breath sounds. No wheezing, rhonchi or rales.  Musculoskeletal:     Cervical back: Neck supple.  Skin:    General: Skin is warm and dry.     Findings: No rash.     Comments: Very dry hands b/l.  Neurological:     Mental Status: She is alert and oriented to person, place, and time.  Psychiatric:        Behavior: Behavior normal.    Previous notes and tests were reviewed. The plan was reviewed with the patient/family, and all questions/concerned were addressed.  It was my pleasure to see Claire Hill today and participate in her care. Please feel free to contact me with any questions or concerns.  Sincerely,  Rexene Alberts, DO Allergy & Immunology  [facility] office: [place] office: [phone]

## 2021-01-01 ENCOUNTER — Other Ambulatory Visit: Payer: Self-pay

## 2021-01-01 ENCOUNTER — Encounter: Payer: Self-pay | Admitting: Allergy

## 2021-01-01 ENCOUNTER — Ambulatory Visit (INDEPENDENT_AMBULATORY_CARE_PROVIDER_SITE_OTHER): Payer: 59 | Admitting: Allergy

## 2021-01-01 VITALS — BP 128/88 | HR 89 | Temp 97.8°F | Resp 18 | Ht 68.0 in | Wt 155.0 lb

## 2021-01-01 DIAGNOSIS — H1013 Acute atopic conjunctivitis, bilateral: Secondary | ICD-10-CM

## 2021-01-01 DIAGNOSIS — R12 Heartburn: Secondary | ICD-10-CM | POA: Diagnosis not present

## 2021-01-01 DIAGNOSIS — R232 Flushing: Secondary | ICD-10-CM | POA: Diagnosis not present

## 2021-01-01 DIAGNOSIS — L2089 Other atopic dermatitis: Secondary | ICD-10-CM

## 2021-01-01 DIAGNOSIS — H1010 Acute atopic conjunctivitis, unspecified eye: Secondary | ICD-10-CM

## 2021-01-01 DIAGNOSIS — J302 Other seasonal allergic rhinitis: Secondary | ICD-10-CM | POA: Diagnosis not present

## 2021-01-01 DIAGNOSIS — J3089 Other allergic rhinitis: Secondary | ICD-10-CM

## 2021-01-01 DIAGNOSIS — J301 Allergic rhinitis due to pollen: Secondary | ICD-10-CM

## 2021-01-01 MED ORDER — MONTELUKAST SODIUM 10 MG PO TABS: 10.0000 mg | ORAL_TABLET | Freq: Every day | ORAL | 2 refills | Status: DC

## 2021-01-01 MED ORDER — LEVOCETIRIZINE DIHYDROCHLORIDE 5 MG PO TABS: 5.0000 mg | ORAL_TABLET | Freq: Every evening | ORAL | 2 refills | Status: DC

## 2021-01-01 MED ORDER — EUCRISA 2 % EX OINT: TOPICAL_OINTMENT | CUTANEOUS | 5 refills | Status: DC

## 2021-01-01 NOTE — Patient Instructions (Addendum)
Bring all your medications to your next visit.  Seasonal and perennial allergic rhinoconjunctivitis 2020 skin testing showed: Positive to grass pollen, weed, ragweed, dust mites and cat.    Continueenvironmental control measures.  May take levoceterizine 5mg  daily for allergies if needed.   Use Astelin 1-2 sprays twice a day as needed for drainage.  May use Flonase 2 sprays daily for nasal congestion.  ContinueSingulair10mg  daily at night.   If not controlled then recommend allergy injections next.   Other atopic dermatitis  Continue proper skin care measures.  May use Eucrisa (crisaborole) 2% ointment twice a day on the hands. Samples given.  If it burns, place the medication in the refrigerator.  Apply a thin layer of moisturizer and then apply the Eucrisa on top of it.  Heartburn  Continue reflux lifestyle modification diet as below.  Follow up in 4 months or sooner if needed.   Skin care recommendations  Bath time: . Always use lukewarm water. AVOID very hot or cold water. Marland Kitchen Keep bathing time to 5-10 minutes. . Do NOT use bubble bath. . Use a mild soap and use just enough to wash the dirty areas. . Do NOT scrub skin vigorously.  . After bathing, pat dry your skin with a towel. Do NOT rub or scrub the skin.  Moisturizers and prescriptions:  . ALWAYS apply moisturizers immediately after bathing (within 3 minutes). This helps to lock-in moisture. . Use the moisturizer several times a day over the whole body. Kermit Balo summer moisturizers include: Aveeno, CeraVe, Cetaphil. Kermit Balo winter moisturizers include: Aquaphor, Vaseline, Cerave, Cetaphil, Eucerin, Vanicream. . When using moisturizers along with medications, the moisturizer should be applied about one hour after applying the medication to prevent diluting effect of the medication or moisturize around where you applied the medications. When not using medications, the moisturizer can be continued twice daily as  maintenance.  Laundry and clothing: . Avoid laundry products with added color or perfumes. . Use unscented hypo-allergenic laundry products such as Tide free, Cheer free & gentle, and All free and clear.  . If the skin still seems dry or sensitive, you can try double-rinsing the clothes. . Avoid tight or scratchy clothing such as wool. . Do not use fabric softeners or dyer sheets.   Acidez estomacal Heartburn La acidez estomacal es un tipo de dolor o Tree surgeon que puede sentirse en la garganta o en el pecho. Con frecuencia se describe como un dolor urente (ardor). Tambin puede causar mal sabor en la boca que se siente cido. La acidez estomacal puede sentirse con mayor intensidad cuando usted se acuesta o se inclina, y a menudo [employer] noche. La acidez estomacal puede deberse al contenido del estmago que sube por el esfago (reflujo). Siga estas indicaciones en su casa: Comida y bebida   Evite ciertos alimentos y bebidas como se lo haya indicado el mdico. Estos pueden incluir: ? Caf y t (con o sin cafena). ? Bebidas que contengan alcohol. ? Bebidas energticas y deportivas. ? Bebidas gaseosas o refrescos. ? Chocolate y cacao. ? Menta y [place]. ? Ajo y cebolla. ? Rbano picante. ? Alimentos condimentados, picantes y cidos, por ejemplo, todos los tipos de pimientas, [place] en polvo, curry en polvo, vinagre, salsas picantes y [employer]. ? Ctricos y sus jugos, por ejemplo, naranjas, limones y limas. ? Alimentos a base de tomate, como salsa de [place], [place], salsa picante y pizza con salsa de [place]. ? Alimentos fritos y [place], [place] donas,  papas fritas y aderezos ricos en grasas. ? Carnes con alto contenido de grasa, como salchichas, y cortes de carnes rojas y blancas con mucha grasa, por ejemplo, chuletas o costillas, embutidos, jamn y tocino. ? Productos lcteos ricos en grasas, como leche [place], [place] y [place] crema.  Haga comidas pequeas y  frecuentes Medical sales representative de comidas abundantes.  Evite beber grandes cantidades de lquidos con las comidas.  Evite comer 2 o 3horas antes de acostarse.  Evite recostarse inmediatamente despus de comer.  No haga ejercicios enseguida despus de comer. Estilo de vida      Si tiene sobrepeso, baje Librarian, academic a un peso saludable para usted. Pdale consejos al mdico para bajar de peso de [place] segura.  No consuma ningn producto que contenga nicotina o tabaco, como cigarrillos, cigarrillos electrnicos y tabaco de Higher education careers adviser. Estos pueden empeorar los sntomas. Si necesita ayuda para dejar de fumar, consulte al mdico.  Use ropa suelta. No use nada apretado alrededor de la cintura que haga presin sobre el abdomen.  Levante (eleve) la cabecera de la cama aproximadamente 6pulgadas (15cm) para dormir.  Trate de reducir [employer] de estrs con actividades tales como el yoga o la meditacin. Si necesita ayuda para reducir [employer] de estrs, consulte al mdico. Indicaciones generales  Est atento a cualquier cambio en los sntomas.  Tome los medicamentos de venta libre y los recetados solamente como se lo haya indicado el mdico. ? No tome aspirina, ibuprofeno ni otros antiinflamatorios no esteroideos (AINE) a menos que el mdico se lo indique. ? Deje de tomar los [employer] como se lo haya indicado el mdico. Si deja de tomar algunos medicamentos demasiado rpido, los sntomas Scientist, research (medical).  Concurra a todas las visitas de seguimiento como se lo haya indicado el mdico. Esto es importante. Comunquese con un mdico si:  Aparecen nuevos sntomas.  Baja de peso sin causa aparente.  Tiene dificultad para tragar, o le duele cuando traga.  Tiene tos persistente o sibilancias.  Los sntomas no mejoran con Dispensing optician.  Tiene acidez estomacal con frecuencia durante ms de 2semanas. Solicite ayuda inmediatamente si:  [employer], el  cuello, la [place], los dientes o la espalda.  Se siente transpirado, mareado o tiene una sensacin de desvanecimiento.  Siente falta de aire o Tourist information centre manager.  Vomita y el vmito tiene un aspecto similar a la sangre o a los posos de caf.  Las heces son sanguinolentas o negras. Estos sntomas pueden representar un problema grave que constituye Engineer, maintenance (IT). No espere a ver si los sntomas desaparecen. Solicite atencin mdica de inmediato. Comunquese con el servicio de emergencias de su localidad (911 en los [place]). No conduzca por sus propios medios [employer] hospital. Resumen  La acidez estomacal es un tipo de dolor o Tree surgeon que puede sentirse en la garganta o en el pecho. Con frecuencia se describe como un dolor urente (ardor). Tambin puede causar mal sabor en la boca que se siente cido.  Evite ciertos alimentos y bebidas como se lo haya indicado el mdico.  Tome los medicamentos de venta libre y los recetados solamente como se lo haya indicado el mdico. No tome aspirina, ibuprofeno ni otros antiinflamatorios no esteroideos (AINE) a menos que el mdico se lo indique.  Comunquese con un mdico si los sntomas no mejoran o si empeoran. Esta informacin no tiene Marine scientist el consejo del mdico. Asegrese de hacerle al mdico cualquier pregunta que  tenga. Document Revised: 04/18/2018 Document Reviewed: 04/18/2018 Elsevier Patient Education  [place].

## 2021-01-02 ENCOUNTER — Encounter: Payer: Self-pay | Admitting: Allergy

## 2021-01-02 NOTE — Assessment & Plan Note (Signed)
Past history - Patient concerned about food allergies as she noticed clearing of her throat after eating [place] food such as tacos, [place], pork, or salad.  Sometimes she also has emesis episodes.  However she can eat these foods at times without any symptoms.  She does have heartburn and is on omeprazole currently. 2020 skin testing was negative to foods. No previous EGD or GI evaluation.  Interim history - she stopped all reflux medications now and denies symptoms.   Continue reflux lifestyle modification diet as below.

## 2021-01-02 NOTE — Assessment & Plan Note (Signed)
Dry hands.   Continue proper skin care measures.  May use Eucrisa (crisaborole) 2% ointment twice a day on the hands. Samples given.  If it burns, place the medication in the refrigerator.  Apply a thin layer of moisturizer and then apply the Eucrisa on top of it.

## 2021-01-02 NOTE — Assessment & Plan Note (Addendum)
Past history - perennial rhinoconjunctivitis symptoms for the past 2 years. Does not recall previous ENT work-up. 2020 skin testing showed: Positive to grass pollen, weed, ragweed, dust mites and cat.   Interim history - only using nasal sprays now. Wants refill on oral meds.  Continueenvironmental control measures.  May take levoceterizine 5mg  daily for allergies if needed.   Use Astelin 1-2 sprays twice a day as needed for drainage.  Use Flonase 2 sprays daily for nasal congestion - keep follow up with ophthalmology due to her glaucoma. May need to wean off steroid nasal sprays if worsening glaucoma.   ContinueSingulair10mg  daily at night.   If not controlled then recommend allergy injections next.

## 2021-01-18 ENCOUNTER — Telehealth: Payer: Self-pay | Admitting: Allergy

## 2021-01-18 NOTE — Telephone Encounter (Signed)
Spoke with steve he is going to fax over the paper for the pa for [facility] will fill it out and fax to [facility]

## 2021-01-18 NOTE — Telephone Encounter (Signed)
[facility] called to check on a prior authorization for [place]. Call back number: [phone]

## 2021-01-18 NOTE — Telephone Encounter (Signed)
Lm for steve at [facility] to call me back about this

## 2021-01-21 NOTE — Telephone Encounter (Signed)
[facility] called to check on PA status. Please call them at [phone] with an update.

## 2021-01-22 NOTE — Telephone Encounter (Signed)
refaxed paperwork to [facility]

## 2021-02-26 ENCOUNTER — Other Ambulatory Visit: Payer: Self-pay

## 2021-02-26 DIAGNOSIS — E119 Type 2 diabetes mellitus without complications: Secondary | ICD-10-CM

## 2021-02-26 MED ORDER — METFORMIN HCL ER 500 MG PO TB24: 1000.0000 mg | ORAL_TABLET | Freq: Every day | ORAL | 0 refills | Status: DC

## 2021-03-10 ENCOUNTER — Other Ambulatory Visit: Payer: Self-pay

## 2021-03-10 ENCOUNTER — Ambulatory Visit (INDEPENDENT_AMBULATORY_CARE_PROVIDER_SITE_OTHER): Payer: 59 | Admitting: Medical

## 2021-03-10 VITALS — BP 101/76 | HR 95 | Resp 17 | Ht 68.0 in | Wt 155.2 lb

## 2021-03-10 DIAGNOSIS — L089 Local infection of the skin and subcutaneous tissue, unspecified: Secondary | ICD-10-CM | POA: Diagnosis not present

## 2021-03-10 DIAGNOSIS — L723 Sebaceous cyst: Secondary | ICD-10-CM | POA: Diagnosis not present

## 2021-03-10 DIAGNOSIS — N951 Menopausal and female climacteric states: Secondary | ICD-10-CM | POA: Diagnosis not present

## 2021-03-10 DIAGNOSIS — R5383 Other fatigue: Secondary | ICD-10-CM

## 2021-03-10 DIAGNOSIS — R519 Headache, unspecified: Secondary | ICD-10-CM | POA: Diagnosis not present

## 2021-03-10 DIAGNOSIS — E1165 Type 2 diabetes mellitus with hyperglycemia: Secondary | ICD-10-CM | POA: Diagnosis not present

## 2021-03-10 MED ORDER — DOXYCYCLINE HYCLATE 100 MG PO TABS: 100.0000 mg | ORAL_TABLET | Freq: Two times a day (BID) | ORAL | 0 refills | Status: DC

## 2021-03-10 NOTE — Patient Instructions (Addendum)
You have mid lumbar area sebaceous cyst that appears infected. Rx doxycycline antibiotic. If area worsens let us know.  Mild scalp tenderness. Possible folliculitis of scalp. On inspection no obvious abnormality.  For temporal pain bilateral got sed rate. Normal neurologic exam.  For fatigue get cbc, cmp, tsh, b12 and b1.  For perimenopause/ no menses or one year get fsh.  Follow up in 10 days or as needed.  Will recheck area on back. Want area to flatten out and return to normal color. If not then need to refer to specialist.

## 2021-03-10 NOTE — Progress Notes (Signed)
Subjective:    Patient ID: Claire Hill, female    DOB: [birth year], 50 y.o.   MRN: [id number]  HPI  Pt in with lump on her back. Pt states small at first and now getting larger. Pt has applied some alcohol to area. This area is present for 2 weeks.   Pt states color change in swollen area for about 2 weeks.  No fever, no chills or sweats.  Pt has also states yesterday her scalp was little tender to palpation and when she was brushing her hair.  Today mild tenderness/ha temporal areas.  lmp- one year ago. Perimenopausal pattern very rare menses.    Review of Systems  Constitutional: Positive for fatigue. Negative for chills and fever.  Respiratory: Negative for cough, chest tightness, shortness of breath and wheezing.   Cardiovascular: Negative for chest pain and palpitations.  Gastrointestinal: Negative for anal bleeding and blood in stool.  Musculoskeletal: Negative for back pain and myalgias.  Neurological: Negative for dizziness and headaches.       Temporal  Hematological: Negative for adenopathy. Does not bruise/bleed easily.  Psychiatric/Behavioral: Negative for behavioral problems and decreased concentration.    Past Medical History:  Diagnosis Date  . Allergy   . Anemia   . Asthma    pt states no-no inhalers  . COPD (chronic obstructive pulmonary disease) ([place])   . Diabetes mellitus   . Diabetes mellitus without complication ([place])   . Glaucoma   . Herpes    Right eye  . Herpes simplex of eye   . Hyperlipidemia   . OCD (obsessive compulsive disorder)      Social History   Socioeconomic History  . Marital status: Married    Spouse name: Jan  . Number of children: 0  . Years of education: Not on file  . Highest education level: Associate degree: academic program  Occupational History  . Occupation: factory Surveyor, quantity  Tobacco Use  . Smoking status: Never Smoker  . Smokeless tobacco: Never Used  Vaping Use  . Vaping Use: Never used   Substance and Sexual Activity  . Alcohol use: No  . Drug use: No  . Sexual activity: Not Currently    Partners: Male  Other Topics Concern  . Not on file  Social History Narrative   ** Merged History Encounter **    Exercise-no      Patient is right-handed. She lives with her husband in a one level home, 4 steps to enter. She drinks coffee occasionally. She exercises 4 days a week.   Social Determinants of Health   Financial Resource Strain: Not on file  Food Insecurity: Not on file  Transportation Needs: Not on file  Physical Activity: Not on file  Stress: Not on file  Social Connections: Not on file  Intimate Partner Violence: Not on file    Past Surgical History:  Procedure Laterality Date  . MASS EXCISION  06/22/2012   Procedure: EXCISION MASS;  Surgeon: Harl Bowie, MD;  Location: [facility];  Service: General;  Laterality: Right;  Excision chronic Right axillary cyst  . NO PAST SURGERIES      Family History  Problem Relation Age of Onset  . Diabetes Father   . Hypertension Mother   . Diabetes Paternal Grandmother   . Cancer Other        LUNG, BRAIN AND STOMACH   . Cancer Cousin        ovarian    No Known Allergies  Current Outpatient Medications on File Prior to Visit  Medication Sig Dispense Refill  . aspirin (ASPIRIN EC) 81 MG EC tablet Take 1 tablet (81 mg total) by mouth daily. Swallow whole. 30 tablet 12  . azelastine (ASTELIN) 0.1 % nasal spray Take 1-2 sprays twice a day as needed for runny nose. 30 mL 5  . blood glucose meter kit and supplies KIT Use a directed twice a day.  E11.8.  Use Relion Meter 1 each 0  . blood glucose meter kit and supplies KIT Dispense based on patient and insurance preference. Use up to four times daily as directed. (FOR ICD-9 250.00, 250.01). 1 each 0  . blood glucose meter kit and supplies KIT Dispense based on patient and insurance preference. Use up to four times daily as directed. (FOR ICD-9 250.00,  250.01). 1 each 0  . brimonidine (ALPHAGAN) 0.2 % ophthalmic solution   98  . brimonidine-timolol (COMBIGAN) 0.2-0.5 % ophthalmic solution Place 1 drop into the right eye every 12 (twelve) hours. Reported on 03/16/2016    . Crisaborole (EUCRISA) 2 % OINT Apply twice a day on the hands 60 g 5  . cyclobenzaprine (FLEXERIL) 10 MG tablet TAKE ONE TABLET BY MOUTH EVERY NIGHT AT BEDTIME 30 tablet 0  . ezetimibe (ZETIA) 10 MG tablet TAKE ONE TABLET BY MOUTH DAILY 90 tablet 1  . famotidine (PEPCID) 20 MG tablet TAKE ONE TABLET BY MOUTH TWICE A DAY 60 tablet 0  . fluticasone (FLONASE) 50 MCG/ACT nasal spray Take 1-2 sprays daily for nasal congestion. 16 g 5  . glimepiride (AMARYL) 1 MG tablet Take 0.5 tablets (0.5 mg total) by mouth daily with breakfast. 45 tablet 3  . hydrocortisone 2.5 % cream Apply topically 2 (two) times daily. 30 g 0  . hypromellose (GENTEAL SEVERE) 0.3 % GEL ophthalmic ointment Place 1 application into both eyes.    . Lancets (ONETOUCH DELICA PLUS [id number]) MISC USE TO CHECK BLOOD SUGAR UP TO FOUR TIMES A DAY 100 each 0  . latanoprost (XALATAN) 0.005 % ophthalmic solution     . levocetirizine (XYZAL) 5 MG tablet Take 1 tablet (5 mg total) by mouth every evening. 90 tablet 2  . metFORMIN (GLUCOPHAGE-XR) 500 MG 24 hr tablet Take 2 tablets (1,000 mg total) by mouth daily. 180 tablet 0  . montelukast (SINGULAIR) 10 MG tablet Take 1 tablet (10 mg total) by mouth at bedtime. 90 tablet 2  . Multiple Vitamins-Minerals (ICAPS) TABS Take by mouth.    . NONFORMULARY OR COMPOUNDED ITEM Alcohol pads  #1 box  Use as directed 1 each 5  . nortriptyline (PAMELOR) 10 MG capsule TAKE ONE CAPSULE BY MOUTH EVERY NIGHT AT BEDTIME 30 capsule 1  . olopatadine (PATANOL) 0.1 % ophthalmic solution Place 1 drop into both eyes 2 (two) times daily as needed for allergies. 5 mL 5  . omeprazole (PRILOSEC) 40 MG capsule TAKE ONE CAPSULE BY MOUTH DAILY 30 capsule 0  . ONETOUCH ULTRA test strip USE TO CHECK BLOOD  GLUCOSE UP TO 4 TIMES A DAY AS DIRECTED 100 strip 3  . rosuvastatin (CRESTOR) 40 MG tablet Take 1 tablet (40 mg total) by mouth daily. 30 tablet 0  . Semaglutide (RYBELSUS) 7 MG TABS Take 7 mg by mouth daily. 90 tablet 3  . valACYclovir (VALTREX) 1000 MG tablet Take 1,000 mg by mouth daily.     No current facility-administered medications on file prior to visit.    BP 101/76   Pulse 95  Resp 17   Ht _0  (1.727 m)   Wt 155 lb 3.2 oz (70.4 kg)   LMP  (LMP Unknown) Comment: patient could not remember last period 05/15/17  SpO2 98%   BMI 23.60 kg/m       Objective:   Physical Exam  General Mental Status- Alert. General Appearance- Not in acute distress.   Skin Upper  Mid lumbar area L1 area 2 cm approximate wide reddish/dark hyperpigmented area with central region appearance of small dimple whitish dc. Appearance of infected sebacious cyst.  Neck Carotid Arteries- Normal color. Moisture- Normal Moisture. No carotid bruits. No JVD.  Chest and Lung Exam Auscultation: Breath Sounds:-Normal.  Cardiovascular Auscultation:Rythm- Regular. Murmurs & Other Heart Sounds:Auscultation of the heart reveals- No Murmurs.  Abdomen Inspection:-Inspeection Normal. Palpation/Percussion:Note:No mass. Palpation and Percussion of the abdomen reveal- Non Tender, Non Distended + BS, no rebound or guarding.   Neurologic Cranial Nerve exam:- CN III-XII intact(No nystagmus), symmetric smile. Strength:- 5/5 equal and symmetric strength both upper and lower extremities.      Assessment & Plan:  You have mid lumbar area sebaceous cyst that appears infected. Rx doxycycline antibiotic. If area worsens let us know.  Mild scalp tenderness. Possible folliculitis of scalp. On inspection no obvious abnormality.  For temporal pain bilateral got sed rate. Normal neurologic exam.  For fatigue get cbc, cmp, tsh, b12 and b1.  For perimenopause/ no menses or one year get fsh.  Follow up in 10 days  or as needed.  Will recheck area on back. Want area to flatten out and return to normal color. If not then need to refer to specialist.  Mackie Pai, PA-C

## 2021-03-11 LAB — CBC WITH DIFFERENTIAL/PLATELET
Basophils Absolute: 0 10*3/uL (ref 0.0–0.1)
Basophils Relative: 0.5 % (ref 0.0–3.0)
Eosinophils Absolute: 0.1 10*3/uL (ref 0.0–0.7)
Eosinophils Relative: 2.7 % (ref 0.0–5.0)
HCT: 37.7 % (ref 36.0–46.0)
Hemoglobin: 12.6 g/dL (ref 12.0–15.0)
Lymphocytes Relative: 52.2 % — ABNORMAL HIGH (ref 12.0–46.0)
Lymphs Abs: 2.1 10*3/uL (ref 0.7–4.0)
MCHC: 33.3 g/dL (ref 30.0–36.0)
MCV: 90 fl (ref 78.0–100.0)
Monocytes Absolute: 0.2 10*3/uL (ref 0.1–1.0)
Monocytes Relative: 4.8 % (ref 3.0–12.0)
Neutro Abs: 1.6 10*3/uL (ref 1.4–7.7)
Neutrophils Relative %: 39.8 % — ABNORMAL LOW (ref 43.0–77.0)
Platelets: 220 10*3/uL (ref 150.0–400.0)
RBC: 4.19 Mil/uL (ref 3.87–5.11)
RDW: 12.7 % (ref 11.5–15.5)
WBC: 4.1 10*3/uL (ref 4.0–10.5)

## 2021-03-11 LAB — COMPREHENSIVE METABOLIC PANEL
ALT: 23 U/L (ref 0–35)
AST: 21 U/L (ref 0–37)
Albumin: 4.3 g/dL (ref 3.5–5.2)
Alkaline Phosphatase: 100 U/L (ref 39–117)
BUN: 18 mg/dL (ref 6–23)
CO2: 30 mEq/L (ref 19–32)
Calcium: 9.7 mg/dL (ref 8.4–10.5)
Chloride: 105 mEq/L (ref 96–112)
Creatinine, Ser: 0.84 mg/dL (ref 0.40–1.20)
GFR: 81.37 mL/min (ref >60.00)
Glucose, Bld: 151 mg/dL — ABNORMAL HIGH (ref 70–99)
Potassium: 4.2 mEq/L (ref 3.5–5.1)
Sodium: 141 mEq/L (ref 135–145)
Total Bilirubin: 0.4 mg/dL (ref 0.2–1.2)
Total Protein: 6.6 g/dL (ref 6.0–8.3)

## 2021-03-11 LAB — FOLLICLE STIMULATING HORMONE: FSH: 42.3 m[IU]/mL

## 2021-03-11 LAB — SEDIMENTATION RATE: Sed Rate: 12 mm/hr (ref 0–20)

## 2021-03-11 LAB — HEMOGLOBIN A1C: Hgb A1c MFr Bld: 6.9 % — ABNORMAL HIGH (ref 4.6–6.5)

## 2021-03-11 LAB — VITAMIN B12: Vitamin B-12: 297 pg/mL (ref 211–911)

## 2021-03-11 LAB — TSH: TSH: 0.89 u[IU]/mL (ref 0.35–4.50)

## 2021-03-16 LAB — VITAMIN B1: Vitamin B1 (Thiamine): 18 nmol/L (ref 8–30)

## 2021-03-17 ENCOUNTER — Ambulatory Visit: Payer: Self-pay | Admitting: Endocrinology

## 2021-03-23 ENCOUNTER — Telehealth: Payer: Self-pay | Admitting: Medical

## 2021-03-23 ENCOUNTER — Other Ambulatory Visit: Payer: Self-pay

## 2021-03-23 ENCOUNTER — Ambulatory Visit (HOSPITAL_BASED_OUTPATIENT_CLINIC_OR_DEPARTMENT_OTHER)
Admission: RE | Admit: 2021-03-23 | Discharge: 2021-03-23 | Disposition: A | Discharge status: Home / Self Care | Payer: 59 | Source: Ambulatory Visit | Attending: Medical | Admitting: Medical

## 2021-03-23 ENCOUNTER — Ambulatory Visit: Payer: 59 | Admitting: Medical

## 2021-03-23 VITALS — BP 118/54 | HR 92 | Resp 18 | Ht 68.0 in | Wt 158.0 lb

## 2021-03-23 DIAGNOSIS — M79672 Pain in left foot: Secondary | ICD-10-CM

## 2021-03-23 DIAGNOSIS — D229 Melanocytic nevi, unspecified: Secondary | ICD-10-CM

## 2021-03-23 MED ORDER — DICLOFENAC SODIUM 75 MG PO TBEC: 75.0000 mg | DELAYED_RELEASE_TABLET | Freq: Two times a day (BID) | ORAL | 0 refills | Status: DC

## 2021-03-23 NOTE — Patient Instructions (Addendum)
Your area on back looked like was infected sebaceous cyst but today the area is flat and does not look infected. But the baseline discolored skin still present. Want to get dermatologist opinion. I placed referral. If no one calls you by 2 weeks let me know.  For left foot pain will get uric acid level, sed rate and foot xray. Rx diclofenac for pain.  Follow up 14 days or a needed

## 2021-03-23 NOTE — Progress Notes (Signed)
Subjective:    Patient ID: Claire Hill, female    DOB: 28-Aug-1971, 50 y.o.   MRN: [id number]  HPI  Pt in for follow up.    Pt states the area in her mid back did improve. Improved in that it is not swollen or tender. But still has dark hyperpigmented area.   On review pt was referred to dermatologist in 2019 . Not sure what happened but pt states never went to a dermatologist office.   Pt has some pain left foot pain for one day. Pain on top of foot and arch. No trauma or fall. Pt pt mentioned that 2 years specialist that injected her lateral ankle area. No med used presently for pain.      Review of Systems  Constitutional: Negative for chills, fatigue and fever.  HENT: Negative for congestion and drooling.   Respiratory: Negative for cough, chest tightness, shortness of breath and wheezing.   Cardiovascular: Negative for chest pain and palpitations.  Gastrointestinal: Negative for abdominal pain.  Genitourinary: Negative for dysuria.  Musculoskeletal:       Left foot pain  Skin: Negative for rash.  Hematological: Negative for adenopathy. Does not bruise/bleed easily.  Psychiatric/Behavioral: Negative for behavioral problems and confusion.    Past Medical History:  Diagnosis Date  . Allergy   . Anemia   . Asthma    pt states no-no inhalers  . COPD (chronic obstructive pulmonary disease) ([place])   . Diabetes mellitus   . Diabetes mellitus without complication ([place])   . Glaucoma   . Herpes    Right eye  . Herpes simplex of eye   . Hyperlipidemia   . OCD (obsessive compulsive disorder)      Social History   Socioeconomic History  . Marital status: Married    Spouse name: Jan  . Number of children: 0  . Years of education: Not on file  . Highest education level: Associate degree: academic program  Occupational History  . Occupation: factory Surveyor, quantity  Tobacco Use  . Smoking status: Never Smoker  . Smokeless tobacco: Never Used  Vaping Use  .  Vaping Use: Never used  Substance and Sexual Activity  . Alcohol use: No  . Drug use: No  . Sexual activity: Not Currently    Partners: Male  Other Topics Concern  . Not on file  Social History Narrative   ** Merged History Encounter **    Exercise-no      Patient is right-handed. She lives with her husband in a one level home, 4 steps to enter. She drinks coffee occasionally. She exercises 4 days a week.   Social Determinants of Health   Financial Resource Strain: Not on file  Food Insecurity: Not on file  Transportation Needs: Not on file  Physical Activity: Not on file  Stress: Not on file  Social Connections: Not on file  Intimate Partner Violence: Not on file    Past Surgical History:  Procedure Laterality Date  . MASS EXCISION  06/22/2012   Procedure: EXCISION MASS;  Surgeon: Harl Bowie, MD;  Location: [facility];  Service: General;  Laterality: Right;  Excision chronic Right axillary cyst  . NO PAST SURGERIES      Family History  Problem Relation Age of Onset  . Diabetes Father   . Hypertension Mother   . Diabetes Paternal Grandmother   . Cancer Other        LUNG, BRAIN AND STOMACH   . Cancer  Cousin        ovarian    No Known Allergies  Current Outpatient Medications on File Prior to Visit  Medication Sig Dispense Refill  . aspirin (ASPIRIN EC) 81 MG EC tablet Take 1 tablet (81 mg total) by mouth daily. Swallow whole. 30 tablet 12  . azelastine (ASTELIN) 0.1 % nasal spray Take 1-2 sprays twice a day as needed for runny nose. 30 mL 5  . blood glucose meter kit and supplies KIT Use a directed twice a day.  E11.8.  Use Relion Meter 1 each 0  . blood glucose meter kit and supplies KIT Dispense based on patient and insurance preference. Use up to four times daily as directed. (FOR ICD-9 250.00, 250.01). 1 each 0  . blood glucose meter kit and supplies KIT Dispense based on patient and insurance preference. Use up to four times daily as  directed. (FOR ICD-9 250.00, 250.01). 1 each 0  . brimonidine (ALPHAGAN) 0.2 % ophthalmic solution   98  . brimonidine-timolol (COMBIGAN) 0.2-0.5 % ophthalmic solution Place 1 drop into the right eye every 12 (twelve) hours. Reported on 03/16/2016    . Crisaborole (EUCRISA) 2 % OINT Apply twice a day on the hands 60 g 5  . cyclobenzaprine (FLEXERIL) 10 MG tablet TAKE ONE TABLET BY MOUTH EVERY NIGHT AT BEDTIME 30 tablet 0  . ezetimibe (ZETIA) 10 MG tablet TAKE ONE TABLET BY MOUTH DAILY 90 tablet 1  . famotidine (PEPCID) 20 MG tablet TAKE ONE TABLET BY MOUTH TWICE A DAY 60 tablet 0  . fluticasone (FLONASE) 50 MCG/ACT nasal spray Take 1-2 sprays daily for nasal congestion. 16 g 5  . glimepiride (AMARYL) 1 MG tablet Take 0.5 tablets (0.5 mg total) by mouth daily with breakfast. 45 tablet 3  . hydrocortisone 2.5 % cream Apply topically 2 (two) times daily. 30 g 0  . hypromellose (GENTEAL SEVERE) 0.3 % GEL ophthalmic ointment Place 1 application into both eyes.    . Lancets (ONETOUCH DELICA PLUS [id number]) MISC USE TO CHECK BLOOD SUGAR UP TO FOUR TIMES A DAY 100 each 0  . latanoprost (XALATAN) 0.005 % ophthalmic solution     . levocetirizine (XYZAL) 5 MG tablet Take 1 tablet (5 mg total) by mouth every evening. 90 tablet 2  . metFORMIN (GLUCOPHAGE-XR) 500 MG 24 hr tablet Take 2 tablets (1,000 mg total) by mouth daily. 180 tablet 0  . montelukast (SINGULAIR) 10 MG tablet Take 1 tablet (10 mg total) by mouth at bedtime. 90 tablet 2  . Multiple Vitamins-Minerals (ICAPS) TABS Take by mouth.    . NONFORMULARY OR COMPOUNDED ITEM Alcohol pads  #1 box  Use as directed 1 each 5  . nortriptyline (PAMELOR) 10 MG capsule TAKE ONE CAPSULE BY MOUTH EVERY NIGHT AT BEDTIME 30 capsule 1  . olopatadine (PATANOL) 0.1 % ophthalmic solution Place 1 drop into both eyes 2 (two) times daily as needed for allergies. 5 mL 5  . omeprazole (PRILOSEC) 40 MG capsule TAKE ONE CAPSULE BY MOUTH DAILY 30 capsule 0  . ONETOUCH ULTRA  test strip USE TO CHECK BLOOD GLUCOSE UP TO 4 TIMES A DAY AS DIRECTED 100 strip 3  . rosuvastatin (CRESTOR) 40 MG tablet Take 1 tablet (40 mg total) by mouth daily. 30 tablet 0  . Semaglutide (RYBELSUS) 7 MG TABS Take 7 mg by mouth daily. 90 tablet 3  . valACYclovir (VALTREX) 1000 MG tablet Take 1,000 mg by mouth daily.     No current facility-administered medications  on file prior to visit.    BP (!) 118/54   Pulse 92   Resp 18   Ht $R'5\' 8"'om$  (1.727 m)   Wt 158 lb (71.7 kg)   LMP  (LMP Unknown) Comment: patient could not remember last period 05/15/17  SpO2 93%   BMI 24.02 kg/m       Objective:   Physical Exam  General- No acute distress. Pleasant patient. Neck- Full range of motion, no jvd Lungs- Clear, even and unlabored. Heart- regular rate and rhythm. Neurologic- CNII- XII grossly intact.  Left foot- faint tender top of foot and on palpation arch. Does have high arch appearance. No warmth or swelling.(applied ace wrap and complained of distal metatarsal area pain so removed)   Skin Upper  Mid lumbar area L1 area 2 cm approximate wide dark hyperpigmented area with central region appearance of small dimple. No dc and area is completely flat today. None tender.         Assessment & Plan:  Your area on back looked like was infected sebaceous cyst but today the area is flat and does not look infected. But the baseline discolored skin still present. Want to get dermatologist opinion. I placed referral. If no one calls you by 2 weeks let me know.  For left foot pain will get uric acid level, sed rate and foot xray. Rx diclofenac for pain.  Follow up 14 days or a needed  [employer], PA-C   Time spent with patient today was  32 minutes which consisted of chart review, discussing diagnosis, work up, treatment and documentation.

## 2021-03-23 NOTE — Telephone Encounter (Signed)
Will you refer this pt to [facility] location. See there referral.

## 2021-03-24 LAB — SEDIMENTATION RATE: Sed Rate: 8 mm/hr (ref 0–20)

## 2021-03-24 LAB — URIC ACID: Uric Acid, Serum: 3.7 mg/dL (ref 2.4–7.0)

## 2021-03-24 NOTE — Addendum Note (Signed)
Addended by: Anabel Halon on: 03/24/2021 11:42 AM   Modules accepted: Orders

## 2021-03-26 ENCOUNTER — Encounter: Payer: Self-pay | Admitting: *Deleted

## 2021-05-03 ENCOUNTER — Ambulatory Visit (INDEPENDENT_AMBULATORY_CARE_PROVIDER_SITE_OTHER): Payer: 59 | Admitting: Allergy

## 2021-05-03 ENCOUNTER — Encounter: Payer: Self-pay | Admitting: Allergy

## 2021-05-03 ENCOUNTER — Other Ambulatory Visit: Payer: Self-pay

## 2021-05-03 VITALS — BP 130/74 | HR 74 | Temp 95.9°F | Resp 18

## 2021-05-03 DIAGNOSIS — R12 Heartburn: Secondary | ICD-10-CM

## 2021-05-03 DIAGNOSIS — J302 Other seasonal allergic rhinitis: Secondary | ICD-10-CM | POA: Diagnosis not present

## 2021-05-03 DIAGNOSIS — R519 Headache, unspecified: Secondary | ICD-10-CM | POA: Diagnosis not present

## 2021-05-03 DIAGNOSIS — H1013 Acute atopic conjunctivitis, bilateral: Secondary | ICD-10-CM

## 2021-05-03 DIAGNOSIS — L2089 Other atopic dermatitis: Secondary | ICD-10-CM | POA: Diagnosis not present

## 2021-05-03 DIAGNOSIS — H1010 Acute atopic conjunctivitis, unspecified eye: Secondary | ICD-10-CM

## 2021-05-03 DIAGNOSIS — J3089 Other allergic rhinitis: Secondary | ICD-10-CM

## 2021-05-03 MED ORDER — FLUTICASONE PROPIONATE 50 MCG/ACT NA SUSP: NASAL | 5 refills | Status: DC

## 2021-05-03 MED ORDER — MONTELUKAST SODIUM 10 MG PO TABS: 10.0000 mg | ORAL_TABLET | Freq: Every day | ORAL | 1 refills | Status: DC

## 2021-05-03 MED ORDER — CROMOLYN SODIUM 4 % OP SOLN: 1.0000 [drp] | Freq: Every day | OPHTHALMIC | 3 refills | Status: DC | PRN

## 2021-05-03 MED ORDER — EUCRISA 2 % EX OINT: TOPICAL_OINTMENT | CUTANEOUS | 5 refills | Status: DC

## 2021-05-03 MED ORDER — LEVOCETIRIZINE DIHYDROCHLORIDE 5 MG PO TABS: 5.0000 mg | ORAL_TABLET | Freq: Every evening | ORAL | 1 refills | Status: DC

## 2021-05-03 MED ORDER — AZELASTINE HCL 0.1 % NA SOLN: NASAL | 5 refills | Status: DC

## 2021-05-03 NOTE — Assessment & Plan Note (Signed)
Past history - perennial rhinoconjunctivitis symptoms for the past 2 years. Does not recall previous ENT work-up. 2020 skin testing showed: Positive to grass pollen, weed, ragweed, dust mites and cat.   Interim history - ran out of nasal sprays with no flares in symptoms. Some itchy eyes.  Continue environmental control measures.  May take levoceterizine 5mg  daily for allergies if needed.   Continue Singulair 10mg  daily at night.   Use Astelin 1-2 sprays twice a day as needed for drainage.  May use Flonase 2 sprays daily for nasal congestion.  Use cromolyn 4% 1 drop in each eye up once a day as needed for itchy/watery eyes.    Consider allergy injections for long term control if above medications do not help the symptoms - handout given.

## 2021-05-03 NOTE — Assessment & Plan Note (Signed)
Past history - Patient concerned about food allergies as she noticed clearing of her throat after eating [place] food such as tacos, [place], pork, or salad.  Sometimes she also has emesis episodes.  However she can eat these foods at times without any symptoms.  She does have heartburn and is on omeprazole currently. 2020 skin testing was negative to foods. No previous EGD or GI evaluation.  Interim history - it is unclear as to whether she is taking Pepcid or not.   Continue reflux lifestyle modification diet as below.  Advised patient to bring all her medications in for her next visit.

## 2021-05-03 NOTE — Assessment & Plan Note (Signed)
   Follow up with PCP regarding this if not improving.

## 2021-05-03 NOTE — Progress Notes (Signed)
Follow Up Note  RE: Claire Hill MRN: [id number] DOB: 1970-11-04 Date of Office Visit: 05/03/2021  Referring provider: Ann Hill, * Primary care provider: Carollee Hill, Claire Apa, Claire Hill  Chief Complaint: Follow-up, Nasal Congestion, and Pruritus  History of Present Illness: I had the pleasure of seeing Claire Hill for a follow up visit at the [facility] on 05/03/2021. She is a 50 y.o. female, who is being followed for allergic rhinoconjunctivitis, atopic dermatitis and heartburn. Her previous allergy office visit was on 01/01/2021 with Dr. Maudie Hill. Today is a regular follow up visit.  Seasonal and perennial allergic rhinoconjunctivitis Using Xyzal and Singulair daily. Ran out of nasal sprays for a few weeks with no flare in symptoms.  Using systane ultra eye drops for itchy eyes.   Patient is not able to afford injections at this time.    Other atopic dermatitis Claire Hill is helping.  Broke out in rash on abdominal area for 2 days. Denies any changes in personal care products. No bug bites. She also had a pulsating headache.    Heartburn Stable and patient is not sure if she's taking any medications for this.   Patient did not bring her medications as requested at the last OV.  Assessment and Plan: Claire Hill is a 50 y.o. female with: Seasonal and perennial allergic rhinoconjunctivitis Past history - perennial rhinoconjunctivitis symptoms for the past 2 years. Does not recall previous ENT work-up. 2020 skin testing showed: Positive to grass pollen, weed, ragweed, dust mites and cat.   Interim history - ran out of nasal sprays with no flares in symptoms. Some itchy eyes. Continue environmental control measures. May take levoceterizine 5mg  daily for allergies if needed.  Continue Singulair 10mg  daily at night.  Use Astelin 1-2 sprays twice a day as needed for drainage. May use Flonase 2 sprays daily for nasal congestion. Use cromolyn 4% 1  drop in each eye up once a day as needed for itchy/watery eyes.   Consider allergy injections for long term control if above medications Claire Hill not help the symptoms - handout given.   Other atopic dermatitis Stable. Rash on abdominal area.  Continue proper skin care measures.  May use Eucrisa (crisaborole) 2% ointment twice a day on the hands.  Rash on abdominal area looks like an insect/bug bite.  Heartburn Past history - Patient concerned about food allergies as she noticed clearing of her throat after eating [place] food such as tacos, [place], pork, or salad.  Sometimes she also has emesis episodes.  However she can eat these foods at times without any symptoms.  She does have heartburn and is on omeprazole currently. 2020 skin testing was negative to foods. No previous EGD or GI evaluation.  Interim history - it is unclear as to whether she is taking Pepcid or not.  Continue reflux lifestyle modification diet as below. Advised patient to bring all her medications in for her next visit.  Generalized headache Follow up with PCP regarding this if not improving.  Return in about 4 months (around 09/03/2021).  Meds ordered this encounter  Medications   azelastine (ASTELIN) 0.1 % nasal spray    Sig: Take 1-2 sprays twice a day as needed for runny nose.    Dispense:  30 mL    Refill:  5   EUCRISA 2 % OINT    Sig: Apply twice a day on the hands    Dispense:  60 g    Refill:  5   fluticasone (FLONASE) 50 MCG/ACT nasal spray    Sig: Take 1-2 sprays daily for nasal congestion.    Dispense:  16 g    Refill:  5   levocetirizine (XYZAL) 5 MG tablet    Sig: Take 1 tablet (5 mg total) by mouth every evening.    Dispense:  90 tablet    Refill:  1   montelukast (SINGULAIR) 10 MG tablet    Sig: Take 1 tablet (10 mg total) by mouth at bedtime.    Dispense:  90 tablet    Refill:  1   cromolyn (OPTICROM) 4 % ophthalmic solution    Sig: Place 1 drop into both eyes daily as needed (itchy/watery  eyes).    Dispense:  10 mL    Refill:  3   Lab Orders  No laboratory test(s) ordered today    Diagnostics: None.  Medication List:  Current Outpatient Medications  Medication Sig Dispense Refill   aspirin (ASPIRIN EC) 81 MG EC tablet Take 1 tablet (81 mg total) by mouth daily. Swallow whole. 30 tablet 12   blood glucose meter kit and supplies KIT Use a directed twice a day.  E11.8.  Use Relion Meter 1 each 0   blood glucose meter kit and supplies KIT Dispense based on patient and insurance preference. Use up to four times daily as directed. (FOR ICD-9 250.00, 250.01). 1 each 0   blood glucose meter kit and supplies KIT Dispense based on patient and insurance preference. Use up to four times daily as directed. (FOR ICD-9 250.00, 250.01). 1 each 0   brimonidine (ALPHAGAN) 0.2 % ophthalmic solution   98   brimonidine-timolol (COMBIGAN) 0.2-0.5 % ophthalmic solution Place 1 drop into the right eye every 12 (twelve) hours. Reported on 03/16/2016     cromolyn (OPTICROM) 4 % ophthalmic solution Place 1 drop into both eyes daily as needed (itchy/watery eyes). 10 mL 3   cyclobenzaprine (FLEXERIL) 10 MG tablet TAKE ONE TABLET BY MOUTH EVERY NIGHT AT BEDTIME 30 tablet 0   diclofenac (VOLTAREN) 75 MG EC tablet Take 1 tablet (75 mg total) by mouth 2 (two) times daily. 20 tablet 0   ezetimibe (ZETIA) 10 MG tablet TAKE ONE TABLET BY MOUTH DAILY 90 tablet 1   famotidine (PEPCID) 20 MG tablet TAKE ONE TABLET BY MOUTH TWICE A DAY 60 tablet 0   glimepiride (AMARYL) 1 MG tablet Take 0.5 tablets (0.5 mg total) by mouth daily with breakfast. 45 tablet 3   hydrocortisone 2.5 % cream Apply topically 2 (two) times daily. 30 g 0   hypromellose (GENTEAL SEVERE) 0.3 % GEL ophthalmic ointment Place 1 application into both eyes.     Lancets (ONETOUCH DELICA PLUS [id number]) MISC USE TO CHECK BLOOD SUGAR UP TO FOUR TIMES A DAY 100 each 0   latanoprost (XALATAN) 0.005 % ophthalmic solution      metFORMIN (GLUCOPHAGE-XR)  500 MG 24 hr tablet Take 2 tablets (1,000 mg total) by mouth daily. 180 tablet 0   Multiple Vitamins-Minerals (ICAPS) TABS Take by mouth.     NONFORMULARY OR COMPOUNDED ITEM Alcohol pads  #1 box  Use as directed 1 each 5   nortriptyline (PAMELOR) 10 MG capsule TAKE ONE CAPSULE BY MOUTH EVERY NIGHT AT BEDTIME 30 capsule 1   ONETOUCH ULTRA test strip USE TO CHECK BLOOD GLUCOSE UP TO 4 TIMES A DAY AS DIRECTED 100 strip 3   rosuvastatin (CRESTOR) 40 MG tablet Take 1 tablet (40 mg total) by  mouth daily. 30 tablet 0   Semaglutide (RYBELSUS) 7 MG TABS Take 7 mg by mouth daily. 90 tablet 3   valACYclovir (VALTREX) 1000 MG tablet Take 1,000 mg by mouth daily.     azelastine (ASTELIN) 0.1 % nasal spray Take 1-2 sprays twice a day as needed for runny nose. 30 mL 5   EUCRISA 2 % OINT Apply twice a day on the hands 60 g 5   fluticasone (FLONASE) 50 MCG/ACT nasal spray Take 1-2 sprays daily for nasal congestion. 16 g 5   levocetirizine (XYZAL) 5 MG tablet Take 1 tablet (5 mg total) by mouth every evening. 90 tablet 1   montelukast (SINGULAIR) 10 MG tablet Take 1 tablet (10 mg total) by mouth at bedtime. 90 tablet 1   No current facility-administered medications for this visit.   Allergies: No Known Allergies I reviewed her past medical history, social history, family history, and environmental history and no significant changes have been reported from her previous visit.  Review of Systems  Constitutional:  Negative for appetite change, chills, fever and unexpected weight change.  HENT:  Negative for congestion, rhinorrhea and sneezing.   Eyes:  Positive for redness and itching.  Respiratory:  Negative for cough, chest tightness, shortness of breath and wheezing.   Cardiovascular:  Negative for chest pain.  Gastrointestinal:  Negative for abdominal pain.  Genitourinary:  Negative for difficulty urinating.  Skin:  Positive for rash.  Allergic/Immunologic: Positive for environmental allergies. Negative  for food allergies.  Neurological:  Positive for headaches.   Objective: BP 130/74 (BP Location: Left Arm, Patient Position: Sitting, Cuff Size: Normal)   Pulse 74   Temp (!) 95.9 F (35.5 C) (Temporal)   Resp 18   LMP  (LMP Unknown) Comment: patient could not remember last period 05/15/17  SpO2 99%  There is no height or weight on file to calculate BMI. Physical Exam Vitals and nursing note reviewed.  Constitutional:      Appearance: Normal appearance. She is well-developed.  HENT:     Head: Normocephalic and atraumatic.     Right Ear: Tympanic membrane and external ear normal.     Left Ear: Tympanic membrane and external ear normal.     Nose: Nose normal.     Mouth/Throat:     Mouth: Mucous membranes are moist.     Pharynx: Oropharynx is clear.  Eyes:     Conjunctiva/sclera: Conjunctivae normal.  Cardiovascular:     Rate and Rhythm: Normal rate and regular rhythm.     Heart sounds: Normal heart sounds. No murmur heard.   No friction rub. No gallop.  Pulmonary:     Effort: Pulmonary effort is normal.     Breath sounds: Normal breath sounds. No wheezing, rhonchi or rales.  Musculoskeletal:     Cervical back: Neck supple.  Skin:    General: Skin is warm.     Findings: Rash present.     Comments: Erythematous patch on right lower abdominal area - with central punctate mark. No rash on scalp.  Neurological:     Mental Status: She is alert and oriented to person, place, and time.  Psychiatric:        Behavior: Behavior normal.   Previous notes and tests were reviewed. The plan was reviewed with the patient/family, and all questions/concerned were addressed.  It was my pleasure to see Claire Hill today and participate in her care. Please feel free to contact me with any questions or concerns.  Sincerely,  Rexene Alberts, [facility]  [facility] office: [place] office: [phone]

## 2021-05-03 NOTE — Assessment & Plan Note (Signed)
Stable. Rash on abdominal area.   Continue proper skin care measures.   May use Eucrisa (crisaborole) 2% ointment twice a day on the hands.   Rash on abdominal area looks like an insect/bug bite.

## 2021-05-03 NOTE — Patient Instructions (Addendum)
Bring all your medications to your next visit.  Seasonal and perennial allergic rhinoconjunctivitis 2020 skin testing showed: Positive to grass pollen, weed, ragweed, dust mites and cat.   Continue environmental control measures. May take levoceterizine 5mg  daily for allergies if needed.  Continue Singulair 10mg  daily at night.  Use Astelin 1-2 sprays twice a day as needed for drainage. May use Flonase 2 sprays daily for nasal congestion. Use cromolyn 4% 1 drop in each eye up once a day as needed for itchy/watery eyes.  Consider allergy injections for long term control if above medications do not help the symptoms - handout given.    Other atopic dermatitis Continue proper skin care measures.  May use Eucrisa (crisaborole) 2% ointment twice a day on the hands. Samples given.  If it burns, place the medication in the refrigerator.  Apply a thin layer of moisturizer and then apply the Eucrisa on top of it.   Heartburn Continue reflux lifestyle modification diet as below.  Follow up in 4 months or sooner if needed.  Follow up with PCP regarding headaches.   Skin care recommendations  Bath time: Always use lukewarm water. AVOID very hot or cold water. Keep bathing time to 5-10 minutes. Do NOT use bubble bath. Use a mild soap and use just enough to wash the dirty areas. Do NOT scrub skin vigorously.  After bathing, pat dry your skin with a towel. Do NOT rub or scrub the skin.  Moisturizers and prescriptions:  ALWAYS apply moisturizers immediately after bathing (within 3 minutes). This helps to lock-in moisture. Use the moisturizer several times a day over the whole body. Good summer moisturizers include: Aveeno, CeraVe, Cetaphil. Good winter moisturizers include: Aquaphor, Vaseline, Cerave, Cetaphil, Eucerin, Vanicream. When using moisturizers along with medications, the moisturizer should be applied about one hour after applying the medication to prevent diluting effect of the  medication or moisturize around where you applied the medications. When not using medications, the moisturizer can be continued twice daily as maintenance.  Laundry and clothing: Avoid laundry products with added color or perfumes. Use unscented hypo-allergenic laundry products such as Tide free, Cheer free & gentle, and All free and clear.  If the skin still seems dry or sensitive, you can try double-rinsing the clothes. Avoid tight or scratchy clothing such as wool. Do not use fabric softeners or dyer sheets.   Acidez estomacal Heartburn La acidez estomacal es un tipo de dolor o Tree surgeon que puede sentirse en la garganta o en el pecho. Con frecuencia se describe como un dolor urente (ardor). Tambin puede causar mal sabor en la boca que se siente cido. La acidez estomacal puede sentirse con mayor intensidad cuando usted se acuesta o se inclina, y a menudo [employer] noche. La acidez estomacal puede deberse al contenido del estmago que sube por el esfago (reflujo). Siga estas indicaciones en su casa: Comida y bebida  Evite ciertos alimentos y bebidas como se lo haya indicado el mdico. Estos pueden incluir: Caf y t (con o sin cafena). Bebidas que contengan alcohol. Bebidas energticas y deportivas. Bebidas gaseosas o refrescos. Chocolate y cacao. Menta y [place]. Ajo y cebolla. Rbano picante. Alimentos condimentados, picantes y cidos, por ejemplo, todos los tipos de pimientas, [place] en polvo, curry en polvo, vinagre, salsas picantes y [employer] Ctricos y sus jugos, por ejemplo, naranjas, limones y limas. Alimentos a base de tomate, como salsa de [place], [place], salsa picante y pizza con salsa de [place]. Alimentos fritos y  grasos, como donas, papas fritas y aderezos ricos en grasas. Carnes con alto contenido de grasa, como salchichas, y cortes de carnes rojas y blancas con mucha grasa, por ejemplo, chuletas o costillas, embutidos, jamn y tocino. Productos  lcteos ricos en grasas, como leche [place], [place] y [place] crema. Haga comidas pequeas y frecuentes Medical sales representative de comidas abundantes. Evite beber grandes cantidades de lquidos con las comidas. Evite comer 2 o 3 horas antes de acostarse. Evite recostarse inmediatamente despus de comer. No haga ejercicios enseguida despus de comer. Estilo de vida     Si tiene sobrepeso, baje Librarian, academic a un peso saludable para usted. Pdale consejos al mdico para bajar de peso de [place] segura. No consuma ningn producto que contenga nicotina o tabaco, como cigarrillos, cigarrillos electrnicos y tabaco de Higher education careers adviser. Estos pueden empeorar los sntomas. Si necesita ayuda para dejar de fumar, consulte al mdico. Use ropa suelta. No use nada apretado alrededor de la cintura que haga presin sobre el abdomen. [place] (eleve) la cabecera de la cama aproximadamente 6 pulgadas (15 cm) para dormir. Trate de reducir [employer] de estrs con actividades tales como el yoga o la meditacin. Si necesita ayuda para reducir [employer] de estrs, consulte al mdico. Indicaciones generales Est atento a cualquier cambio en los sntomas. [employer] medicamentos de venta libre y los recetados solamente como se lo haya indicado el mdico. No tome aspirina, ibuprofeno ni otros antiinflamatorios no esteroideos (AINE) a menos que el mdico se lo indique. Deje de tomar los medicamentos solamente como se lo haya indicado el mdico. Si deja de tomar algunos medicamentos demasiado rpido, los sntomas pueden empeorar. Concurra a todas las visitas de seguimiento como se lo haya indicado el mdico. Esto es importante. Comunquese con un mdico si: Aparecen nuevos sntomas. Baja de peso sin causa aparente. Tiene dificultad para tragar, o le duele cuando traga. Tiene tos persistente o sibilancias. Los sntomas no mejoran con Dispensing optician. Tiene acidez estomacal con frecuencia durante ms de 2 semanas. Solicite ayuda  inmediatamente si: [employer], el cuello, la [place], los dientes o la espalda. Se siente transpirado, mareado o tiene una sensacin de desvanecimiento. Siente falta de aire o Tourist information centre manager. Vomita y el vmito tiene un aspecto similar a la sangre o a los posos de caf. Las heces son sanguinolentas o negras. Estos sntomas pueden representar un problema grave que constituye Engineer, maintenance (IT). No espere a ver si los sntomas desaparecen. Solicite atencin mdica de inmediato. Comunquese con el servicio de emergencias de su localidad (911 en los [place]). No conduzca por sus propios medios [employer] hospital. Resumen La acidez estomacal es un tipo de dolor o Tree surgeon que puede sentirse en la garganta o en el pecho. Con frecuencia se describe como un dolor urente (ardor). Tambin puede causar mal sabor en la boca que se siente cido. Evite ciertos alimentos y bebidas como se lo haya indicado el mdico. Tome los medicamentos de venta libre y los recetados solamente como se lo haya indicado el mdico. No tome aspirina, ibuprofeno ni otros antiinflamatorios no esteroideos (AINE) a menos que el mdico se lo indique. Comunquese con un mdico si los sntomas no mejoran o si empeoran. Esta informacin no tiene Marine scientist el consejo del mdico. Asegrese de hacerle al mdico cualquier pregunta que tenga. Document Revised: 04/18/2018 Document Reviewed: 04/18/2018 Elsevier Patient Education  [place].

## 2021-05-05 ENCOUNTER — Telehealth: Payer: Self-pay

## 2021-05-05 NOTE — Telephone Encounter (Signed)
Pa Submitted thru cover my meds for eucrisa

## 2021-05-07 NOTE — Telephone Encounter (Signed)
Further information needed on pa so faxed over the form that was faxed to [place]

## 2021-05-10 MED ORDER — PIMECROLIMUS 1 % EX CREA: TOPICAL_CREAM | Freq: Two times a day (BID) | CUTANEOUS | 2 refills | Status: DC | PRN

## 2021-05-10 NOTE — Telephone Encounter (Signed)
Pa was denied pt has to try and fail tacrolimus 0.03% and 0.1% ointment, and pimecrolimus 1% cream

## 2021-05-10 NOTE — Addendum Note (Signed)
Addended by: Garnet Sierras on: 05/10/2021 04:40 PM   Modules accepted: Orders

## 2021-05-10 NOTE — Telephone Encounter (Signed)
Sent in new Rx - Elidel

## 2021-07-08 ENCOUNTER — Telehealth: Payer: Self-pay | Admitting: Endocrinology

## 2021-07-08 NOTE — Telephone Encounter (Signed)
Pt's husband calling in stating that her blood sugar meter is stating HI and nt giving a number as of today and has been having dizzy spells. Pt would like a call back.

## 2021-07-08 NOTE — Telephone Encounter (Signed)
LVM per interpreter for pt to cb to the office regarding High meter readings.

## 2021-07-23 ENCOUNTER — Ambulatory Visit: Payer: 59 | Admitting: Endocrinology

## 2021-07-29 ENCOUNTER — Other Ambulatory Visit: Payer: Self-pay

## 2021-07-29 ENCOUNTER — Ambulatory Visit (INDEPENDENT_AMBULATORY_CARE_PROVIDER_SITE_OTHER): Payer: 59 | Admitting: Endocrinology

## 2021-07-29 ENCOUNTER — Encounter: Payer: 59 | Attending: Endocrinology | Admitting: Dietician

## 2021-07-29 VITALS — BP 100/60 | HR 86 | Ht 68.0 in | Wt 154.8 lb

## 2021-07-29 DIAGNOSIS — E119 Type 2 diabetes mellitus without complications: Secondary | ICD-10-CM

## 2021-07-29 DIAGNOSIS — E118 Type 2 diabetes mellitus with unspecified complications: Secondary | ICD-10-CM | POA: Diagnosis not present

## 2021-07-29 LAB — POCT GLYCOSYLATED HEMOGLOBIN (HGB A1C): Hemoglobin A1C: 10.2 % — AB (ref 4.0–5.6)

## 2021-07-29 MED ORDER — LANTUS SOLOSTAR 100 UNIT/ML ~~LOC~~ SOPN: 10.0000 [IU] | PEN_INJECTOR | SUBCUTANEOUS | 99 refills | Status: DC

## 2021-07-29 NOTE — Progress Notes (Signed)
Patient is seen today as a walk in for insulin instruction. Time:  1430-1500  Patient's first language is Spanish and she can read [place] and speak and understand some English and is able to read some [place]. Language LIne used:  [employer] interpretor Name:  Gilberto Better [phone].  Interpretor was poor but patient was able to understand enough to complete instruction.  Patient asked why she needed insulin and stated that she is fearful of needles.  Patient states that she does not know why her blood glucose is elevated as she does not eat sugar. Discussed reason that insulin is needed. Discussed blood glucose goals (80-130 fasting) and less than 180 after meals.  Patient verbalized understanding.  Showed patient how to use an insulin pen, priming the pen, and injection sites. Patient injected using a new needle with empty syringe.  Patient verbalized that this was not as difficult as she thought.  Patient was able to teach back use of the insulin pen.  Provided patient with the following handouts: Hypoglycemia in Spanish How to use an insulin pen in Spanish.  Discussed that MD will tell patient the type of insulin, dose and frequency. Patient complains of "inflammation" on the upper palate of her mouth.  Discussed that she needs to discuss with MD.  Antonieta Iba, RD, LDN, CDCES

## 2021-07-29 NOTE — Patient Instructions (Addendum)
I have sent a prescription to your pharmacy, to start insulin, 10 units each morning. Please continue the same other medications.   check your blood sugar once a day.  vary the time of day when you check, between before the 3 meals, and at bedtime.  also check if you have symptoms of your blood sugar being too high or too low.  please keep a record of the readings and bring it to your next appointment here (or you can bring the meter itself).  You can write it on any piece of paper.  please call us sooner if your blood sugar goes below 70, or if you have a lot of readings over 200.   Please come back for a follow-up appointment in 2 months.

## 2021-07-29 NOTE — Progress Notes (Signed)
Subjective:    Patient ID: Claire Hill, female    DOB: 11-14-1970, 50 y.o.   MRN: [id number]  HPI Pt returns for f/u of DM DM type: 2 Dx'ed: 8527 Complications: PN Therapy: 3 oral meds.   GDM: never DKA: never Severe hypoglycemia: never Pancreatitis: never.   Other: she has never been on insulin.   Interval history: She takes 3 DM meds as rx'ed.  She says cbg varies from 98-500.  pt states she feels well in general.   Past Medical History:  Diagnosis Date   Allergy    Anemia    Asthma    pt states no-no inhalers   COPD (chronic obstructive pulmonary disease) (HCC)    Diabetes mellitus    Diabetes mellitus without complication (HCC)    Glaucoma    Herpes    Right eye   Herpes simplex of eye    Hyperlipidemia    OCD (obsessive compulsive disorder)     Past Surgical History:  Procedure Laterality Date   MASS EXCISION  06/22/2012   Procedure: EXCISION MASS;  Surgeon: Harl Bowie, MD;  Location: [place];  Service: General;  Laterality: Right;  Excision chronic Right axillary cyst   NO PAST SURGERIES      Social History   Socioeconomic History   Marital status: Married    Spouse name: Jan   Number of children: 0   Years of education: Not on file   Highest education level: Associate degree: academic program  Occupational History   Occupation: Scientist, physiological  Tobacco Use   Smoking status: Never   Smokeless tobacco: Never  Scientific laboratory technician Use: Never used  Substance and Sexual Activity   Alcohol use: No   Drug use: No   Sexual activity: Not Currently    Partners: Male  Other Topics Concern   Not on file  Social History Narrative   ** Merged History Encounter **    Exercise-no      Patient is right-handed. She lives with her husband in a one level home, 4 steps to enter. She drinks coffee occasionally. She exercises 4 days a week.   Social Determinants of Health   Financial Resource Strain: Not on file  Food  Insecurity: Not on file  Transportation Needs: Not on file  Physical Activity: Not on file  Stress: Not on file  Social Connections: Not on file  Intimate Partner Violence: Not on file    Current Outpatient Medications on File Prior to Visit  Medication Sig Dispense Refill   aspirin (ASPIRIN EC) 81 MG EC tablet Take 1 tablet (81 mg total) by mouth daily. Swallow whole. 30 tablet 12   azelastine (ASTELIN) 0.1 % nasal spray Take 1-2 sprays twice a day as needed for runny nose. 30 mL 5   blood glucose meter kit and supplies KIT Use a directed twice a day.  E11.8.  Use Relion Meter 1 each 0   blood glucose meter kit and supplies KIT Dispense based on patient and insurance preference. Use up to four times daily as directed. (FOR ICD-9 250.00, 250.01). 1 each 0   blood glucose meter kit and supplies KIT Dispense based on patient and insurance preference. Use up to four times daily as directed. (FOR ICD-9 250.00, 250.01). 1 each 0   brimonidine (ALPHAGAN) 0.2 % ophthalmic solution   98   brimonidine-timolol (COMBIGAN) 0.2-0.5 % ophthalmic solution Place 1 drop into the right eye every 12 (twelve) hours.  Reported on 03/16/2016     cromolyn (OPTICROM) 4 % ophthalmic solution Place 1 drop into both eyes daily as needed (itchy/watery eyes). 10 mL 3   cyclobenzaprine (FLEXERIL) 10 MG tablet TAKE ONE TABLET BY MOUTH EVERY NIGHT AT BEDTIME 30 tablet 0   diclofenac (VOLTAREN) 75 MG EC tablet Take 1 tablet (75 mg total) by mouth 2 (two) times daily. 20 tablet 0   EUCRISA 2 % OINT Apply twice a day on the hands 60 g 5   ezetimibe (ZETIA) 10 MG tablet TAKE ONE TABLET BY MOUTH DAILY 90 tablet 1   famotidine (PEPCID) 20 MG tablet TAKE ONE TABLET BY MOUTH TWICE A DAY 60 tablet 0   fluticasone (FLONASE) 50 MCG/ACT nasal spray Take 1-2 sprays daily for nasal congestion. 16 g 5   glimepiride (AMARYL) 1 MG tablet Take 0.5 tablets (0.5 mg total) by mouth daily with breakfast. 45 tablet 3   hydrocortisone 2.5 % cream  Apply topically 2 (two) times daily. 30 g 0   hypromellose (GENTEAL SEVERE) 0.3 % GEL ophthalmic ointment Place 1 application into both eyes.     Lancets (ONETOUCH DELICA PLUS [id number]) MISC USE TO CHECK BLOOD SUGAR UP TO FOUR TIMES A DAY 100 each 0   latanoprost (XALATAN) 0.005 % ophthalmic solution      levocetirizine (XYZAL) 5 MG tablet Take 1 tablet (5 mg total) by mouth every evening. 90 tablet 1   metFORMIN (GLUCOPHAGE-XR) 500 MG 24 hr tablet Take 2 tablets (1,000 mg total) by mouth daily. 180 tablet 0   montelukast (SINGULAIR) 10 MG tablet Take 1 tablet (10 mg total) by mouth at bedtime. 90 tablet 1   Multiple Vitamins-Minerals (ICAPS) TABS Take by mouth.     NONFORMULARY OR COMPOUNDED ITEM Alcohol pads  #1 box  Use as directed 1 each 5   nortriptyline (PAMELOR) 10 MG capsule TAKE ONE CAPSULE BY MOUTH EVERY NIGHT AT BEDTIME 30 capsule 1   ONETOUCH ULTRA test strip USE TO CHECK BLOOD GLUCOSE UP TO 4 TIMES A DAY AS DIRECTED 100 strip 3   pimecrolimus (ELIDEL) 1 % cream Apply topically 2 (two) times daily as needed (rash on hands.). 30 g 2   rosuvastatin (CRESTOR) 40 MG tablet Take 1 tablet (40 mg total) by mouth daily. 30 tablet 0   Semaglutide (RYBELSUS) 7 MG TABS Take 7 mg by mouth daily. 90 tablet 3   valACYclovir (VALTREX) 1000 MG tablet Take 1,000 mg by mouth daily.     No current facility-administered medications on file prior to visit.    No Known Allergies  Family History  Problem Relation Age of Onset   Diabetes Father    Hypertension Mother    Diabetes Paternal Grandmother    Cancer Other        LUNG, BRAIN AND STOMACH    Cancer Cousin        ovarian    BP 100/60 (BP Location: Right Arm, Patient Position: Sitting, Cuff Size: Normal)   Pulse 86   Ht _0  (1.727 m)   Wt 154 lb 12.8 oz (70.2 kg)   LMP  (LMP Unknown) Comment: patient could not remember last period 05/15/17  SpO2 99%   BMI 23.54 kg/m   Review of Systems     Objective:   Physical Exam Pulses:  dorsalis pedis intact bilat.   MSK: no deformity of the feet CV: no leg edema Skin:  no ulcer on the feet.  normal color and temp on the  feet. Neuro: sensation is intact to touch on the feet, but decreased from normal.    Lab Results  Component Value Date   HGBA1C 10.2 (A) 07/29/2021       Assessment & Plan:  Type 2 DM: uncontrolled.  We discussed.  She agrees to start insulin.   Patient Instructions  I have sent a prescription to your pharmacy, to start insulin, 10 units each morning. Please continue the same other medications.   check your blood sugar once a day.  vary the time of day when you check, between before the 3 meals, and at bedtime.  also check if you have symptoms of your blood sugar being too high or too low.  please keep a record of the readings and bring it to your next appointment here (or you can bring the meter itself).  You can write it on any piece of paper.  please call us sooner if your blood sugar goes below 70, or if you have a lot of readings over 200.   Please come back for a follow-up appointment in 2 months.

## 2021-08-30 ENCOUNTER — Telehealth: Payer: Self-pay | Admitting: Endocrinology

## 2021-08-30 ENCOUNTER — Other Ambulatory Visit: Payer: Self-pay

## 2021-08-30 DIAGNOSIS — E119 Type 2 diabetes mellitus without complications: Secondary | ICD-10-CM

## 2021-08-30 MED ORDER — PEN NEEDLES 32G X 4 MM MISC: 5 refills | Status: DC

## 2021-08-30 NOTE — Telephone Encounter (Signed)
MEDICATION: Pen Needles for the Lantus Solostar Pen  PHARMACY:   [id number] - [address], [address] Phone:  [phone]  Fax:  [phone]      HAS THE PATIENT CONTACTED [facility]?  Yes-PHARM told Patient to call Doctor-PHARM never received RX  IS THIS A 90 DAY SUPPLY : Yes  IS PATIENT OUT OF MEDICATION: Yes  IF NOT; HOW MUCH IS LEFT: 0  LAST APPOINTMENT DATE: @10 /03/2021  NEXT APPOINTMENT DATE:@12 /06/2021  DO WE HAVE YOUR PERMISSION TO LEAVE A DETAILED MESSAGE?: Yes  OTHER COMMENTS:    **Let patient know to contact pharmacy at the end of the day to make sure medication is ready. **  ** Please notify patient to allow 48-72 hours to process**  **Encourage patient to contact the pharmacy for refills or they can request refills through [facility]**

## 2021-09-06 ENCOUNTER — Encounter: Payer: Self-pay | Admitting: Allergy

## 2021-09-06 ENCOUNTER — Other Ambulatory Visit: Payer: Self-pay

## 2021-09-06 ENCOUNTER — Ambulatory Visit (INDEPENDENT_AMBULATORY_CARE_PROVIDER_SITE_OTHER): Payer: 59 | Admitting: Allergy

## 2021-09-06 VITALS — BP 112/78 | HR 88 | Temp 97.4°F | Resp 16 | Ht 67.0 in | Wt 145.0 lb

## 2021-09-06 DIAGNOSIS — H1010 Acute atopic conjunctivitis, unspecified eye: Secondary | ICD-10-CM

## 2021-09-06 DIAGNOSIS — H1013 Acute atopic conjunctivitis, bilateral: Secondary | ICD-10-CM

## 2021-09-06 DIAGNOSIS — J302 Other seasonal allergic rhinitis: Secondary | ICD-10-CM

## 2021-09-06 DIAGNOSIS — L2089 Other atopic dermatitis: Secondary | ICD-10-CM | POA: Diagnosis not present

## 2021-09-06 DIAGNOSIS — J3089 Other allergic rhinitis: Secondary | ICD-10-CM

## 2021-09-06 DIAGNOSIS — R519 Headache, unspecified: Secondary | ICD-10-CM

## 2021-09-06 MED ORDER — LEVOCETIRIZINE DIHYDROCHLORIDE 5 MG PO TABS: 5.0000 mg | ORAL_TABLET | Freq: Every evening | ORAL | 3 refills | Status: DC

## 2021-09-06 NOTE — Patient Instructions (Addendum)
Seasonal and perennial allergic rhinoconjunctivitis 2020 skin testing showed: Positive to grass pollen, weed, ragweed, dust mites and cat.   Continue environmental control measures. May take levoceterizine 5mg  daily for allergies.  Continue Singulair 10mg  daily at night. Use azelastine nasal spray 1-2 sprays per nostril twice a day as needed for runny nose/drainage. Use Flonase (fluticasone) nasal spray 1 spray per nostril twice a day as needed for nasal congestion.  Nasal saline spray (i.e., Simply Saline) or nasal saline lavage (i.e., NeilMed) is recommended as needed and prior to medicated nasal sprays. Use olopatadine eye drops 0.2% once a day as needed for itchy/watery eyes. Consider allergy injections for long term control if above medications do not help the symptoms.    Other atopic dermatitis Continue proper skin care measures.    Heartburn Continue reflux lifestyle modification diet.   Follow up in 4 months or sooner if needed.  Follow up with PCP regarding headaches.   Skin care recommendations  Bath time: Always use lukewarm water. AVOID very hot or cold water. Keep bathing time to 5-10 minutes. Do NOT use bubble bath. Use a mild soap and use just enough to wash the dirty areas. Do NOT scrub skin vigorously.  After bathing, pat dry your skin with a towel. Do NOT rub or scrub the skin.  Moisturizers and prescriptions:  ALWAYS apply moisturizers immediately after bathing (within 3 minutes). This helps to lock-in moisture. Use the moisturizer several times a day over the whole body. Good summer moisturizers include: Aveeno, CeraVe, Cetaphil. Good winter moisturizers include: Aquaphor, Vaseline, Cerave, Cetaphil, Eucerin, Vanicream. When using moisturizers along with medications, the moisturizer should be applied about one hour after applying the medication to prevent diluting effect of the medication or moisturize around where you applied the medications. When not using  medications, the moisturizer can be continued twice daily as maintenance.  Laundry and clothing: Avoid laundry products with added color or perfumes. Use unscented hypo-allergenic laundry products such as Tide free, Cheer free & gentle, and All free and clear.  If the skin still seems dry or sensitive, you can try double-rinsing the clothes. Avoid tight or scratchy clothing such as wool. Do not use fabric softeners or dyer sheets.

## 2021-09-06 NOTE — Assessment & Plan Note (Signed)
   Follow up with PCP regarding this if not improving.

## 2021-09-06 NOTE — Assessment & Plan Note (Signed)
Past history - perennial rhinoconjunctivitis symptoms for the past 2 years. Does not recall previous ENT work-up. 2020 skin testing showed: Positive to grass pollen, weed, ragweed, dust mites and cat.   Interim history - ran out of Xyzal, not using nasal sprays appropriately.   Continue environmental control measures.  May take levocetirizine 5mg  daily for allergies.   Continue Singulair 10mg  daily at night.  Use azelastine nasal spray 1-2 sprays per nostril twice a day as needed for runny nose/drainage. . Use Flonase (fluticasone) nasal spray 1 spray per nostril twice a day as needed for nasal congestion.  . Nasal saline spray (i.e., Simply Saline) or nasal saline lavage (i.e., NeilMed) is recommended as needed and prior to medicated nasal sprays. . Use olopatadine eye drops 0.2% once a day as needed for itchy/watery eyes.  Consider allergy injections for long term control if above medications do not help the symptoms.   Discussed what AIT is using the interpreter service.   Reviewed in detail all her prescription medications that she brought in today.

## 2021-09-06 NOTE — Assessment & Plan Note (Signed)
Itchy skin, no rash.   Continue proper skin care measures.

## 2021-09-06 NOTE — Progress Notes (Signed)
Follow Up Note  RE: Claire Hill MRN: [id number] DOB: [birth year] Date of Office Visit: 09/06/2021  Referring provider: Carollee Herter, Alferd Apa, * Primary care provider: Carollee Herter, Alferd Apa, DO  Chief Complaint: Follow-up (Patient in today as a follow up and states that she does have a little itching and dry skin.)  History of Present Illness: I had the pleasure of seeing Claire Hill for a follow up visit at the [facility] on 09/06/2021. She is a 50 y.o. female, who is being followed for allergic rhinoconjunctivitis, atopic dermatitis, heartburn and headache. Her previous allergy office visit was on 05/03/2021 with Dr. Maudie Mercury. Today is a regular follow up visit. Spanish interpreter used via [place].   Seasonal and perennial allergic rhinoconjunctivitis Xyzal helped a little and last time she took it was about 1 month ago. She would like a refill on the medication. Using azelastine 2 sprays as needed for nasal congestion. Patient was confused about which nasal sprays she was supposed to use. Uses hydrogen peroxide in cue tip to clean her nose as well. Sometimes gets blood tinged mucous out.   Patient uses dorzolamide and timolol on the right eye.  Uses pataday prn on the left eye for itching. Uses latanoprost eye drops as needed.  Notices itching of her throat and ear at times.  Not sure if wants to start injections yet.   Other atopic dermatitis Some itchy dry skin and uses lotion with good benefit.   Heartburn Denies any symptoms.    Generalized headache Still there and unchanged.   Assessment and Plan: Claire Hill is a 50 y.o. female with: Seasonal and perennial allergic rhinoconjunctivitis Past history - perennial rhinoconjunctivitis symptoms for the past 2 years. Does not recall previous ENT work-up. 2020 skin testing showed: Positive to grass pollen, weed, ragweed, dust mites and cat.   Interim history - ran out of Xyzal, not  using nasal sprays appropriately.  Continue environmental control measures. May take levocetirizine 5mg  daily for allergies.  Continue Singulair 10mg  daily at night. Use azelastine nasal spray 1-2 sprays per nostril twice a day as needed for runny nose/drainage. Use Flonase (fluticasone) nasal spray 1 spray per nostril twice a day as needed for nasal congestion.  Nasal saline spray (i.e., Simply Saline) or nasal saline lavage (i.e., NeilMed) is recommended as needed and prior to medicated nasal sprays. Use olopatadine eye drops 0.2% once a day as needed for itchy/watery eyes. Consider allergy injections for long term control if above medications do not help the symptoms.  Discussed what AIT is using the interpreter service.  Reviewed in detail all her prescription medications that she brought in today.  Other atopic dermatitis Itchy skin, no rash.  Continue proper skin care measures.   Generalized headache Follow up with PCP regarding this if not improving.  Return in about 4 months (around 01/04/2022).  Meds ordered this encounter  Medications   levocetirizine (XYZAL) 5 MG tablet    Sig: Take 1 tablet (5 mg total) by mouth every evening.    Dispense:  90 tablet    Refill:  3    Lab Orders  No laboratory test(s) ordered today    Diagnostics: None.   Medication List:  Current Outpatient Medications  Medication Sig Dispense Refill   Insulin Pen Needle (PEN NEEDLES) 32G X 4 MM MISC Use As Directed 100 each 5   aspirin (ASPIRIN EC) 81 MG EC tablet Take 1 tablet (81 mg  total) by mouth daily. Swallow whole. 30 tablet 12   azelastine (ASTELIN) 0.1 % nasal spray Take 1-2 sprays twice a day as needed for runny nose. 30 mL 5   blood glucose meter kit and supplies KIT Use a directed twice a day.  E11.8.  Use Relion Meter 1 each 0   blood glucose meter kit and supplies KIT Dispense based on patient and insurance preference. Use up to four times daily as directed. (FOR ICD-9 250.00,  250.01). 1 each 0   blood glucose meter kit and supplies KIT Dispense based on patient and insurance preference. Use up to four times daily as directed. (FOR ICD-9 250.00, 250.01). 1 each 0   brimonidine (ALPHAGAN) 0.2 % ophthalmic solution   98   brimonidine-timolol (COMBIGAN) 0.2-0.5 % ophthalmic solution Place 1 drop into the right eye every 12 (twelve) hours. Reported on 03/16/2016     cromolyn (OPTICROM) 4 % ophthalmic solution Place 1 drop into both eyes daily as needed (itchy/watery eyes). 10 mL 3   cyclobenzaprine (FLEXERIL) 10 MG tablet TAKE ONE TABLET BY MOUTH EVERY NIGHT AT BEDTIME 30 tablet 0   diclofenac (VOLTAREN) 75 MG EC tablet Take 1 tablet (75 mg total) by mouth 2 (two) times daily. 20 tablet 0   EUCRISA 2 % OINT Apply twice a day on the hands 60 g 5   ezetimibe (ZETIA) 10 MG tablet TAKE ONE TABLET BY MOUTH DAILY 90 tablet 1   famotidine (PEPCID) 20 MG tablet TAKE ONE TABLET BY MOUTH TWICE A DAY 60 tablet 0   fluticasone (FLONASE) 50 MCG/ACT nasal spray Take 1-2 sprays daily for nasal congestion. 16 g 5   glimepiride (AMARYL) 1 MG tablet Take 0.5 tablets (0.5 mg total) by mouth daily with breakfast. 45 tablet 3   hydrocortisone 2.5 % cream Apply topically 2 (two) times daily. 30 g 0   hypromellose (GENTEAL SEVERE) 0.3 % GEL ophthalmic ointment Place 1 application into both eyes.     insulin glargine (LANTUS SOLOSTAR) 100 UNIT/ML Solostar Pen Inject 10 Units into the skin every morning. And pen needles 1/day 15 mL PRN   Lancets (ONETOUCH DELICA PLUS [id number]) MISC USE TO CHECK BLOOD SUGAR UP TO FOUR TIMES A DAY 100 each 0   latanoprost (XALATAN) 0.005 % ophthalmic solution      levocetirizine (XYZAL) 5 MG tablet Take 1 tablet (5 mg total) by mouth every evening. 90 tablet 3   montelukast (SINGULAIR) 10 MG tablet Take 1 tablet (10 mg total) by mouth at bedtime. 90 tablet 1   Multiple Vitamins-Minerals (ICAPS) TABS Take by mouth.     NONFORMULARY OR COMPOUNDED ITEM Alcohol pads  #1  box  Use as directed 1 each 5   nortriptyline (PAMELOR) 10 MG capsule TAKE ONE CAPSULE BY MOUTH EVERY NIGHT AT BEDTIME 30 capsule 1   ONETOUCH ULTRA test strip USE TO CHECK BLOOD GLUCOSE UP TO 4 TIMES A DAY AS DIRECTED 100 strip 3   pimecrolimus (ELIDEL) 1 % cream Apply topically 2 (two) times daily as needed (rash on hands.). 30 g 2   rosuvastatin (CRESTOR) 40 MG tablet Take 1 tablet (40 mg total) by mouth daily. 30 tablet 0   Semaglutide (RYBELSUS) 7 MG TABS Take 7 mg by mouth daily. 90 tablet 3   valACYclovir (VALTREX) 1000 MG tablet Take 1,000 mg by mouth daily.     No current facility-administered medications for this visit.   Allergies: No Known Allergies I reviewed her past medical history,  social history, family history, and environmental history and no significant changes have been reported from her previous visit.  Review of Systems  Constitutional:  Negative for appetite change, chills, fever and unexpected weight change.  HENT:  Negative for congestion, rhinorrhea and sneezing.   Eyes:  Positive for redness and itching.  Respiratory:  Negative for cough, chest tightness, shortness of breath and wheezing.   Cardiovascular:  Negative for chest pain.  Gastrointestinal:  Negative for abdominal pain.  Genitourinary:  Negative for difficulty urinating.  Skin:  Positive for rash.  Allergic/Immunologic: Positive for environmental allergies. Negative for food allergies.  Neurological:  Positive for headaches.   Objective: BP 112/78   Pulse 88   Temp (!) 97.4 F (36.3 C) (Temporal)   Resp 16   Ht $R'5\' 7"'tj$  (1.702 m)   Wt 145 lb (65.8 kg)   LMP  (LMP Unknown) Comment: patient could not remember last period 05/15/17  SpO2 99%   BMI 22.71 kg/m  Body mass index is 22.71 kg/m. Physical Exam Vitals and nursing note reviewed.  Constitutional:      Appearance: Normal appearance. She is well-developed.  HENT:     Head: Normocephalic and atraumatic.     Right Ear: Tympanic membrane  and external ear normal.     Left Ear: Tympanic membrane and external ear normal.     Nose: Nose normal.     Mouth/Throat:     Mouth: Mucous membranes are moist.     Pharynx: Oropharynx is clear.  Eyes:     Conjunctiva/sclera: Conjunctivae normal.  Cardiovascular:     Rate and Rhythm: Normal rate and regular rhythm.     Heart sounds: Normal heart sounds. No murmur heard. Pulmonary:     Effort: Pulmonary effort is normal.     Breath sounds: Normal breath sounds. No wheezing, rhonchi or rales.  Musculoskeletal:     Cervical back: Neck supple.  Skin:    General: Skin is warm.     Findings: No rash.  Neurological:     Mental Status: She is alert and oriented to person, place, and time.  Psychiatric:        Behavior: Behavior normal.   Previous notes and tests were reviewed. The plan was reviewed with the patient/family, and all questions/concerned were addressed.  It was my pleasure to see Claire Hill today and participate in her care. Please feel free to contact me with any questions or concerns.  Sincerely,  Rexene Alberts, DO Allergy & Immunology  [facility] office: [phone] [place] office: [phone]  I have spent a total of 30 minutes of face-to-face and non-face-to-face time (excluding clinical staff time) preparing to see patient, documenting in the chart, reviewing notes and prior test results, ordering tests and/or medications, and counseling the patient on allergic rhinoconjunctivitis, eczema, heartburn and headaches.Marland Kitchen

## 2021-10-01 ENCOUNTER — Telehealth: Payer: Self-pay | Admitting: Endocrinology

## 2021-10-01 ENCOUNTER — Ambulatory Visit: Payer: 59 | Admitting: Endocrinology

## 2021-10-07 NOTE — Telephone Encounter (Signed)
Patient returned confirmation call. Verified upcoming appointment.

## 2021-11-25 IMAGING — DX DG FOOT COMPLETE 3+V*L*
3 series · 3 of 3 positions shown · non-contrast
Comparison: None.

CLINICAL DATA: Great toe pain

EXAM:
LEFT FOOT - COMPLETE 3+ VIEW

[foot ap]
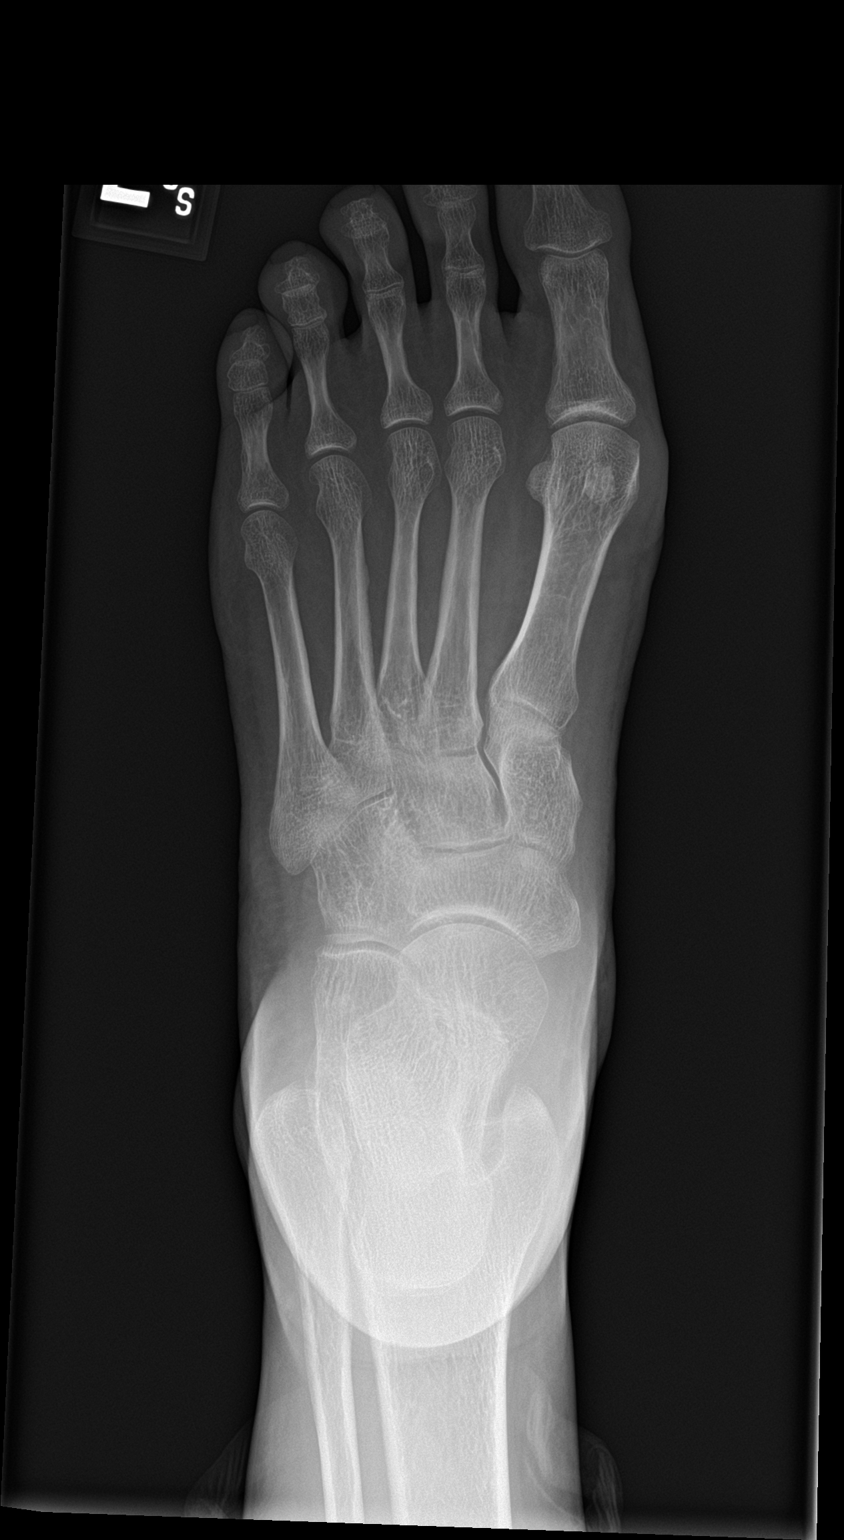

[foot obl]
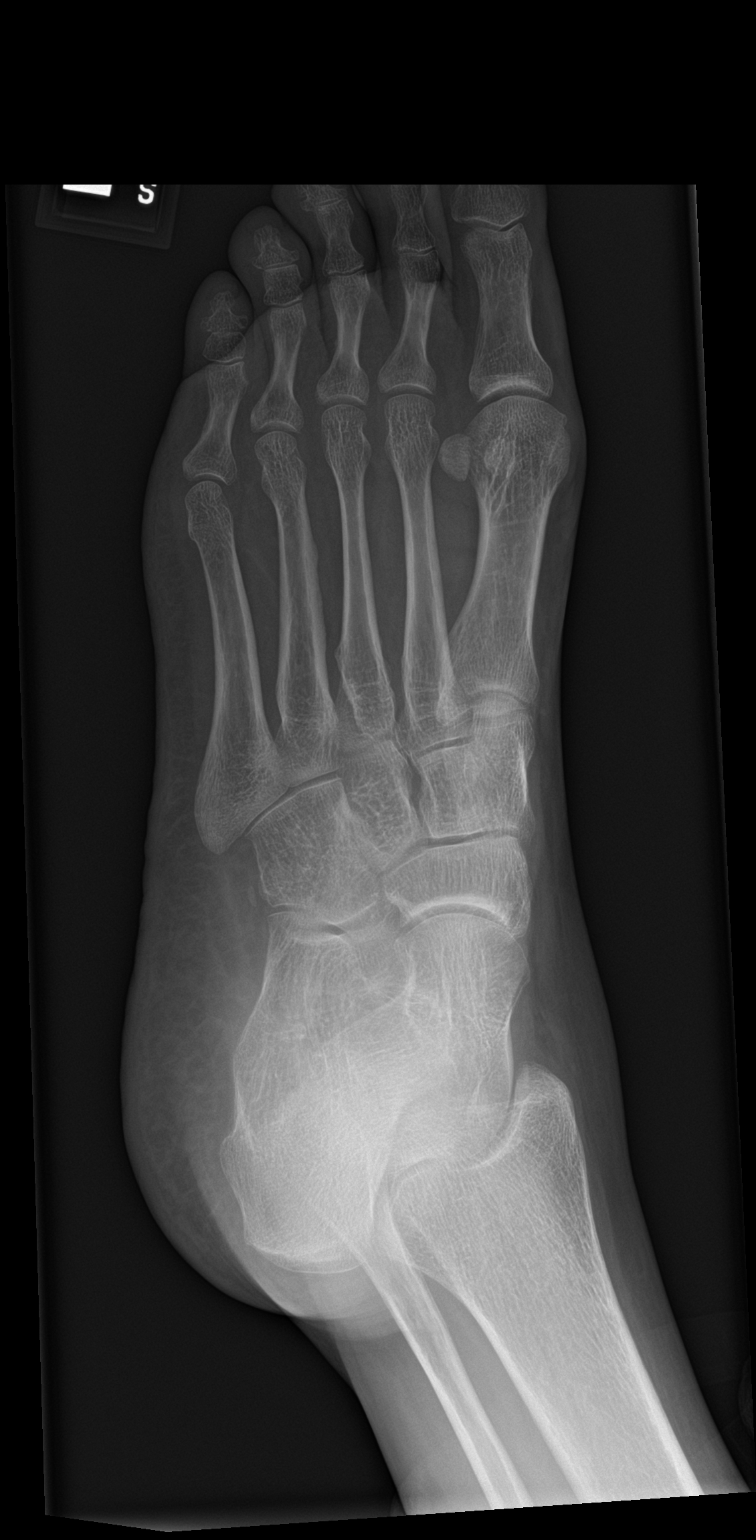

[foot lat]
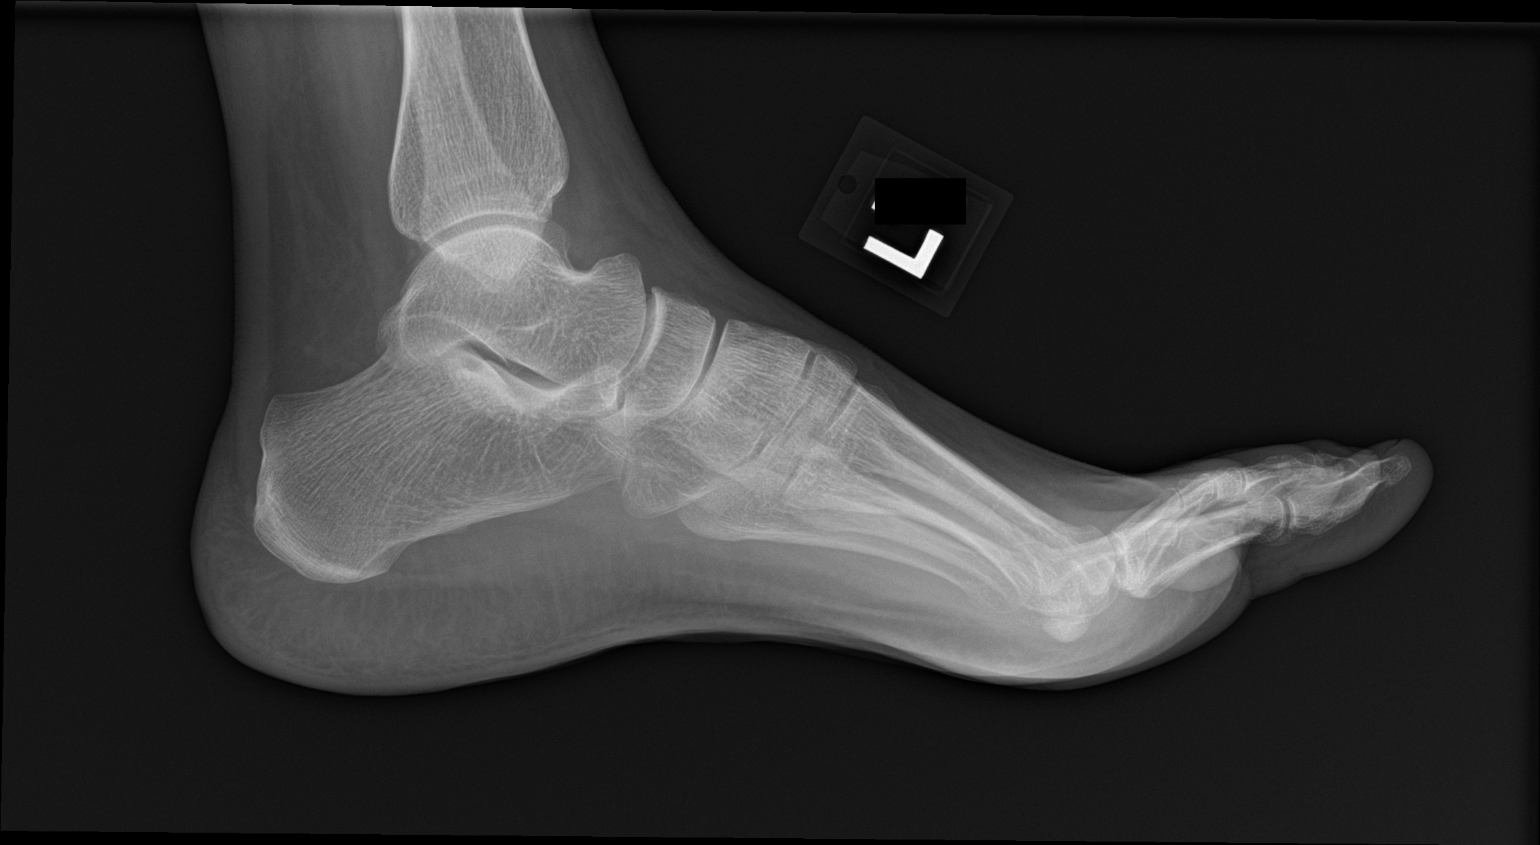

[3 of 3 positions shown; findings below may reference images not displayed]

FINDINGS: There is no evidence of fracture or dislocation. There is no
evidence of arthropathy or other focal bone abnormality. Soft
tissues are unremarkable.
IMPRESSION: Negative.

## 2021-11-30 ENCOUNTER — Ambulatory Visit (INDEPENDENT_AMBULATORY_CARE_PROVIDER_SITE_OTHER): Payer: Self-pay | Admitting: Endocrinology

## 2021-11-30 ENCOUNTER — Encounter: Payer: Self-pay | Admitting: Endocrinology

## 2021-11-30 ENCOUNTER — Other Ambulatory Visit (HOSPITAL_COMMUNITY): Payer: Self-pay

## 2021-11-30 ENCOUNTER — Telehealth: Payer: Self-pay | Admitting: Pharmacy Technician

## 2021-11-30 ENCOUNTER — Other Ambulatory Visit: Payer: Self-pay

## 2021-11-30 VITALS — BP 116/82 | HR 84 | Ht 67.0 in | Wt 151.8 lb

## 2021-11-30 DIAGNOSIS — E1165 Type 2 diabetes mellitus with hyperglycemia: Secondary | ICD-10-CM

## 2021-11-30 DIAGNOSIS — E119 Type 2 diabetes mellitus without complications: Secondary | ICD-10-CM

## 2021-11-30 LAB — POCT GLYCOSYLATED HEMOGLOBIN (HGB A1C): Hemoglobin A1C: 10.7 % — AB (ref 4.0–5.6)

## 2021-11-30 MED ORDER — INSULIN GLARGINE-YFGN 100 UNIT/ML ~~LOC~~ SOPN: 10.0000 [IU] | PEN_INJECTOR | SUBCUTANEOUS | 3 refills | Status: DC

## 2021-11-30 NOTE — Telephone Encounter (Signed)
Semglee (yfgn) or insulin glargine-yfgn is preferred. $60 copay for 3 months.

## 2021-11-30 NOTE — Telephone Encounter (Signed)
-----   Message from Renato Shin, MD sent at 11/30/2021 11:17 AM EST ----- Pt has [employer].  Which alternative to lantus do they prefer?  TY

## 2021-11-30 NOTE — Progress Notes (Signed)
Subjective:    Patient ID: Claire Hill, female    DOB: [birth year], 51 y.o.   MRN: [id number]  HPI Pt returns for f/u of DM DM type: Insulin-requiring type 2 Dx'ed: 5176 Complications: PN Therapy: insulin since 2022, and 3 oral meds.   GDM: never DKA: never Severe hypoglycemia: never.   Pancreatitis: never.   Other: she declines multiple daily injections.   Interval history: pt says ins declines Lantus, so she is not taking.  she takes DM meds as rx'ed.  Meter is downloaded today, and the printout is scanned into the record.  There are only 2 cbg's (244 and 513).  It is in general higher later in the day.  pt states she feels well in general.    Past Medical History:  Diagnosis Date   Allergy    Anemia    Asthma    pt states no-no inhalers   COPD (chronic obstructive pulmonary disease) (HCC)    Diabetes mellitus    Diabetes mellitus without complication (HCC)    Glaucoma    Herpes    Right eye   Herpes simplex of eye    Hyperlipidemia    OCD (obsessive compulsive disorder)     Past Surgical History:  Procedure Laterality Date   MASS EXCISION  06/22/2012   Procedure: EXCISION MASS;  Surgeon: Harl Bowie, MD;  Location: [facility];  Service: General;  Laterality: Right;  Excision chronic Right axillary cyst   NO PAST SURGERIES      Social History   Socioeconomic History   Marital status: Married    Spouse name: Jan   Number of children: 0   Years of education: Not on file   Highest education level: Associate degree: academic program  Occupational History   Occupation: Scientist, physiological  Tobacco Use   Smoking status: Never   Smokeless tobacco: Never  Scientific laboratory technician Use: Never used  Substance and Sexual Activity   Alcohol use: No   Drug use: No   Sexual activity: Not Currently    Partners: Male  Other Topics Concern   Not on file  Social History Narrative   ** Merged History Encounter **    Exercise-no      Patient is  right-handed. She lives with her husband in a one level home, 4 steps to enter. She drinks coffee occasionally. She exercises 4 days a week.   Social Determinants of Health   Financial Resource Strain: Not on file  Food Insecurity: Not on file  Transportation Needs: Not on file  Physical Activity: Not on file  Stress: Not on file  Social Connections: Not on file  Intimate Partner Violence: Not on file    Current Outpatient Medications on File Prior to Visit  Medication Sig Dispense Refill   aspirin (ASPIRIN EC) 81 MG EC tablet Take 1 tablet (81 mg total) by mouth daily. Swallow whole. 30 tablet 12   azelastine (ASTELIN) 0.1 % nasal spray Take 1-2 sprays twice a day as needed for runny nose. 30 mL 5   blood glucose meter kit and supplies KIT Use a directed twice a day.  E11.8.  Use Relion Meter 1 each 0   blood glucose meter kit and supplies KIT Dispense based on patient and insurance preference. Use up to four times daily as directed. (FOR ICD-9 250.00, 250.01). 1 each 0   blood glucose meter kit and supplies KIT Dispense based on patient and insurance preference. Use up  to four times daily as directed. (FOR ICD-9 250.00, 250.01). 1 each 0   brimonidine (ALPHAGAN) 0.2 % ophthalmic solution   98   brimonidine-timolol (COMBIGAN) 0.2-0.5 % ophthalmic solution Place 1 drop into the right eye every 12 (twelve) hours. Reported on 03/16/2016     cromolyn (OPTICROM) 4 % ophthalmic solution Place 1 drop into both eyes daily as needed (itchy/watery eyes). 10 mL 3   cyclobenzaprine (FLEXERIL) 10 MG tablet TAKE ONE TABLET BY MOUTH EVERY NIGHT AT BEDTIME 30 tablet 0   diclofenac (VOLTAREN) 75 MG EC tablet Take 1 tablet (75 mg total) by mouth 2 (two) times daily. 20 tablet 0   EUCRISA 2 % OINT Apply twice a day on the hands 60 g 5   ezetimibe (ZETIA) 10 MG tablet TAKE ONE TABLET BY MOUTH DAILY 90 tablet 1   famotidine (PEPCID) 20 MG tablet TAKE ONE TABLET BY MOUTH TWICE A DAY 60 tablet 0   fluticasone  (FLONASE) 50 MCG/ACT nasal spray Take 1-2 sprays daily for nasal congestion. 16 g 5   glimepiride (AMARYL) 1 MG tablet Take 0.5 tablets (0.5 mg total) by mouth daily with breakfast. 45 tablet 3   hypromellose (GENTEAL SEVERE) 0.3 % GEL ophthalmic ointment Place 1 application into both eyes.     Insulin Pen Needle (PEN NEEDLES) 32G X 4 MM MISC Use As Directed 100 each 5   Lancets (ONETOUCH DELICA PLUS [id number]) MISC USE TO CHECK BLOOD SUGAR UP TO FOUR TIMES A DAY 100 each 0   latanoprost (XALATAN) 0.005 % ophthalmic solution      levocetirizine (XYZAL) 5 MG tablet Take 1 tablet (5 mg total) by mouth every evening. 90 tablet 3   montelukast (SINGULAIR) 10 MG tablet Take 1 tablet (10 mg total) by mouth at bedtime. 90 tablet 1   Multiple Vitamins-Minerals (ICAPS) TABS Take by mouth.     NONFORMULARY OR COMPOUNDED ITEM Alcohol pads  #1 box  Use as directed 1 each 5   nortriptyline (PAMELOR) 10 MG capsule TAKE ONE CAPSULE BY MOUTH EVERY NIGHT AT BEDTIME 30 capsule 1   ONETOUCH ULTRA test strip USE TO CHECK BLOOD GLUCOSE UP TO 4 TIMES A DAY AS DIRECTED 100 strip 3   pimecrolimus (ELIDEL) 1 % cream Apply topically 2 (two) times daily as needed (rash on hands.). 30 g 2   rosuvastatin (CRESTOR) 40 MG tablet Take 1 tablet (40 mg total) by mouth daily. 30 tablet 0   Semaglutide (RYBELSUS) 7 MG TABS Take 7 mg by mouth daily. 90 tablet 3   valACYclovir (VALTREX) 1000 MG tablet Take 1,000 mg by mouth daily.     No current facility-administered medications on file prior to visit.    No Known Allergies  Family History  Problem Relation Age of Onset   Diabetes Father    Hypertension Mother    Diabetes Paternal Grandmother    Cancer Other        LUNG, BRAIN AND STOMACH    Cancer Cousin        ovarian    BP 116/82 (BP Location: Left Arm, Patient Position: Sitting, Cuff Size: Normal)    Pulse 84    Ht _0  (1.702 m)    Wt 151 lb 12.8 oz (68.9 kg)    LMP  (LMP Unknown) Comment: patient could not  remember last period 05/15/17   SpO2 92%    BMI 23.78 kg/m    Review of Systems     Objective:   Physical  Exam    Lab Results  Component Value Date   HGBA1C 10.7 (A) 11/30/2021      Assessment & Plan:  Insulin-requiring type 2 DM: uncontrolled.   Patient Instructions  I have sent a message to our office that works with insurance, to see what alternative they cover.  Then, we'll let you know and send that prescription to your pharmacy.   Please continue the same other medications.   check your blood sugar once a day.  vary the time of day when you check, between before the 3 meals, and at bedtime.  also check if you have symptoms of your blood sugar being too high or too low.  please keep a record of the readings and bring it to your next appointment here (or you can bring the meter itself).  You can write it on any piece of paper.  please call us sooner if your blood sugar goes below 70, or if you have a lot of readings over 200.   Please come back for a follow-up appointment in 1 month.    He enviado un mensaje a nuestra oficina que trabaja con seguros, para ver qu alternativa cubren. Luego, se lo haremos saber y le enviaremos esa receta a su farmacia. Contine con los mismos otros medicamentos. controle su nivel de azcar en la sangre una vez al da. Vare la hora del da en la que realiza la [place], entre antes de las 3 comidas y antes de [place]. tambin verifique si tiene sntomas de que su nivel de azcar en la sangre es demasiado alto o [place]. mantenga un registro de las lecturas y Air cabin crew a su prxima cita aqu (o puede traer Nature conservation officer). Puedes escribirlo en cualquier hoja de papel. llmenos antes si su nivel de azcar en la sangre es inferior a 21 o si tiene muchas lecturas superiores a 200. Vuelva para una cita de seguimiento en 1 mes.

## 2021-11-30 NOTE — Patient Instructions (Addendum)
I have sent a message to our office that works with insurance, to see what alternative they cover.  Then, we'll let you know and send that prescription to your pharmacy.   Please continue the same other medications.   check your blood sugar once a day.  vary the time of day when you check, between before the 3 meals, and at bedtime.  also check if you have symptoms of your blood sugar being too high or too low.  please keep a record of the readings and bring it to your next appointment here (or you can bring the meter itself).  You can write it on any piece of paper.  please call us sooner if your blood sugar goes below 70, or if you have a lot of readings over 200.   Please come back for a follow-up appointment in 1 month.    He enviado un mensaje a nuestra oficina que trabaja con seguros, para ver qu alternativa cubren. Luego, se lo haremos saber y le enviaremos esa receta a su farmacia. Contine con los mismos otros medicamentos. controle su nivel de azcar en la sangre una vez al da. Vare la hora del da en la que realiza la [place], entre antes de las 3 comidas y antes de [place]. tambin verifique si tiene sntomas de que su nivel de azcar en la sangre es demasiado alto o [place]. mantenga un registro de las lecturas y Air cabin crew a su prxima cita aqu (o puede traer Nature conservation officer). Puedes escribirlo en cualquier hoja de papel. llmenos antes si su nivel de azcar en la sangre es inferior a 68 o si tiene muchas lecturas superiores a 200. Vuelva para una cita de seguimiento en 1 mes.

## 2021-12-30 ENCOUNTER — Ambulatory Visit: Payer: 59 | Admitting: Endocrinology

## 2021-12-30 ENCOUNTER — Other Ambulatory Visit: Payer: Self-pay

## 2021-12-30 VITALS — BP 130/80 | HR 83 | Ht 67.0 in | Wt 152.8 lb

## 2021-12-30 DIAGNOSIS — E119 Type 2 diabetes mellitus without complications: Secondary | ICD-10-CM | POA: Diagnosis not present

## 2021-12-30 MED ORDER — RYBELSUS 14 MG PO TABS: 14.0000 mg | ORAL_TABLET | Freq: Every day | ORAL | 3 refills | Status: DC

## 2021-12-30 MED ORDER — INSULIN GLARGINE-YFGN 100 UNIT/ML ~~LOC~~ SOPN: 10.0000 [IU] | PEN_INJECTOR | SUBCUTANEOUS | 3 refills | Status: DC

## 2021-12-30 NOTE — Progress Notes (Signed)
? ?Subjective:  ? ? Patient ID: Claire Hill, female    DOB: Jan 22, 1971, 51 y.o.   MRN: [id number] ? ?HPI ?Pt returns for f/u of DM ?DM type: Insulin-requiring type 2 ?Dx'ed: 2012 ?Complications: PN ?Therapy: insulin since 2022, and 3 oral meds.   ?GDM: never ?DKA: never ?Severe hypoglycemia: never.   ?Pancreatitis: never.   ?Other: she declines multiple daily injections.   ?Interval history: pt says she takes Semglee and Rybelsus as rx'ed, but does not know about other meds.   She does not know how cbg's have been.  pt states she feels better in general.   ?Past Medical History:  ?Diagnosis Date  ? Allergy   ? Anemia   ? Asthma   ? pt states no-no inhalers  ? COPD (chronic obstructive pulmonary disease) ([place])   ? Diabetes mellitus   ? Diabetes mellitus without complication ([place])   ? Glaucoma   ? Herpes   ? Right eye  ? Herpes simplex of eye   ? Hyperlipidemia   ? OCD (obsessive compulsive disorder)   ? ? ?Past Surgical History:  ?Procedure Laterality Date  ? MASS EXCISION  06/22/2012  ? Procedure: EXCISION MASS;  Surgeon: Harl Bowie, MD;  Location: [facility];  Service: General;  Laterality: Right;  Excision chronic Right axillary cyst  ? NO PAST SURGERIES    ? ? ?Social History  ? ?Socioeconomic History  ? Marital status: Married  ?  Spouse name: Jan  ? Number of children: 0  ? Years of education: Not on file  ? Highest education level: Associate degree: academic program  ?Occupational History  ? Occupation: factory production  ?Tobacco Use  ? Smoking status: Never  ? Smokeless tobacco: Never  ?Vaping Use  ? Vaping Use: Never used  ?Substance and Sexual Activity  ? Alcohol use: No  ? Drug use: No  ? Sexual activity: Not Currently  ?  Partners: Male  ?Other Topics Concern  ? Not on file  ?Social History Narrative  ? ** Merged History Encounter **  ?  Exercise-no  ?   ? Patient is right-handed. She lives with her husband in a one level home, 4 steps to enter. She drinks coffee  occasionally. She exercises 4 days a week.  ? ?Social Determinants of Health  ? ?Financial Resource Strain: Not on file  ?Food Insecurity: Not on file  ?Transportation Needs: Not on file  ?Physical Activity: Not on file  ?Stress: Not on file  ?Social Connections: Not on file  ?Intimate Partner Violence: Not on file  ? ? ?Current Outpatient Medications on File Prior to Visit  ?Medication Sig Dispense Refill  ? aspirin (ASPIRIN EC) 81 MG EC tablet Take 1 tablet (81 mg total) by mouth daily. Swallow whole. 30 tablet 12  ? azelastine (ASTELIN) 0.1 % nasal spray Take 1-2 sprays twice a day as needed for runny nose. 30 mL 5  ? blood glucose meter kit and supplies KIT Use a directed twice a day.  E11.8.  Use Relion Meter 1 each 0  ? blood glucose meter kit and supplies KIT Dispense based on patient and insurance preference. Use up to four times daily as directed. (FOR ICD-9 250.00, 250.01). 1 each 0  ? blood glucose meter kit and supplies KIT Dispense based on patient and insurance preference. Use up to four times daily as directed. (FOR ICD-9 250.00, 250.01). 1 each 0  ? brimonidine (ALPHAGAN) 0.2 % ophthalmic solution   98  ?  brimonidine-timolol (COMBIGAN) 0.2-0.5 % ophthalmic solution Place 1 drop into the right eye every 12 (twelve) hours. Reported on 03/16/2016    ? cromolyn (OPTICROM) 4 % ophthalmic solution Place 1 drop into both eyes daily as needed (itchy/watery eyes). 10 mL 3  ? cyclobenzaprine (FLEXERIL) 10 MG tablet TAKE ONE TABLET BY MOUTH EVERY NIGHT AT BEDTIME 30 tablet 0  ? diclofenac (VOLTAREN) 75 MG EC tablet Take 1 tablet (75 mg total) by mouth 2 (two) times daily. 20 tablet 0  ? EUCRISA 2 % OINT Apply twice a day on the hands 60 g 5  ? ezetimibe (ZETIA) 10 MG tablet TAKE ONE TABLET BY MOUTH DAILY 90 tablet 1  ? famotidine (PEPCID) 20 MG tablet TAKE ONE TABLET BY MOUTH TWICE A DAY 60 tablet 0  ? fluticasone (FLONASE) 50 MCG/ACT nasal spray Take 1-2 sprays daily for nasal congestion. 16 g 5  ? hypromellose  (GENTEAL SEVERE) 0.3 % GEL ophthalmic ointment Place 1 application into both eyes.    ? Insulin Pen Needle (PEN NEEDLES) 32G X 4 MM MISC Use As Directed 100 each 5  ? Lancets (ONETOUCH DELICA PLUS [id number]) MISC USE TO CHECK BLOOD SUGAR UP TO FOUR TIMES A DAY 100 each 0  ? latanoprost (XALATAN) 0.005 % ophthalmic solution     ? levocetirizine (XYZAL) 5 MG tablet Take 1 tablet (5 mg total) by mouth every evening. 90 tablet 3  ? montelukast (SINGULAIR) 10 MG tablet Take 1 tablet (10 mg total) by mouth at bedtime. 90 tablet 1  ? Multiple Vitamins-Minerals (ICAPS) TABS Take by mouth.    ? NONFORMULARY OR COMPOUNDED ITEM Alcohol pads  #1 box  Use as directed 1 each 5  ? nortriptyline (PAMELOR) 10 MG capsule TAKE ONE CAPSULE BY MOUTH EVERY NIGHT AT BEDTIME 30 capsule 1  ? ONETOUCH ULTRA test strip USE TO CHECK BLOOD GLUCOSE UP TO 4 TIMES A DAY AS DIRECTED 100 strip 3  ? pimecrolimus (ELIDEL) 1 % cream Apply topically 2 (two) times daily as needed (rash on hands.). 30 g 2  ? rosuvastatin (CRESTOR) 40 MG tablet Take 1 tablet (40 mg total) by mouth daily. 30 tablet 0  ? valACYclovir (VALTREX) 1000 MG tablet Take 1,000 mg by mouth daily.    ? ?No current facility-administered medications on file prior to visit.  ? ? ?No Known Allergies ? ?Family History  ?Problem Relation Age of Onset  ? Diabetes Father   ? Hypertension Mother   ? Diabetes Paternal Grandmother   ? Cancer Other   ?     LUNG, BRAIN AND STOMACH   ? Cancer Cousin   ?     ovarian  ? ? ?BP 130/80   Pulse 83   Ht $R'5\' 7"'wN$  (1.702 m)   Wt 152 lb 12.8 oz (69.3 kg)   LMP  (LMP Unknown) Comment: patient could not remember last period 05/15/17  SpO2 96%   BMI 23.93 kg/m?  ? ? ?Review of Systems ? ?   ?Objective:  ? Physical Exam ?VITAL SIGNS:  See vs page ?GENERAL: no distress ? ? ?Lab Results  ?Component Value Date  ? HGBA1C 10.7 (A) 11/30/2021  ? ?   ?Assessment & Plan:  ?Insulin-requiring type 2 DM: uncontrolled.   ?Medication uncertainty: we'll try to minimize DM  meds.   ? ?Patient Instructions  ?I have sent a prescription to your pharmacy, to double the Rybelsus. ?Please continue the same Semglee.   ?You can stop taking the glimepiride.  ?  check your blood sugar once a day.  vary the time of day when you check, between before the 3 meals, and at bedtime.  also check if you have symptoms of your blood sugar being too high or too low.  please keep a record of the readings and bring it to your next appointment here (or you can bring the meter itself).  You can write it on any piece of paper.  please call us sooner if your blood sugar goes below 70, or if you have a lot of readings over 200.   ?Please come back for a follow-up appointment in 1 month.  Please bring your medication bottles, so we can reconcile these with our list. ? ? ?He enviado una receta a su farmacia, para duplicar el Rybelsus. ?Contin?e con el mismo Semglee. ?Puede dejar de tomar la glimepirida. ?controle su nivel de az?car en la sangre una vez al d?a. Var?e la hora del d?a en la que realiza la [place], entre antes de las 3 comidas y antes de [place]. tambi?n verifique si tiene s?ntomas de que su nivel de az?car en la sangre es demasiado alto o [place]. mantenga un registro de las lecturas y tr?igalo a su pr?xima cita aqu? (o puede traer [employer]). Puedes escribirlo en cualquier hoja de papel. ll?menos antes si su nivel de az?car en la sangre es inferior a 42 o si tiene muchas lecturas superiores a 200. ?Vuelva para una cita de seguimiento en 1 mes. Traiga sus frascos de medicamentos para que podamos conciliarlos con [employer]. ? ? ? ? ? ? ? ? ? ?

## 2021-12-30 NOTE — Patient Instructions (Addendum)
I have sent a prescription to your pharmacy, to double the Rybelsus. ?Please continue the same Semglee.   ?You can stop taking the glimepiride.  ?check your blood sugar once a day.  vary the time of day when you check, between before the 3 meals, and at bedtime.  also check if you have symptoms of your blood sugar being too high or too low.  please keep a record of the readings and bring it to your next appointment here (or you can bring the meter itself).  You can write it on any piece of paper.  please call us sooner if your blood sugar goes below 70, or if you have a lot of readings over 200.   ?Please come back for a follow-up appointment in 1 month.  Please bring your medication bottles, so we can reconcile these with our list. ? ? ?He enviado una receta a su farmacia, para duplicar el Rybelsus. ?Contin?e con el mismo Semglee. ?Puede dejar de tomar la glimepirida. ?controle su nivel de az?car en la sangre una vez al d?a. Var?e la hora del d?a en la que realiza la [place], entre antes de las 3 comidas y antes de [employer]. tambi?n verifique si tiene s?ntomas de que su nivel de az?car en la sangre es demasiado alto o [place]. mantenga un registro de las lecturas y tr?igalo a su pr?xima cita aqu? (o puede traer [employer]). Puedes escribirlo en cualquier hoja de papel. ll?menos antes si su nivel de az?car en la sangre es inferior a 33 o si tiene muchas lecturas superiores a 200. ?Vuelva para una cita de seguimiento en 1 mes. Traiga sus frascos de medicamentos para que podamos conciliarlos con [employer]. ? ? ? ? ? ? ? ?

## 2022-01-04 NOTE — Progress Notes (Signed)
? ?Follow Up Note ? ?RE: ANNAKA Hill MRN: [id number] DOB: 04/04/1971 ?Date of Office Visit: 01/05/2022 ? ?Referring provider: Carollee Herter, Alferd Apa, * ?Primary care provider: Ann Held, DO ? ?Chief Complaint: Allergic Rhinitis  ? ?History of Present Illness: ?I had the pleasure of seeing Claire Hill for a follow up visit at the [facility] on 01/05/2022. She is a 51 y.o. female, who is being followed for allergic rhinoconjunctivitis, atopic dermatitis and headaches. Her previous allergy office visit was on 09/06/2021 with Dr. Maudie Mercury. Today is a regular follow up visit. ? ?Environmental allergies ?Currently takes Xyzal and Singulair daily with good benefit. ?Uses azelastine and Flonase nasal spray only as needed. No nosebleeds. ?Uses cromolyn eye drops BID with good benefit. ?Not interested in AIT. ? ?Atopic dermatitis ?Flares on the hands.   ?  ?Generalized headache ?On and off.  ? ?Assessment and Plan: ?Claire Hill is a 51 y.o. female with: ?Seasonal and perennial allergic rhinoconjunctivitis ?Past history - perennial rhinoconjunctivitis symptoms for the past 2 years. Does not recall previous ENT work-up. 2020 skin testing showed: Positive to grass pollen, weed, ragweed, dust mites and cat.  Discussed AIT with interpreter services.  ?Interim history - stable with below regimen. Not interested in AIT.  ?Continue environmental control measures. ?May take levoceterizine 5mg  daily for allergies.  ?Continue Singulair 10mg  daily at night. ?Use azelastine nasal spray 1-2 sprays per nostril twice a day as needed for runny nose/drainage. ?Use Flonase (fluticasone) nasal spray 1 spray per nostril twice a day as needed for nasal congestion.  ?Nasal saline spray (i.e., Simply Saline) or nasal saline lavage (i.e., NeilMed) is recommended as needed and prior to medicated nasal sprays. ?Use cromolyn 4% 1 drop in each eye up to four times a day as needed for itchy/watery eyes.   ?Consider allergy injections for long term control if above medications do not help the symptoms.  ? ?Other atopic dermatitis ?On hands.  ?Continue proper skin care measures.  ?Use Eucrisa (crisaborole) 2% ointment twice a day on mild rash flares on the face and body. This is a non-steroid ointment. Samples given.  ? ?Generalized headache ?Follow up with PCP regarding this if not improving. ? ?Return in about 6 months (around 07/08/2022). ? ?Meds ordered this encounter  ?Medications  ? levocetirizine (XYZAL) 5 MG tablet  ?  Sig: Take 1 tablet (5 mg total) by mouth every evening.  ?  Dispense:  90 tablet  ?  Refill:  3  ? montelukast (SINGULAIR) 10 MG tablet  ?  Sig: Take 1 tablet (10 mg total) by mouth at bedtime.  ?  Dispense:  90 tablet  ?  Refill:  3  ? ?Lab Orders  ?No laboratory test(s) ordered today  ? ? ?Diagnostics: ?None.  ? ?Medication List:  ?Current Outpatient Medications  ?Medication Sig Dispense Refill  ? aspirin (ASPIRIN EC) 81 MG EC tablet Take 1 tablet (81 mg total) by mouth daily. Swallow whole. 30 tablet 12  ? azelastine (ASTELIN) 0.1 % nasal spray Take 1-2 sprays twice a day as needed for runny nose. 30 mL 5  ? blood glucose meter kit and supplies KIT Use a directed twice a day.  E11.8.  Use Relion Meter 1 each 0  ? blood glucose meter kit and supplies KIT Dispense based on patient and insurance preference. Use up to four times daily as directed. (FOR ICD-9 250.00, 250.01). 1 each 0  ? blood glucose meter  kit and supplies KIT Dispense based on patient and insurance preference. Use up to four times daily as directed. (FOR ICD-9 250.00, 250.01). 1 each 0  ? brimonidine (ALPHAGAN) 0.2 % ophthalmic solution   98  ? brimonidine-timolol (COMBIGAN) 0.2-0.5 % ophthalmic solution Place 1 drop into the right eye every 12 (twelve) hours. Reported on 03/16/2016    ? cromolyn (OPTICROM) 4 % ophthalmic solution Place 1 drop into both eyes daily as needed (itchy/watery eyes). 10 mL 3  ? cyclobenzaprine  (FLEXERIL) 10 MG tablet TAKE ONE TABLET BY MOUTH EVERY NIGHT AT BEDTIME 30 tablet 0  ? diclofenac (VOLTAREN) 75 MG EC tablet Take 1 tablet (75 mg total) by mouth 2 (two) times daily. 20 tablet 0  ? EUCRISA 2 % OINT Apply twice a day on the hands 60 g 5  ? ezetimibe (ZETIA) 10 MG tablet TAKE ONE TABLET BY MOUTH DAILY 90 tablet 1  ? fluticasone (FLONASE) 50 MCG/ACT nasal spray Take 1-2 sprays daily for nasal congestion. 16 g 5  ? hypromellose (GENTEAL SEVERE) 0.3 % GEL ophthalmic ointment Place 1 application into both eyes.    ? insulin glargine-yfgn (SEMGLEE, YFGN,) 100 UNIT/ML Pen Inject 10 Units into the skin every morning. And pen needles 1/day 15 mL 3  ? Insulin Pen Needle (PEN NEEDLES) 32G X 4 MM MISC Use As Directed 100 each 5  ? Lancets (ONETOUCH DELICA PLUS [id number]) MISC USE TO CHECK BLOOD SUGAR UP TO FOUR TIMES A DAY 100 each 0  ? latanoprost (XALATAN) 0.005 % ophthalmic solution     ? Multiple Vitamins-Minerals (ICAPS) TABS Take by mouth.    ? NONFORMULARY OR COMPOUNDED ITEM Alcohol pads  #1 box  Use as directed 1 each 5  ? nortriptyline (PAMELOR) 10 MG capsule TAKE ONE CAPSULE BY MOUTH EVERY NIGHT AT BEDTIME 30 capsule 1  ? ONETOUCH ULTRA test strip USE TO CHECK BLOOD GLUCOSE UP TO 4 TIMES A DAY AS DIRECTED 100 strip 3  ? pimecrolimus (ELIDEL) 1 % cream Apply topically 2 (two) times daily as needed (rash on hands.). 30 g 2  ? rosuvastatin (CRESTOR) 40 MG tablet Take 1 tablet (40 mg total) by mouth daily. 30 tablet 0  ? Semaglutide (RYBELSUS) 14 MG TABS Take 1 tablet (14 mg total) by mouth daily. 90 tablet 3  ? valACYclovir (VALTREX) 1000 MG tablet Take 1,000 mg by mouth daily.    ? famotidine (PEPCID) 20 MG tablet TAKE ONE TABLET BY MOUTH TWICE A DAY (Patient not taking: Reported on 01/05/2022) 60 tablet 0  ? levocetirizine (XYZAL) 5 MG tablet Take 1 tablet (5 mg total) by mouth every evening. 90 tablet 3  ? montelukast (SINGULAIR) 10 MG tablet Take 1 tablet (10 mg total) by mouth at bedtime. 90 tablet 3   ? ?No current facility-administered medications for this visit.  ? ?Allergies: ?No Known Allergies ?I reviewed her past medical history, social history, family history, and environmental history and no significant changes have been reported from her previous visit. ? ?Review of Systems  ?Constitutional:  Negative for appetite change, chills, fever and unexpected weight change.  ?HENT:  Negative for congestion, rhinorrhea and sneezing.   ?Eyes:  Positive for redness and itching.  ?Respiratory:  Negative for cough, chest tightness, shortness of breath and wheezing.   ?Cardiovascular:  Negative for chest pain.  ?Gastrointestinal:  Negative for abdominal pain.  ?Genitourinary:  Negative for difficulty urinating.  ?Skin:  Positive for rash.  ?Allergic/Immunologic: Positive for environmental allergies. Negative  for food allergies.  ?Neurological:  Positive for headaches.  ? ?Objective: ?BP 120/80   Pulse 85   Temp 98.2 ?F (36.8 ?C)   Resp 18   Ht $R'5\' 7"'Zd$  (1.702 m)   Wt 145 lb (65.8 kg)   LMP  (LMP Unknown) Comment: patient could not remember last period 05/15/17  SpO2 98%   BMI 22.71 kg/m?  ?Body mass index is 22.71 kg/m?Marland Kitchen ?Physical Exam ?Vitals and nursing note reviewed.  ?Constitutional:   ?   Appearance: Normal appearance. She is well-developed.  ?HENT:  ?   Head: Normocephalic and atraumatic.  ?   Right Ear: Tympanic membrane and external ear normal.  ?   Left Ear: Tympanic membrane and external ear normal.  ?   Nose: Nose normal.  ?   Mouth/Throat:  ?   Mouth: Mucous membranes are moist.  ?   Pharynx: Oropharynx is clear.  ?Eyes:  ?   Conjunctiva/sclera: Conjunctivae normal.  ?Cardiovascular:  ?   Rate and Rhythm: Normal rate and regular rhythm.  ?   Heart sounds: Normal heart sounds. No murmur heard. ?Pulmonary:  ?   Effort: Pulmonary effort is normal.  ?   Breath sounds: Normal breath sounds. No wheezing, rhonchi or rales.  ?Musculoskeletal:  ?   Cervical back: Neck supple.  ?Skin: ?   General: Skin is warm.   ?   Findings: No rash.  ?Neurological:  ?   Mental Status: She is alert and oriented to person, place, and time.  ?Psychiatric:     ?   Behavior: Behavior normal.  ?Previous notes and tests were reviewed. ?The plan was review

## 2022-01-05 ENCOUNTER — Encounter: Payer: Self-pay | Admitting: Allergy

## 2022-01-05 ENCOUNTER — Other Ambulatory Visit: Payer: Self-pay

## 2022-01-05 ENCOUNTER — Ambulatory Visit (INDEPENDENT_AMBULATORY_CARE_PROVIDER_SITE_OTHER): Payer: 59 | Admitting: Allergy

## 2022-01-05 VITALS — BP 120/80 | HR 85 | Temp 98.2°F | Resp 18 | Ht 67.0 in | Wt 145.0 lb

## 2022-01-05 DIAGNOSIS — L2089 Other atopic dermatitis: Secondary | ICD-10-CM

## 2022-01-05 DIAGNOSIS — H1010 Acute atopic conjunctivitis, unspecified eye: Secondary | ICD-10-CM

## 2022-01-05 DIAGNOSIS — J302 Other seasonal allergic rhinitis: Secondary | ICD-10-CM | POA: Diagnosis not present

## 2022-01-05 DIAGNOSIS — R519 Headache, unspecified: Secondary | ICD-10-CM | POA: Diagnosis not present

## 2022-01-05 DIAGNOSIS — H1013 Acute atopic conjunctivitis, bilateral: Secondary | ICD-10-CM | POA: Diagnosis not present

## 2022-01-05 MED ORDER — MONTELUKAST SODIUM 10 MG PO TABS: 10.0000 mg | ORAL_TABLET | Freq: Every day | ORAL | 3 refills | Status: DC

## 2022-01-05 MED ORDER — LEVOCETIRIZINE DIHYDROCHLORIDE 5 MG PO TABS: 5.0000 mg | ORAL_TABLET | Freq: Every evening | ORAL | 3 refills | Status: DC

## 2022-01-05 NOTE — Assessment & Plan Note (Signed)
On hands.  ?? Continue proper skin care measures.  ?? Use Eucrisa (crisaborole) 2% ointment twice a day on mild rash flares on the face and body. This is a non-steroid ointment. Samples given.  ?

## 2022-01-05 NOTE — Assessment & Plan Note (Addendum)
Past history - perennial rhinoconjunctivitis symptoms for the past 2 years. Does not recall previous ENT work-up. 2020 skin testing showed: Positive to grass pollen, weed, ragweed, dust mites and cat.  Discussed AIT with interpreter services.  ?Interim history - stable with below regimen. Not interested in AIT.  ?? Continue environmental control measures. ?? May take levoceterizine '5mg'$  daily for allergies.  ?? Continue Singulair '10mg'$  daily at night. ?? Use azelastine nasal spray 1-2 sprays per nostril twice a day as needed for runny nose/drainage. ?? Use Flonase (fluticasone) nasal spray 1 spray per nostril twice a day as needed for nasal congestion.  ?? Nasal saline spray (i.e., Simply Saline) or nasal saline lavage (i.e., NeilMed) is recommended as needed and prior to medicated nasal sprays. ?? Use cromolyn 4% 1 drop in each eye up to four times a day as needed for itchy/watery eyes.  ?? Consider allergy injections for long term control if above medications do not help the symptoms.  ?

## 2022-01-05 NOTE — Patient Instructions (Addendum)
Seasonal and perennial allergic rhinoconjunctivitis ?2020 skin testing showed: Positive to grass pollen, weed, ragweed, dust mites and cat.   ?Continue environmental control measures. ?May take levoceterizine '5mg'$  daily for allergies.  ?Continue Singulair '10mg'$  daily at night. ?Use azelastine nasal spray 1-2 sprays per nostril twice a day as needed for runny nose/drainage. ?Use Flonase (fluticasone) nasal spray 1 spray per nostril twice a day as needed for nasal congestion.  ?Nasal saline spray (i.e., Simply Saline) or nasal saline lavage (i.e., NeilMed) is recommended as needed and prior to medicated nasal sprays. ?Use cromolyn 4% 1 drop in each eye up to four times a day as needed for itchy/watery eyes.  ?Consider allergy injections for long term control if above medications do not help the symptoms.  ?  ?Other atopic dermatitis ?Continue proper skin care measures.  ?Use Eucrisa (crisaborole) 2% ointment twice a day on mild rash flares on the face and body. This is a non-steroid ointment. Samples given.  ?If it burns, place the medication in the refrigerator.  ?Apply a thin layer of moisturizer and then apply the Eucrisa on top of it. ? ?Follow up in 6 months or sooner if needed.  ? ?Skin care recommendations ? ?Bath time: ?Always use lukewarm water. AVOID very hot or cold water. ?Keep bathing time to 5-10 minutes. ?Do NOT use bubble bath. ?Use a mild soap and use just enough to wash the dirty areas. ?Do NOT scrub skin vigorously.  ?After bathing, pat dry your skin with a towel. Do NOT rub or scrub the skin. ? ?Moisturizers and prescriptions:  ?ALWAYS apply moisturizers immediately after bathing (within 3 minutes). This helps to lock-in moisture. ?Use the moisturizer several times a day over the whole body. ?Good summer moisturizers include: Aveeno, CeraVe, Cetaphil. ?Good winter moisturizers include: Aquaphor, Vaseline, Cerave, Cetaphil, Eucerin, Vanicream. ?When using moisturizers along with medications, the  moisturizer should be applied about one hour after applying the medication to prevent diluting effect of the medication or moisturize around where you applied the medications. When not using medications, the moisturizer can be continued twice daily as maintenance. ? ?Laundry and clothing: ?Avoid laundry products with added color or perfumes. ?Use unscented hypo-allergenic laundry products such as Tide free, Cheer free & gentle, and All free and clear.  ?If the skin still seems dry or sensitive, you can try double-rinsing the clothes. ?Avoid tight or scratchy clothing such as wool. ?Do not use fabric softeners or dyer sheets. ?

## 2022-01-05 NOTE — Assessment & Plan Note (Signed)
?   Follow up with PCP regarding this if not improving. ?

## 2022-01-20 ENCOUNTER — Ambulatory Visit: Payer: 59 | Admitting: Endocrinology

## 2022-01-20 ENCOUNTER — Encounter: Payer: Self-pay | Admitting: Endocrinology

## 2022-01-20 VITALS — BP 220/78 | HR 57 | Ht 67.0 in | Wt 152.0 lb

## 2022-01-20 DIAGNOSIS — E119 Type 2 diabetes mellitus without complications: Secondary | ICD-10-CM

## 2022-01-20 LAB — POCT GLYCOSYLATED HEMOGLOBIN (HGB A1C): Hemoglobin A1C: 8.6 % — AB (ref 4.0–5.6)

## 2022-01-20 MED ORDER — INSULIN GLARGINE-YFGN 100 UNIT/ML ~~LOC~~ SOPN: 15.0000 [IU] | PEN_INJECTOR | SUBCUTANEOUS | 3 refills | Status: DC

## 2022-01-20 MED ORDER — RYBELSUS 7 MG PO TABS: 7.0000 mg | ORAL_TABLET | Freq: Every day | ORAL | 3 refills | Status: DC

## 2022-01-20 NOTE — Progress Notes (Signed)
? ?Subjective:  ? ? Patient ID: Claire Hill, female    DOB: 1971-05-07, 51 y.o.   MRN: [id number] ? ?HPI ?Pt reclines interpreter ?Pt returns for f/u of DM ?DM type: Insulin-requiring type 2 ?Dx'ed: 2012 ?Complications: PN ?Therapy: insulin since 2022, and 3 oral meds.   ?GDM: never ?DKA: never ?Severe hypoglycemia: never.   ?Pancreatitis: never.   ?Other: she declines multiple daily injections.   ?Interval history: no cbg record, but states cbg's vary from 127-400.  Pt does not have med bottles with her today.   pt states she feels better in general.   ?Past Medical History:  ?Diagnosis Date  ? Allergy   ? Anemia   ? Asthma   ? pt states no-no inhalers  ? COPD (chronic obstructive pulmonary disease) ([place])   ? Diabetes mellitus   ? Diabetes mellitus without complication ([place])   ? Glaucoma   ? Herpes   ? Right eye  ? Herpes simplex of eye   ? Hyperlipidemia   ? OCD (obsessive compulsive disorder)   ? ? ?Past Surgical History:  ?Procedure Laterality Date  ? MASS EXCISION  06/22/2012  ? Procedure: EXCISION MASS;  Surgeon: Harl Bowie, MD;  Location: [facility];  Service: General;  Laterality: Right;  Excision chronic Right axillary cyst  ? NO PAST SURGERIES    ? ? ?Social History  ? ?Socioeconomic History  ? Marital status: Married  ?  Spouse name: Jan  ? Number of children: 0  ? Years of education: Not on file  ? Highest education level: Associate degree: academic program  ?Occupational History  ? Occupation: factory production  ?Tobacco Use  ? Smoking status: Never  ? Smokeless tobacco: Never  ?Vaping Use  ? Vaping Use: Never used  ?Substance and Sexual Activity  ? Alcohol use: No  ? Drug use: No  ? Sexual activity: Not Currently  ?  Partners: Male  ?Other Topics Concern  ? Not on file  ?Social History Narrative  ? ** Merged History Encounter **  ?  Exercise-no  ?   ? Patient is right-handed. She lives with her husband in a one level home, 4 steps to enter. She drinks coffee occasionally.  She exercises 4 days a week.  ? ?Social Determinants of Health  ? ?Financial Resource Strain: Not on file  ?Food Insecurity: Not on file  ?Transportation Needs: Not on file  ?Physical Activity: Not on file  ?Stress: Not on file  ?Social Connections: Not on file  ?Intimate Partner Violence: Not on file  ? ? ?Current Outpatient Medications on File Prior to Visit  ?Medication Sig Dispense Refill  ? aspirin (ASPIRIN EC) 81 MG EC tablet Take 1 tablet (81 mg total) by mouth daily. Swallow whole. 30 tablet 12  ? azelastine (ASTELIN) 0.1 % nasal spray Take 1-2 sprays twice a day as needed for runny nose. 30 mL 5  ? blood glucose meter kit and supplies KIT Use a directed twice a day.  E11.8.  Use Relion Meter 1 each 0  ? blood glucose meter kit and supplies KIT Dispense based on patient and insurance preference. Use up to four times daily as directed. (FOR ICD-9 250.00, 250.01). 1 each 0  ? blood glucose meter kit and supplies KIT Dispense based on patient and insurance preference. Use up to four times daily as directed. (FOR ICD-9 250.00, 250.01). 1 each 0  ? brimonidine (ALPHAGAN) 0.2 % ophthalmic solution   98  ? brimonidine-timolol (  COMBIGAN) 0.2-0.5 % ophthalmic solution Place 1 drop into the right eye every 12 (twelve) hours. Reported on 03/16/2016    ? cromolyn (OPTICROM) 4 % ophthalmic solution Place 1 drop into both eyes daily as needed (itchy/watery eyes). 10 mL 3  ? cyclobenzaprine (FLEXERIL) 10 MG tablet TAKE ONE TABLET BY MOUTH EVERY NIGHT AT BEDTIME 30 tablet 0  ? diclofenac (VOLTAREN) 75 MG EC tablet Take 1 tablet (75 mg total) by mouth 2 (two) times daily. 20 tablet 0  ? EUCRISA 2 % OINT Apply twice a day on the hands 60 g 5  ? ezetimibe (ZETIA) 10 MG tablet TAKE ONE TABLET BY MOUTH DAILY 90 tablet 1  ? famotidine (PEPCID) 20 MG tablet TAKE ONE TABLET BY MOUTH TWICE A DAY 60 tablet 0  ? fluticasone (FLONASE) 50 MCG/ACT nasal spray Take 1-2 sprays daily for nasal congestion. 16 g 5  ? hypromellose (GENTEAL  SEVERE) 0.3 % GEL ophthalmic ointment Place 1 application into both eyes.    ? Insulin Pen Needle (PEN NEEDLES) 32G X 4 MM MISC Use As Directed 100 each 5  ? Lancets (ONETOUCH DELICA PLUS [id number]) MISC USE TO CHECK BLOOD SUGAR UP TO FOUR TIMES A DAY 100 each 0  ? latanoprost (XALATAN) 0.005 % ophthalmic solution     ? levocetirizine (XYZAL) 5 MG tablet Take 1 tablet (5 mg total) by mouth every evening. 90 tablet 3  ? montelukast (SINGULAIR) 10 MG tablet Take 1 tablet (10 mg total) by mouth at bedtime. 90 tablet 3  ? Multiple Vitamins-Minerals (ICAPS) TABS Take by mouth.    ? NONFORMULARY OR COMPOUNDED ITEM Alcohol pads  #1 box  Use as directed 1 each 5  ? nortriptyline (PAMELOR) 10 MG capsule TAKE ONE CAPSULE BY MOUTH EVERY NIGHT AT BEDTIME 30 capsule 1  ? ONETOUCH ULTRA test strip USE TO CHECK BLOOD GLUCOSE UP TO 4 TIMES A DAY AS DIRECTED 100 strip 3  ? pimecrolimus (ELIDEL) 1 % cream Apply topically 2 (two) times daily as needed (rash on hands.). 30 g 2  ? rosuvastatin (CRESTOR) 40 MG tablet Take 1 tablet (40 mg total) by mouth daily. 30 tablet 0  ? valACYclovir (VALTREX) 1000 MG tablet Take 1,000 mg by mouth daily.    ? ?No current facility-administered medications on file prior to visit.  ? ? ?No Known Allergies ? ?Family History  ?Problem Relation Age of Onset  ? Diabetes Father   ? Hypertension Mother   ? Diabetes Paternal Grandmother   ? Cancer Other   ?     LUNG, BRAIN AND STOMACH   ? Cancer Cousin   ?     ovarian  ? ? ?BP (!) 220/78 (BP Location: Left Arm, Patient Position: Sitting, Cuff Size: Normal)   Pulse (!) 57   Ht _0  (1.702 m)   Wt 152 lb (68.9 kg)   LMP  (LMP Unknown) Comment: patient could not remember last period 05/15/17  SpO2 99%   BMI 23.81 kg/m?  ? ? ?Review of Systems ?She denies hypoglycemia, but she has nausea.   ?   ?Objective:  ? Physical Exam ? ? ? ?Lab Results  ?Component Value Date  ? HGBA1C 8.6 (A) 01/20/2022  ? ?   ?Assessment & Plan:  ?Insulin-requiring type 2 DM:  uncontrolled ?Nausea, due to Rybelsus. ? ?Patient Instructions  ?I have sent a prescription to your pharmacy, to half the Rybelsus.   ?Please increase the Semglee to 15 units each morning.  ?  check your blood sugar once a day.  vary the time of day when you check, between before the 3 meals, and at bedtime.  also check if you have symptoms of your blood sugar being too high or too low.  please keep a record of the readings and bring it to your next appointment here (or you can bring the meter itself).  You can write it on any piece of paper.  please call us sooner if your blood sugar goes below 70, or if you have a lot of readings over 200.   ?Please come back for a follow-up appointment in 3 months.  ? ?He enviado una receta a su farmacia, a la mitad de Rybelsus.  ?Aumente el Semglee a 15 unidades cada ma?ana. ?controle su nivel de az?car en la sangre una vez al d?a. [name] la hora del d?a en la que realiza la [place], entre antes de las 3 comidas y antes de [place]. tambi?n verifique si tiene s?ntomas de que su nivel de az?car en la sangre es demasiado alto o [place]. mantenga un registro de las lecturas y tr?igalo a su pr?xima cita aqu? (o puede traer [employer]). Puedes escribirlo en cualquier hoja de papel. ll?menos antes si su nivel de az?car en la sangre es inferior a 49 o si tiene muchas lecturas superiores a 200. ?Vuelva para una cita de seguimiento en 3 meses.  ? ? ? ? ? ? ? ? ?

## 2022-01-20 NOTE — Patient Instructions (Addendum)
I have sent a prescription to your pharmacy, to half the Rybelsus.   ?Please increase the Semglee to 15 units each morning.  ?check your blood sugar once a day.  vary the time of day when you check, between before the 3 meals, and at bedtime.  also check if you have symptoms of your blood sugar being too high or too low.  please keep a record of the readings and bring it to your next appointment here (or you can bring the meter itself).  You can write it on any piece of paper.  please call us sooner if your blood sugar goes below 70, or if you have a lot of readings over 200.   ?Please come back for a follow-up appointment in 3 months.  ? ?He enviado una receta a su farmacia, a la mitad de Rybelsus.  ?Aumente el Semglee a 15 unidades cada ma?ana. ?controle su nivel de az?car en la sangre una vez al d?a. [name] la hora del d?a en la que realiza la [place], entre antes de las 3 comidas y antes de [place]. tambi?n verifique si tiene s?ntomas de que su nivel de az?car en la sangre es demasiado alto o [place]. mantenga un registro de las lecturas y tr?igalo a su pr?xima cita aqu? (o puede traer [employer]). Puedes escribirlo en cualquier hoja de papel. ll?menos antes si su nivel de az?car en la sangre es inferior a 28 o si tiene muchas lecturas superiores a 200. ?Vuelva para una cita de seguimiento en 3 meses.  ? ? ? ? ? ? ?

## 2022-02-22 ENCOUNTER — Other Ambulatory Visit: Payer: Self-pay | Admitting: Family Medicine

## 2022-04-07 ENCOUNTER — Ambulatory Visit (INDEPENDENT_AMBULATORY_CARE_PROVIDER_SITE_OTHER): Payer: 59 | Admitting: Family Medicine

## 2022-04-07 VITALS — BP 118/70 | HR 75 | Temp 98.3°F | Resp 18 | Ht 67.0 in | Wt 154.6 lb

## 2022-04-07 DIAGNOSIS — H109 Unspecified conjunctivitis: Secondary | ICD-10-CM | POA: Diagnosis not present

## 2022-04-07 DIAGNOSIS — J0140 Acute pansinusitis, unspecified: Secondary | ICD-10-CM

## 2022-04-07 MED ORDER — AMOXICILLIN-POT CLAVULANATE 875-125 MG PO TABS: 1.0000 | ORAL_TABLET | Freq: Two times a day (BID) | ORAL | 0 refills | Status: DC

## 2022-04-07 MED ORDER — MOXIFLOXACIN HCL 0.5 % OP SOLN: 1.0000 [drp] | Freq: Three times a day (TID) | OPHTHALMIC | 0 refills | Status: DC

## 2022-04-07 NOTE — Patient Instructions (Signed)
Conjuntivitis bacteriana en los adultos Bacterial Conjunctivitis, Adult La conjuntivitis bacteriana es una infeccin de la conjuntiva. Es la membrana transparente que recubre la parte blanca del ojo y la parte interna del prpado. Esta infeccin puede causar lo siguiente en los ojos: Color rojo o rosado. Picazn o irritacin. Esta afeccin se transmite fcilmente de [place] persona a otra (es contagiosa) y de un ojo al otro. Cules son las causas? La causa de esta afeccin son los grmenes (bacterias). Puede contraer la infeccin si entra en contacto estrecho con: Una persona que tiene la infeccin. Elementos que tienen grmenes (estn contaminados), como toallas para la cara, solucin para lentes de contacto o maquillaje para ojos. Qu incrementa el riesgo? Es ms probable que tenga esta afeccin si: Tiene contacto con personas que tienen la infeccin. [place] lentes de contacto. Tiene sinusitis. Ha tenido una lesin o [place] reciente en el ojo. Tiene debilitado el sistema de defensa del organismo (sistema inmunitario). Tiene los ojos secos. Cules son los signos o sntomas?  Secrecin espesa y Designer, fashion/clothing del ojo. Lagrimeo u ojos llorosos. Picazn en los ojos. Sensacin de ardor en los ojos. Ojos rojos. Hinchazn de los prpados. Visin borrosa. Cmo se trata?  Gotas para los ojos o ungento con antibitico. Antibiticos que se toman por va oral. Estos se utilizan para infecciones que no mejoran con gotas o ungentos, o que duran ms de 10 das. Paos hmedos fros [employer]. Lgrimas artificiales aplicadas de 2 a 6 veces por da. Siga estas instrucciones en su casa: Medicamentos [place] o aplique el antibitico como se lo haya indicado el mdico. No deje de usarlo aunque comience a sentirse mejor. Tome o [employer] medicamentos de venta libre y los recetados solamente como se lo haya indicado el mdico. No toque el prpado con el frasco de gotas para los ojos o el tubo de  [place]. Control de las molestias Lmpiese el lquido del ojo con un pao hmedo y tibio o con una torunda de algodn. Colquese un pao limpio, hmedo y fro [place]. Haga esto durante 10 a 20 minutos, de 3 a 4 veces al da. Instrucciones generales No use lentes de contacto hasta que la infeccin haya desaparecido. Use anteojos hasta que el mdico lo autorice a ponerse lentes de contacto nuevamente. No use maquillaje en los ojos hasta que la infeccin haya desaparecido. Deseche el maquillaje para ojos viejo. Cambie o lave su almohada todos los [place]. No comparta las toallas o los paos. Lvese las manos con agua y jabn con frecuencia durante al menos 68 segundos y especialmente antes de tocarse la cara o los ojos. Use toallas de papel para secarse las manos. No se toque ni se frote los ojos. No conduzca ni use maquinaria pesada si su visin es borrosa. Comunquese con un mdico si: Tiene fiebre. No mejora luego de 10 das. Solicite ayuda de inmediato si: Tiene fiebre y los sntomas empeoran de Aristes sbita. Siente dolor muy intenso cuando mueve el ojo. El rostro: Le duele. Est enrojecido. Est hinchado. Pierde la visin repentinamente. Resumen La conjuntivitis bacteriana es una infeccin de la conjuntiva. Esta infeccin se contagia fcilmente de Ardelia Mems persona a [place]. Lvese las manos con agua y jabn con frecuencia durante al menos 71 segundos y especialmente antes de tocarse la cara o los ojos. Use toallas de papel para secarse las manos. Tome o aplique el antibitico como se lo haya indicado el mdico. Comunquese con un mdico si tiene fiebre o no mejora despus de  10 das. Esta informacin no tiene Marine scientist el consejo del mdico. Asegrese de hacerle al mdico cualquier pregunta que tenga. Document Revised: 02/11/2021 Document Reviewed: 02/11/2021 Elsevier Patient Education  [place].

## 2022-04-07 NOTE — Progress Notes (Signed)
Subjective:   By signing my name below, I, Shehryar Baig, attest that this documentation has been prepared under the direction and in the presence of Donato Schultz, DO  04/07/2022    Patient ID: Claire Hill, female    DOB: [birth year], 51 y.o.   MRN: [id number]  Chief Complaint  Patient presents with   Rash    X2 weeks, pt having swelling itching, redness, and swelling on the face.     Rash   Patient is in today for a office visit. An in office interpretor was used during this visit.   She complains of rash, swelling, itching, redness around her eyes. She also has painful rash with no itching on her cheeks. Her symptoms are worse on the left eyes. She also has an occasional sharp pain in her left eye causing her to wink. She notes waking up with more build up on her eyes than normal. She also finds more crust build up around her eyes during the day. She continues using Astelin nasal spray regularly.  She has not had her eyes checked in the past year.    Past Medical History:  Diagnosis Date   Allergy    Anemia    Asthma    pt states no-no inhalers   COPD (chronic obstructive pulmonary disease) (HCC)    Diabetes mellitus    Diabetes mellitus without complication (HCC)    Glaucoma    Herpes    Right eye   Herpes simplex of eye    Hyperlipidemia    OCD (obsessive compulsive disorder)     Past Surgical History:  Procedure Laterality Date   MASS EXCISION  06/22/2012   Procedure: EXCISION MASS;  Surgeon: Shelly Rubenstein, MD;  Location: [facility];  Service: General;  Laterality: Right;  Excision chronic Right axillary cyst   NO PAST SURGERIES      Family History  Problem Relation Age of Onset   Diabetes Father    Hypertension Mother    Diabetes Paternal Grandmother    Cancer Other        LUNG, BRAIN AND STOMACH    Cancer Cousin        ovarian    Social History   Socioeconomic History   Marital status: Married    Spouse name:  Jan   Number of children: 0   Years of education: Not on file   Highest education level: Associate degree: academic program  Occupational History   Occupation: Database administrator  Tobacco Use   Smoking status: Never   Smokeless tobacco: Never  Building services engineer Use: Never used  Substance and Sexual Activity   Alcohol use: No   Drug use: No   Sexual activity: Not Currently    Partners: Male  Other Topics Concern   Not on file  Social History Narrative   ** Merged History Encounter **    Exercise-no      Patient is right-handed. She lives with her husband in a one level home, 4 steps to enter. She drinks coffee occasionally. She exercises 4 days a week.   Social Determinants of Health   Financial Resource Strain: Not on file  Food Insecurity: Not on file  Transportation Needs: Not on file  Physical Activity: Not on file  Stress: Not on file  Social Connections: Not on file  Intimate Partner Violence: Not on file    Outpatient Medications Prior to Visit  Medication Sig Dispense Refill  aspirin (ASPIRIN EC) 81 MG EC tablet Take 1 tablet (81 mg total) by mouth daily. Swallow whole. 30 tablet 12   azelastine (ASTELIN) 0.1 % nasal spray Take 1-2 sprays twice a day as needed for runny nose. 30 mL 5   blood glucose meter kit and supplies KIT Use a directed twice a day.  E11.8.  Use Relion Meter 1 each 0   blood glucose meter kit and supplies KIT Dispense based on patient and insurance preference. Use up to four times daily as directed. (FOR ICD-9 250.00, 250.01). 1 each 0   blood glucose meter kit and supplies KIT Dispense based on patient and insurance preference. Use up to four times daily as directed. (FOR ICD-9 250.00, 250.01). 1 each 0   brimonidine (ALPHAGAN) 0.2 % ophthalmic solution   98   brimonidine-timolol (COMBIGAN) 0.2-0.5 % ophthalmic solution Place 1 drop into the right eye every 12 (twelve) hours. Reported on 03/16/2016     cromolyn (OPTICROM) 4 % ophthalmic  solution Place 1 drop into both eyes daily as needed (itchy/watery eyes). 10 mL 3   cyclobenzaprine (FLEXERIL) 10 MG tablet TAKE ONE TABLET BY MOUTH EVERY NIGHT AT BEDTIME 30 tablet 0   diclofenac (VOLTAREN) 75 MG EC tablet Take 1 tablet (75 mg total) by mouth 2 (two) times daily. 20 tablet 0   EUCRISA 2 % OINT Apply twice a day on the hands 60 g 5   ezetimibe (ZETIA) 10 MG tablet TAKE ONE TABLET BY MOUTH DAILY 90 tablet 1   famotidine (PEPCID) 20 MG tablet TAKE ONE TABLET BY MOUTH TWICE A DAY 60 tablet 0   fluticasone (FLONASE) 50 MCG/ACT nasal spray Take 1-2 sprays daily for nasal congestion. 16 g 5   hypromellose (GENTEAL SEVERE) 0.3 % GEL ophthalmic ointment Place 1 application into both eyes.     insulin glargine-yfgn (SEMGLEE, YFGN,) 100 UNIT/ML Pen Inject 15 Units into the skin every morning. And pen needles 1/day 15 mL 3   Insulin Pen Needle (PEN NEEDLES) 32G X 4 MM MISC Use As Directed 100 each 5   Lancets (ONETOUCH DELICA PLUS [id number]) MISC USE TO CHECK BLOOD SUGAR UP TO FOUR TIMES A DAY 100 each 0   latanoprost (XALATAN) 0.005 % ophthalmic solution      levocetirizine (XYZAL) 5 MG tablet Take 1 tablet (5 mg total) by mouth every evening. 90 tablet 3   montelukast (SINGULAIR) 10 MG tablet Take 1 tablet (10 mg total) by mouth at bedtime. 90 tablet 3   Multiple Vitamins-Minerals (ICAPS) TABS Take by mouth.     NONFORMULARY OR COMPOUNDED ITEM Alcohol pads  #1 box  Use as directed 1 each 5   nortriptyline (PAMELOR) 10 MG capsule TAKE ONE CAPSULE BY MOUTH EVERY NIGHT AT BEDTIME 30 capsule 1   ONETOUCH ULTRA test strip USE TO CHECK BLOOD SUGAR UP TO 4 TIMES A DAY AS DIRECTED 50 strip 1   pimecrolimus (ELIDEL) 1 % cream Apply topically 2 (two) times daily as needed (rash on hands.). 30 g 2   rosuvastatin (CRESTOR) 40 MG tablet Take 1 tablet (40 mg total) by mouth daily. 30 tablet 0   Semaglutide (RYBELSUS) 7 MG TABS Take 7 mg by mouth daily. 90 tablet 3   valACYclovir (VALTREX) 1000 MG  tablet Take 1,000 mg by mouth daily.     No facility-administered medications prior to visit.    No Known Allergies  Review of Systems  Eyes:        (+)occasional  sharp pain in left eye  Skin:  Positive for itching (around eyes) and rash (non-tiching and painful on both cheeks).       (+)swelling around eyes and cheeks (+)redness around eyes and rashes on cheeks       Objective:    Physical Exam Constitutional:      General: She is not in acute distress.    Appearance: Normal appearance. She is not ill-appearing.  HENT:     Head: Normocephalic and atraumatic.     Right Ear: External ear normal.     Left Ear: External ear normal.  Eyes:     Extraocular Movements: Extraocular movements intact.     Pupils: Pupils are equal, round, and reactive to light.  Cardiovascular:     Rate and Rhythm: Normal rate and regular rhythm.     Heart sounds: Normal heart sounds. No murmur heard.    No gallop.  Pulmonary:     Effort: Pulmonary effort is normal. No respiratory distress.     Breath sounds: Normal breath sounds. No wheezing or rales.  Skin:    General: Skin is warm and dry.  Neurological:     Mental Status: She is alert and oriented to person, place, and time.  Psychiatric:        Judgment: Judgment normal.     BP 118/70 (BP Location: Left Arm, Patient Position: Sitting, Cuff Size: Normal)   Pulse 75   Temp 98.3 F (36.8 C) (Oral)   Resp 18   Ht 5\' 7"  (1.702 m)   Wt 154 lb 9.6 oz (70.1 kg)   LMP  (LMP Unknown) Comment: patient could not remember last period 05/15/17  SpO2 98%   BMI 24.21 kg/m  Wt Readings from Last 3 Encounters:  04/07/22 154 lb 9.6 oz (70.1 kg)  01/20/22 152 lb (68.9 kg)  01/05/22 145 lb (65.8 kg)    Diabetic Foot Exam - Simple   No data filed    Lab Results  Component Value Date   WBC 4.1 03/10/2021   HGB 12.6 03/10/2021   HCT 37.7 03/10/2021   PLT 220.0 03/10/2021   GLUCOSE 151 (H) 03/10/2021   CHOL 243 (H) 02/11/2020   TRIG 207.0  (H) 02/11/2020   HDL 84.80 02/11/2020   LDLDIRECT 106.0 02/11/2020   LDLCALC 161 (H) 05/17/2019   ALT 23 03/10/2021   AST 21 03/10/2021   NA 141 03/10/2021   K 4.2 03/10/2021   CL 105 03/10/2021   CREATININE 0.84 03/10/2021   BUN 18 03/10/2021   CO2 30 03/10/2021   TSH 0.89 03/10/2021   HGBA1C 8.6 (A) 01/20/2022   MICROALBUR 0.7 02/11/2020    Lab Results  Component Value Date   TSH 0.89 03/10/2021   Lab Results  Component Value Date   WBC 4.1 03/10/2021   HGB 12.6 03/10/2021   HCT 37.7 03/10/2021   MCV 90.0 03/10/2021   PLT 220.0 03/10/2021   Lab Results  Component Value Date   NA 141 03/10/2021   K 4.2 03/10/2021   CO2 30 03/10/2021   GLUCOSE 151 (H) 03/10/2021   BUN 18 03/10/2021   CREATININE 0.84 03/10/2021   BILITOT 0.4 03/10/2021   ALKPHOS 100 03/10/2021   AST 21 03/10/2021   ALT 23 03/10/2021   PROT 6.6 03/10/2021   ALBUMIN 4.3 03/10/2021   CALCIUM 9.7 03/10/2021   GFR 81.37 03/10/2021   Lab Results  Component Value Date   CHOL 243 (H) 02/11/2020   Lab Results  Component Value Date   HDL 84.80 02/11/2020   Lab Results  Component Value Date   LDLCALC 161 (H) 05/17/2019   Lab Results  Component Value Date   TRIG 207.0 (H) 02/11/2020   Lab Results  Component Value Date   CHOLHDL 3 02/11/2020   Lab Results  Component Value Date   HGBA1C 8.6 (A) 01/20/2022       Assessment & Plan:   Problem List Items Addressed This Visit   None    No orders of the defined types were placed in this encounter.   I, Shehryar [place], personally preformed the services described in this documentation.  All medical record entries made by the scribe were at my direction and in my presence.  I have reviewed the chart and discharge instructions (if applicable) and agree that the record reflects my personal performance and is accurate and complete. 04/07/2022   I,Shehryar Baig,acting as a Education administrator for [employer], DO.,have documented all relevant  documentation on the behalf of Ann Held, DO,as directed by  Ann Held, DO while in the presence of Ann Held, DO.   Shehryar [employer]

## 2022-04-13 ENCOUNTER — Telehealth: Payer: Self-pay | Admitting: Family Medicine

## 2022-04-13 ENCOUNTER — Encounter: Payer: Self-pay | Admitting: Family Medicine

## 2022-04-13 DIAGNOSIS — J0140 Acute pansinusitis, unspecified: Secondary | ICD-10-CM | POA: Insufficient documentation

## 2022-04-13 DIAGNOSIS — H109 Unspecified conjunctivitis: Secondary | ICD-10-CM | POA: Insufficient documentation

## 2022-04-13 NOTE — Telephone Encounter (Signed)
Patient would like to let Dr. Etter Sjogren know that besides a headache she is doing much better and will let her know if something goes wrong.

## 2022-04-13 NOTE — Assessment & Plan Note (Signed)
abx per orders  flonase and antihistmine rto prn

## 2022-04-13 NOTE — Assessment & Plan Note (Signed)
vigamox gtts rto prn  Cool compresses

## 2022-04-19 ENCOUNTER — Ambulatory Visit: Payer: 59 | Admitting: Internal Medicine

## 2022-04-19 NOTE — Progress Notes (Deleted)
Name: [name]  Age/ Sex: 51 y.o., female   MRN/ DOB: [id number], 1971/04/13     PCP: Ann Held, DO   Reason for Endocrinology Evaluation: Type 2 Diabetes Mellitus  Initial Endocrine Consultative Visit: 04/12/2013    PATIENT IDENTIFIER: Ms. FRANCESSCA FRIIS is a 51 y.o. female with a past medical history of T2DM, Dyslipidemia. The patient has followed with Endocrinology clinic since 04/12/2013 for consultative assistance with management of her diabetes.  DIABETIC HISTORY:  Ms. Kishi was diagnosed with DM ***, and started insulin therapy approximately *** years after diagnosis. ***. Her hemoglobin A1c has ranged from 5.8% in 2022, peaking at 10.7% in 2023.   SUBJECTIVE:   During the last visit (01/20/2022): Saw Dr. Loanne Drilling   Today (04/19/2022): Ms. Falkenstein is here for a follow up on diabetes management.  She checks her blood sugars *** times daily, preprandial to breakfast and ***. The patient has *** had hypoglycemic episodes since the last clinic visit, which typically occur *** x / - most often occuring ***. The patient is *** symptomatic with these episodes, with symptoms of {symptoms; hypoglycemia:[id number]}.    HOME DIABETES REGIMEN:  Semglee  Rybelsus 7 mg daily   Statin: yes ACE-I/ARB: no    METER DOWNLOAD SUMMARY: Date range evaluated: *** Fingerstick Blood Glucose Tests = *** Average Number Tests/Day = *** Overall Mean FS Glucose = *** Standard Deviation = ***  BG Ranges: Low = *** High = ***   Hypoglycemic Events/30 Days: BG < 50 = *** Episodes of symptomatic severe hypoglycemia = ***    DIABETIC COMPLICATIONS: Microvascular complications:  *** Denies: CKD Last Eye Exam: Completed   Macrovascular complications:   Denies: CAD, CVA, PVD   HISTORY:  Past Medical History:  Past Medical History:  Diagnosis Date   Allergy    Anemia    Asthma    pt states no-no inhalers   COPD (chronic obstructive pulmonary disease)  (HCC)    Diabetes mellitus    Diabetes mellitus without complication (HCC)    Glaucoma    Herpes    Right eye   Herpes simplex of eye    Hyperlipidemia    OCD (obsessive compulsive disorder)    Past Surgical History:  Past Surgical History:  Procedure Laterality Date   MASS EXCISION  06/22/2012   Procedure: EXCISION MASS;  Surgeon: Harl Bowie, MD;  Location: [facility];  Service: General;  Laterality: Right;  Excision chronic Right axillary cyst   NO PAST SURGERIES     Social History:  reports that she has never smoked. She has never used smokeless tobacco. She reports that she does not drink alcohol and does not use drugs. Family History:  Family History  Problem Relation Age of Onset   Diabetes Father    Hypertension Mother    Diabetes Paternal Grandmother    Cancer Other        LUNG, BRAIN AND STOMACH    Cancer Cousin        ovarian     HOME MEDICATIONS: Allergies as of 04/19/2022   No Known Allergies      Medication List        Accurate as of April 19, 2022  1:07 PM. If you have any questions, ask your nurse or doctor.          amoxicillin-clavulanate 875-125 MG tablet Commonly known as: AUGMENTIN Take 1 tablet by mouth 2 (two) times daily.   aspirin EC 81 MG  tablet Commonly known as: aspirin EC Take 1 tablet (81 mg total) by mouth daily. Swallow whole.   azelastine 0.1 % nasal spray Commonly known as: ASTELIN Take 1-2 sprays twice a day as needed for runny nose.   blood glucose meter kit and supplies Kit Use a directed twice a day.  E11.8.  Use Relion Meter   blood glucose meter kit and supplies Kit Dispense based on patient and insurance preference. Use up to four times daily as directed. (FOR ICD-9 250.00, 250.01).   blood glucose meter kit and supplies Kit Dispense based on patient and insurance preference. Use up to four times daily as directed. (FOR ICD-9 250.00, 250.01).   brimonidine 0.2 % ophthalmic  solution Commonly known as: ALPHAGAN   brimonidine-timolol 0.2-0.5 % ophthalmic solution Commonly known as: COMBIGAN Place 1 drop into the right eye every 12 (twelve) hours. Reported on 03/16/2016   cromolyn 4 % ophthalmic solution Commonly known as: OPTICROM Place 1 drop into both eyes daily as needed (itchy/watery eyes).   cyclobenzaprine 10 MG tablet Commonly known as: FLEXERIL TAKE ONE TABLET BY MOUTH EVERY NIGHT AT BEDTIME   diclofenac 75 MG EC tablet Commonly known as: VOLTAREN Take 1 tablet (75 mg total) by mouth 2 (two) times daily.   Eucrisa 2 % Oint Generic drug: Crisaborole Apply twice a day on the hands   ezetimibe 10 MG tablet Commonly known as: ZETIA TAKE ONE TABLET BY MOUTH DAILY   famotidine 20 MG tablet Commonly known as: PEPCID TAKE ONE TABLET BY MOUTH TWICE A DAY   fluticasone 50 MCG/ACT nasal spray Commonly known as: FLONASE Take 1-2 sprays daily for nasal congestion.   GenTeal Severe 0.3 % Gel ophthalmic ointment Generic drug: hypromellose Place 1 application into both eyes.   ICAPS Tabs Take by mouth.   insulin glargine-yfgn 100 UNIT/ML Pen Commonly known as: Semglee (yfgn) Inject 15 Units into the skin every morning. And pen needles 1/day   latanoprost 0.005 % ophthalmic solution Commonly known as: XALATAN   levocetirizine 5 MG tablet Commonly known as: Xyzal Take 1 tablet (5 mg total) by mouth every evening.   montelukast 10 MG tablet Commonly known as: Singulair Take 1 tablet (10 mg total) by mouth at bedtime.   moxifloxacin 0.5 % ophthalmic solution Commonly known as: Vigamox Place 1 drop into both eyes 3 (three) times daily.   NONFORMULARY OR COMPOUNDED ITEM Alcohol pads  #1 box  Use as directed   nortriptyline 10 MG capsule Commonly known as: PAMELOR TAKE ONE CAPSULE BY MOUTH EVERY NIGHT AT BEDTIME   OneTouch Delica Plus [id number] Misc USE TO CHECK BLOOD SUGAR UP TO FOUR TIMES A DAY   OneTouch Ultra test strip Generic  drug: glucose blood USE TO CHECK BLOOD SUGAR UP TO 4 TIMES A DAY AS DIRECTED   Pen Needles 32G X 4 MM Misc Use As Directed   pimecrolimus 1 % cream Commonly known as: ELIDEL Apply topically 2 (two) times daily as needed (rash on hands.).   rosuvastatin 40 MG tablet Commonly known as: CRESTOR Take 1 tablet (40 mg total) by mouth daily.   Rybelsus 7 MG Tabs Generic drug: Semaglutide Take 7 mg by mouth daily.   valACYclovir 1000 MG tablet Commonly known as: VALTREX Take 1,000 mg by mouth daily.         OBJECTIVE:   Vital Signs: LMP  (LMP Unknown) Comment: patient could not remember last period 05/15/17  Wt Readings from Last 3 Encounters:  04/07/22 154  lb 9.6 oz (70.1 kg)  01/20/22 152 lb (68.9 kg)  01/05/22 145 lb (65.8 kg)     Exam: General: Pt appears well and is in NAD  Hydration: Well-hydrated with moist mucous membranes and good skin turgor  HEENT: Head: Unremarkable with good dentition. Oropharynx clear without exudate.  Eyes: External eye exam normal without stare, lid lag or exophthalmos.  EOM intact.  PERRL.  Neck: General: Supple without adenopathy. Thyroid: Thyroid size normal.  No goiter or nodules appreciated. No thyroid bruit.  Lungs: Clear with good BS bilat with no rales, rhonchi, or wheezes  Heart: RRR with normal S1 and S2 and no gallops; no murmurs; no rub  Abdomen: Normoactive bowel sounds, soft, nontender, without masses or organomegaly palpable  Extremities: No pretibial edema. No tremor. Normal strength and motion throughout. See detailed diabetic foot exam below.  Skin: Normal texture and temperature to palpation. No rash noted. No Acanthosis nigricans/skin tags. No lipohypertrophy.  Neuro: MS is good with appropriate affect, pt is alert and Ox3    DM foot exam: Please see diabetic assessment flow-sheet detailed below:           DATA REVIEWED:  Lab Results  Component Value Date   HGBA1C 8.6 (A) 01/20/2022   HGBA1C 10.7 (A)  11/30/2021   HGBA1C 10.2 (A) 07/29/2021   Lab Results  Component Value Date   MICROALBUR 0.7 02/11/2020   LDLCALC 161 (H) 05/17/2019   CREATININE 0.84 03/10/2021   Lab Results  Component Value Date   MICRALBCREAT 0.6 02/11/2020     Lab Results  Component Value Date   CHOL 243 (H) 02/11/2020   HDL 84.80 02/11/2020   LDLCALC 161 (H) 05/17/2019   LDLDIRECT 106.0 02/11/2020   TRIG 207.0 (H) 02/11/2020   CHOLHDL 3 02/11/2020         ASSESSMENT / PLAN / RECOMMENDATIONS:   1) Type {NUMBERS 1 OR 2:522190} Diabetes Mellitus, ***controlled, With *** complications - Most recent A1c of *** %. Goal A1c < *** %.  ***  Plan: MEDICATIONS: ***  EDUCATION / INSTRUCTIONS: BG monitoring instructions: Patient is instructed to check her blood sugars *** times a day, ***. Call [facility] if: BG persistently < 70 or > 300. I reviewed the Rule of 15 for the treatment of hypoglycemia in detail with the patient. Literature supplied.  REFERRALS: ***.   2) Diabetic complications:  Eye: Does *** have known diabetic retinopathy.  Neuro/ Feet: Does *** have known diabetic peripheral neuropathy .  Renal: Patient does *** have known baseline CKD. She   is *** on an ACEI/ARB at present. Check urine albumin/creatinine ratio yearly starting at time of diagnosis. If albuminuria is positive, treatment is geared toward better glucose, blood pressure control and use of ACE inhibitors or ARBs. Monitor electrolytes and creatinine once to twice yearly.   3) Lipids: Patient is *** on a statin.  4) Hypertension: *** at goal of < 140/90 mmHg.    F/U in ***    Signed electronically by: Mack Guise, MD  [facility]  [employer] [facility] [address] [place], [place] [address] Phone: [phone] FAX: [phone]   CC: [address] [place] [address] Phone: [phone]  Fax:  [phone]  Return to Endocrinology clinic as below: Future Appointments  Date Time Provider [place]  04/19/2022  2:00 PM Weslie Pretlow, Melanie Crazier, MD [facility] None  07/11/2022  3:45 PM Rexene Alberts  M, DO [facility] None

## 2022-07-11 ENCOUNTER — Ambulatory Visit: Payer: 59 | Admitting: Allergy

## 2022-07-19 NOTE — Progress Notes (Unsigned)
Follow Up Note  RE: Claire ARSENEAULT MRN: [id number] DOB: [birth year] Date of Office Visit: 07/20/2022  Referring provider: Carollee Herter, Alferd Apa, * Primary care provider: Carollee Herter, Alferd Apa, DO  Chief Complaint: Seasonal and Perennial Allergic Rhinoconjunctivitis (6 mth f/u - Very bad)  History of Present Illness: I had the pleasure of seeing Claire Hill for a follow up visit at the [facility] on 07/20/2022. She is a 51 y.o. female, who is being followed for allergic rhino conjunctivitis and atopic dermatitis. Her previous allergy office visit was on 01/05/2022 with Dr. Maudie Mercury. Today is a regular follow up visit.  Seasonal and perennial allergic rhinoconjunctivitis Noted some itchy eyes for the past 2 months.  Currently taking Moxifloxacin eye drops. She never got the ones that I sent in at the last visit. Taking azelastine 1 spray per nostril BID, Xyzal daily, Singulair daily. Takes benadryl prn as needed.   Wants to see if her insurance will cover allergy shots.   Other atopic dermatitis Using Eucrisa prn with good benefit. Sometimes has rash on the face and hands.   Assessment and Plan: Claire Hill is a 51 y.o. female with: Seasonal and perennial allergic rhinoconjunctivitis Past history - perennial rhinoconjunctivitis symptoms for the past 2 years. Does not recall previous ENT work-up. 2020 skin testing showed: Positive to grass pollen, weed, ragweed, dust mites and cat.  Discussed AIT with interpreter services.  Interim history - increased symptoms. Interested in starting AIT if insurance covers it. Continue environmental control measures. May take levocetirizine 5mg  daily for allergies.  Continue Singulair 10mg  daily at night. Use azelastine nasal spray 1-2 sprays per nostril twice a day as needed for runny nose/drainage. Use Flonase (fluticasone) nasal spray 1 spray per nostril twice a day as needed for nasal congestion.  Nasal saline spray  (i.e., Simply Saline) or nasal saline lavage (i.e., NeilMed) is recommended as needed and prior to medicated nasal sprays. Use Optivar (azelastine) eye drops 0.05% twice a day as needed for itchy/watery eyes. Consider allergy injections for long term control if above medications do not help the symptoms - handout given.  Call your insurance company to see if it's covered. Let us know when ready to start.   Other atopic dermatitis Controlled.  Continue proper skin care measures.  Use Eucrisa (crisaborole) 2% ointment twice a day on mild rash flares on the face and body. This is a non-steroid ointment.  Return in about 6 months (around 01/18/2023).  Meds ordered this encounter  Medications   azelastine (OPTIVAR) 0.05 % ophthalmic solution    Sig: Place 1 drop into both eyes 2 (two) times daily as needed (itchy/watery eyes).    Dispense:  6 mL    Refill:  3   montelukast (SINGULAIR) 10 MG tablet    Sig: Take 1 tablet (10 mg total) by mouth at bedtime.    Dispense:  90 tablet    Refill:  3   levocetirizine (XYZAL) 5 MG tablet    Sig: Take 1 tablet (5 mg total) by mouth every evening.    Dispense:  90 tablet    Refill:  3   azelastine (ASTELIN) 0.1 % nasal spray    Sig: Take 1-2 sprays twice a day as needed for runny nose.    Dispense:  30 mL    Refill:  5   Crisaborole (EUCRISA) 2 % OINT    Sig: Apply 1 Application topically 2 (two) times daily as needed (  mild rash).    Dispense:  100 g    Refill:  5   Lab Orders  No laboratory test(s) ordered today    Diagnostics: None.   Medication List:  Current Outpatient Medications  Medication Sig Dispense Refill   azelastine (ASTELIN) 0.1 % nasal spray Take 1-2 sprays twice a day as needed for runny nose. 30 mL 5   azelastine (OPTIVAR) 0.05 % ophthalmic solution Place 1 drop into both eyes 2 (two) times daily as needed (itchy/watery eyes). 6 mL 3   blood glucose meter kit and supplies KIT Use a directed twice a day.  E11.8.  Use  Relion Meter 1 each 0   blood glucose meter kit and supplies KIT Dispense based on patient and insurance preference. Use up to four times daily as directed. (FOR ICD-9 250.00, 250.01). 1 each 0   blood glucose meter kit and supplies KIT Dispense based on patient and insurance preference. Use up to four times daily as directed. (FOR ICD-9 250.00, 250.01). 1 each 0   Crisaborole (EUCRISA) 2 % OINT Apply 1 Application topically 2 (two) times daily as needed (mild rash). 100 g 5   fluticasone (FLONASE) 50 MCG/ACT nasal spray Take 1-2 sprays daily for nasal congestion. 16 g 5   insulin glargine-yfgn (SEMGLEE, YFGN,) 100 UNIT/ML Pen Inject 15 Units into the skin every morning. And pen needles 1/day 15 mL 3   Insulin Pen Needle (PEN NEEDLES) 32G X 4 MM MISC Use As Directed 100 each 5   Lancets (ONETOUCH DELICA PLUS [id number]) MISC USE TO CHECK BLOOD SUGAR UP TO FOUR TIMES A DAY 100 each 0   levocetirizine (XYZAL) 5 MG tablet Take 1 tablet (5 mg total) by mouth every evening. 90 tablet 3   montelukast (SINGULAIR) 10 MG tablet Take 1 tablet (10 mg total) by mouth at bedtime. 90 tablet 3   Multiple Vitamins-Minerals (ICAPS) TABS Take by mouth.     ONETOUCH ULTRA test strip USE TO CHECK BLOOD SUGAR UP TO 4 TIMES A DAY AS DIRECTED 50 strip 1   Semaglutide (RYBELSUS) 7 MG TABS Take 7 mg by mouth daily. 90 tablet 3   valACYclovir (VALTREX) 1000 MG tablet Take 1,000 mg by mouth daily.     No current facility-administered medications for this visit.   Allergies: No Known Allergies I reviewed her past medical history, social history, family history, and environmental history and no significant changes have been reported from her previous visit.  Review of Systems  Constitutional:  Negative for appetite change, chills, fever and unexpected weight change.  HENT:  Negative for congestion, rhinorrhea and sneezing.   Eyes:  Positive for redness and itching.  Respiratory:  Negative for cough, chest tightness,  shortness of breath and wheezing.   Cardiovascular:  Negative for chest pain.  Gastrointestinal:  Negative for abdominal pain.  Genitourinary:  Negative for difficulty urinating.  Skin:  Positive for rash.  Allergic/Immunologic: Positive for environmental allergies. Negative for food allergies.  Neurological:  Negative for headaches.    Objective: BP 112/70   Pulse 66   Temp (!) 97.4 F (36.3 C)   Resp 16   Ht $R'5\' 8"'Gh$  (1.727 m)   Wt 158 lb 3.2 oz (71.8 kg)   LMP  (LMP Unknown) Comment: patient could not remember last period 05/15/17  SpO2 100%   BMI 24.05 kg/m  Body mass index is 24.05 kg/m. Physical Exam Vitals and nursing note reviewed.  Constitutional:      Appearance: Normal  appearance. She is well-developed.  HENT:     Head: Normocephalic and atraumatic.     Right Ear: Tympanic membrane and external ear normal.     Left Ear: Tympanic membrane and external ear normal.     Nose: Nose normal.     Mouth/Throat:     Mouth: Mucous membranes are moist.     Pharynx: Oropharynx is clear.  Eyes:     Conjunctiva/sclera: Conjunctivae normal.  Cardiovascular:     Rate and Rhythm: Normal rate and regular rhythm.     Heart sounds: Normal heart sounds. No murmur heard. Pulmonary:     Effort: Pulmonary effort is normal.     Breath sounds: Normal breath sounds. No wheezing, rhonchi or rales.  Musculoskeletal:     Cervical back: Neck supple.  Skin:    General: Skin is warm.     Findings: No rash.  Neurological:     Mental Status: She is alert and oriented to person, place, and time.  Psychiatric:        Behavior: Behavior normal.   Previous notes and tests were reviewed. The plan was reviewed with the patient/family, and all questions/concerned were addressed.  It was my pleasure to see Claire Hill today and participate in her care. Please feel free to contact me with any questions or concerns.  Sincerely,  Rexene Alberts, DO Allergy & Immunology  [facility] office: [place] office: [phone]

## 2022-07-20 ENCOUNTER — Ambulatory Visit: Payer: Commercial Managed Care - HMO | Admitting: Allergy

## 2022-07-20 ENCOUNTER — Encounter: Payer: Self-pay | Admitting: Allergy

## 2022-07-20 VITALS — BP 112/70 | HR 66 | Temp 97.4°F | Resp 16 | Ht 68.0 in | Wt 158.2 lb

## 2022-07-20 DIAGNOSIS — H1013 Acute atopic conjunctivitis, bilateral: Secondary | ICD-10-CM

## 2022-07-20 DIAGNOSIS — J302 Other seasonal allergic rhinitis: Secondary | ICD-10-CM | POA: Diagnosis not present

## 2022-07-20 DIAGNOSIS — H1010 Acute atopic conjunctivitis, unspecified eye: Secondary | ICD-10-CM

## 2022-07-20 DIAGNOSIS — L2089 Other atopic dermatitis: Secondary | ICD-10-CM | POA: Diagnosis not present

## 2022-07-20 MED ORDER — AZELASTINE HCL 0.1 % NA SOLN: NASAL | 5 refills | Status: AC

## 2022-07-20 MED ORDER — EUCRISA 2 % EX OINT: 1.0000 | TOPICAL_OINTMENT | Freq: Two times a day (BID) | CUTANEOUS | 5 refills | Status: DC | PRN

## 2022-07-20 MED ORDER — LEVOCETIRIZINE DIHYDROCHLORIDE 5 MG PO TABS: 5.0000 mg | ORAL_TABLET | Freq: Every evening | ORAL | 3 refills | Status: DC

## 2022-07-20 MED ORDER — AZELASTINE HCL 0.05 % OP SOLN: 1.0000 [drp] | Freq: Two times a day (BID) | OPHTHALMIC | 3 refills | Status: AC | PRN

## 2022-07-20 MED ORDER — MONTELUKAST SODIUM 10 MG PO TABS: 10.0000 mg | ORAL_TABLET | Freq: Every day | ORAL | 3 refills | Status: AC

## 2022-07-20 NOTE — Assessment & Plan Note (Signed)
Past history - perennial rhinoconjunctivitis symptoms for the past 2 years. Does not recall previous ENT work-up. 2020 skin testing showed: Positive to grass pollen, weed, ragweed, dust mites and cat.  Discussed AIT with interpreter services.  Interim history - increased symptoms. Interested in starting AIT if insurance covers it.  Continue environmental control measures.  May take levocetirizine '5mg'$  daily for allergies.   Continue Singulair '10mg'$  daily at night.  Use azelastine nasal spray 1-2 sprays per nostril twice a day as needed for runny nose/drainage. . Use Flonase (fluticasone) nasal spray 1 spray per nostril twice a day as needed for nasal congestion.  . Nasal saline spray (i.e., Simply Saline) or nasal saline lavage (i.e., NeilMed) is recommended as needed and prior to medicated nasal sprays. . Use Optivar (azelastine) eye drops 0.05% twice a day as needed for itchy/watery eyes.  Consider allergy injections for long term control if above medications do not help the symptoms - handout given.   Call your insurance company to see if it's covered.  Let us know when ready to start.

## 2022-07-20 NOTE — Assessment & Plan Note (Signed)
Controlled.   Continue proper skin care measures.   Use Eucrisa (crisaborole) 2% ointment twice a day on mild rash flares on the face and body. This is a non-steroid ointment.

## 2022-07-20 NOTE — Patient Instructions (Addendum)
Seasonal and perennial allergic rhinoconjunctivitis 2020 skin testing showed: Positive to grass pollen, weed, ragweed, dust mites and cat.   Continue environmental control measures. May take levoceterizine '5mg'$  daily for allergies.  Continue Singulair '10mg'$  daily at night. Use azelastine nasal spray 1-2 sprays per nostril twice a day as needed for runny nose/drainage. Use Flonase (fluticasone) nasal spray 1 spray per nostril twice a day as needed for nasal congestion.  Nasal saline spray (i.e., Simply Saline) or nasal saline lavage (i.e., NeilMed) is recommended as needed and prior to medicated nasal sprays. Use Optivar (azelastine) eye drops 0.05% twice a day as needed for itchy/watery eyes. Consider allergy injections for long term control if above medications do not help the symptoms - handout given.  Call your insurance company to see if it's covered. Let us know when ready to start.    Other atopic dermatitis Continue proper skin care measures.  Use Eucrisa (crisaborole) 2% ointment twice a day on mild rash flares on the face, hand and body. This is a non-steroid ointment. If it burns, place the medication in the refrigerator.  Apply a thin layer of moisturizer and then apply the Eucrisa on top of it.  Follow up in 6 months or sooner if needed.   Skin care recommendations  Bath time: Always use lukewarm water. AVOID very hot or cold water. Keep bathing time to 5-10 minutes. Do NOT use bubble bath. Use a mild soap and use just enough to wash the dirty areas. Do NOT scrub skin vigorously.  After bathing, pat dry your skin with a towel. Do NOT rub or scrub the skin.  Moisturizers and prescriptions:  ALWAYS apply moisturizers immediately after bathing (within 3 minutes). This helps to lock-in moisture. Use the moisturizer several times a day over the whole body. Good summer moisturizers include: Aveeno, CeraVe, Cetaphil. Good winter moisturizers include: Aquaphor, Vaseline, Cerave,  Cetaphil, Eucerin, Vanicream. When using moisturizers along with medications, the moisturizer should be applied about one hour after applying the medication to prevent diluting effect of the medication or moisturize around where you applied the medications. When not using medications, the moisturizer can be continued twice daily as maintenance.  Laundry and clothing: Avoid laundry products with added color or perfumes. Use unscented hypo-allergenic laundry products such as Tide free, Cheer free & gentle, and All free and clear.  If the skin still seems dry or sensitive, you can try double-rinsing the clothes. Avoid tight or scratchy clothing such as wool. Do not use fabric softeners or dyer sheets.

## 2022-08-15 ENCOUNTER — Ambulatory Visit: Payer: 59 | Admitting: Internal Medicine

## 2022-08-15 ENCOUNTER — Telehealth: Payer: Self-pay | Admitting: Internal Medicine

## 2022-08-15 MED ORDER — ONETOUCH DELICA PLUS LANCET33G MISC: 1 refills | Status: AC

## 2022-08-15 MED ORDER — ONETOUCH ULTRA VI STRP: ORAL_STRIP | 1 refills | Status: DC

## 2022-08-15 NOTE — Progress Notes (Deleted)
Name: Claire Hill  Age/ Sex: 51 y.o., female   MRN/ DOB: [id number], 13-Apr-1971     PCP: Carollee Herter, Alferd Apa, DO   Reason for Endocrinology Evaluation: Type 2 Diabetes Mellitus  Initial Endocrine Consultative Visit: ***    PATIENT IDENTIFIER: Claire Hill is a 51 y.o. female with a past medical history of DM, and HTN. The patient has followed with Endocrinology clinic since *** for consultative assistance with management of her diabetes.  DIABETIC HISTORY:  Ms. Fleece was diagnosed with DM 2012, and started insulin therapy in 2012 . Her hemoglobin A1c has ranged from 5.7% in 2018, peaking at 10.7% in 2023.   SUBJECTIVE:   During the last visit (***): ***  Today (08/15/2022): Claire Hill  She checks her blood sugars *** times daily. The patient has *** had hypoglycemic episodes since the last clinic visit, which typically occur *** x / - most often occuring ***. The patient is *** symptomatic with these episodes, with symptoms of {symptoms; hypoglycemia:[id number]}.      HOME DIABETES REGIMEN:  Rybelsus  Semglee 7 mg daily   Statin: no ACE-I/ARB: no    METER DOWNLOAD SUMMARY: Date range evaluated: *** Fingerstick Blood Glucose Tests = *** Average Number Tests/Day = *** Overall Mean FS Glucose = *** Standard Deviation = ***  BG Ranges: Low = *** High = ***   Hypoglycemic Events/30 Days: BG < 50 = *** Episodes of symptomatic severe hypoglycemia = ***    DIABETIC COMPLICATIONS: Microvascular complications:  Neuropathy Denies: CKD Last Eye Exam: Completed   Macrovascular complications:   Denies: CAD, CVA, PVD   HISTORY:  Past Medical History:  Past Medical History:  Diagnosis Date   Allergy    Anemia    Asthma    pt states no-no inhalers   COPD (chronic obstructive pulmonary disease) (HCC)    Diabetes mellitus    Diabetes mellitus without complication (HCC)    Glaucoma    Herpes    Right eye   Herpes simplex of eye     Hyperlipidemia    OCD (obsessive compulsive disorder)    Past Surgical History:  Past Surgical History:  Procedure Laterality Date   MASS EXCISION  06/22/2012   Procedure: EXCISION MASS;  Surgeon: Harl Bowie, MD;  Location: [place];  Service: General;  Laterality: Right;  Excision chronic Right axillary cyst   NO PAST SURGERIES     Social History:  reports that she has never smoked. She has never used smokeless tobacco. She reports that she does not drink alcohol and does not use drugs. Family History:  Family History  Problem Relation Age of Onset   Diabetes Father    Hypertension Mother    Diabetes Paternal Grandmother    Cancer Other        LUNG, BRAIN AND STOMACH    Cancer Cousin        ovarian     HOME MEDICATIONS: Allergies as of 08/15/2022   No Known Allergies      Medication List        Accurate as of August 15, 2022 12:54 PM. If you have any questions, ask your nurse or doctor.          azelastine 0.05 % ophthalmic solution Commonly known as: OPTIVAR Place 1 drop into both eyes 2 (two) times daily as needed (itchy/watery eyes).   azelastine 0.1 % nasal spray Commonly known as: ASTELIN Take 1-2 sprays twice a day as  needed for runny nose.   blood glucose meter kit and supplies Kit Use a directed twice a day.  E11.8.  Use Relion Meter   blood glucose meter kit and supplies Kit Dispense based on patient and insurance preference. Use up to four times daily as directed. (FOR ICD-9 250.00, 250.01).   blood glucose meter kit and supplies Kit Dispense based on patient and insurance preference. Use up to four times daily as directed. (FOR ICD-9 250.00, 250.01).   Eucrisa 2 % Oint Generic drug: Crisaborole Apply 1 Application topically 2 (two) times daily as needed (mild rash).   fluticasone 50 MCG/ACT nasal spray Commonly known as: FLONASE Take 1-2 sprays daily for nasal congestion.   ICAPS Tabs Take by mouth.   insulin  glargine-yfgn 100 UNIT/ML Pen Commonly known as: Semglee (yfgn) Inject 15 Units into the skin every morning. And pen needles 1/day   levocetirizine 5 MG tablet Commonly known as: Xyzal Take 1 tablet (5 mg total) by mouth every evening.   montelukast 10 MG tablet Commonly known as: Singulair Take 1 tablet (10 mg total) by mouth at bedtime.   OneTouch Delica Plus [id number] Misc USE TO CHECK BLOOD SUGAR UP TO FOUR TIMES A DAY   OneTouch Ultra test strip Generic drug: glucose blood USE TO CHECK BLOOD SUGAR UP TO 4 TIMES A DAY AS DIRECTED   Pen Needles 32G X 4 MM Misc Use As Directed   Rybelsus 7 MG Tabs Generic drug: Semaglutide Take 7 mg by mouth daily.   valACYclovir 1000 MG tablet Commonly known as: VALTREX Take 1,000 mg by mouth daily.         OBJECTIVE:   Vital Signs: LMP  (LMP Unknown) Comment: patient could not remember last period 05/15/17  Wt Readings from Last 3 Encounters:  07/20/22 158 lb 3.2 oz (71.8 kg)  04/07/22 154 lb 9.6 oz (70.1 kg)  01/20/22 152 lb (68.9 kg)     Exam: General: Pt appears well and is in NAD  Neck: General: Supple without adenopathy. Thyroid: Thyroid size normal.  No goiter or nodules appreciated.   Lungs: Clear with good BS bilat   Heart: RRR   Abdomen:  soft, nontender  Extremities: No pretibial edema.   Skin: Normal texture and temperature to palpation. No rash noted. No Acanthosis nigricans/skin tags.   Neuro: MS is good with appropriate affect, pt is alert and Ox3    DM foot exam: Please see diabetic assessment flow-sheet detailed below:           DATA REVIEWED:  Lab Results  Component Value Date   HGBA1C 8.6 (A) 01/20/2022   HGBA1C 10.7 (A) 11/30/2021   HGBA1C 10.2 (A) 07/29/2021   Lab Results  Component Value Date   MICROALBUR 0.7 02/11/2020   LDLCALC 161 (H) 05/17/2019   CREATININE 0.84 03/10/2021   Lab Results  Component Value Date   MICRALBCREAT 0.6 02/11/2020     Lab Results  Component Value  Date   CHOL 243 (H) 02/11/2020   HDL 84.80 02/11/2020   LDLCALC 161 (H) 05/17/2019   LDLDIRECT 106.0 02/11/2020   TRIG 207.0 (H) 02/11/2020   CHOLHDL 3 02/11/2020         ASSESSMENT / PLAN / RECOMMENDATIONS:   1) Type 2 Diabetes Mellitus, ***controlled, With neuropathic  complications - Most recent A1c of *** %. Goal A1c < 7.0 %.    Plan: MEDICATIONS: ***  EDUCATION / INSTRUCTIONS: BG monitoring instructions: Patient is instructed to check her blood  sugars *** times a day, ***. Call [place] [facility] if: BG persistently < 70  I reviewed the Rule of 15 for the treatment of hypoglycemia in detail with the patient. Literature supplied.    2) Diabetic complications:  Eye: Does *** have known diabetic retinopathy.  Neuro/ Feet: Does *** have known diabetic peripheral neuropathy .  Renal: Patient does *** have known baseline CKD. She   is *** on an ACEI/ARB at present.    3) Lipids: Patient is *** on a statin.    F/U in ***    Signed electronically by: Mack Guise, MD  [facility] Endocrinology  [facility] [address], [place] [address] Phone: [phone] FAX: [phone]   CC: [address] [place] [address] Phone: [phone]  Fax: [phone]  Return to Endocrinology clinic as below: Future Appointments  Date Time Provider [place]  08/15/2022  1:00 PM Jeffrey Voth, Melanie Crazier, MD [facility] None  01/16/2023  3:30 PM Garnet Sierras, DO [facility] None

## 2022-08-15 NOTE — Telephone Encounter (Signed)
Done

## 2022-08-15 NOTE — Telephone Encounter (Signed)
MEDICATION:  ONETOUCH ULTRA test strip, Lancets (ONETOUCH DELICA PLUS [id number]) MISC  PHARMACY:  [id number] - [place], [place] - [address] AT [address]  HAS THE PATIENT CONTACTED THEIR PHARMACY?   no  IS THIS A 90 DAY SUPPLY : YES  IS PATIENT OUT OF MEDICATION: NO  IF NOT; HOW MUCH IS LEFT:   LAST APPOINTMENT DATE: LATE ARRIVAL,NOT SEEN,08/15/22  NEXT APPOINTMENT DATE:'@2'$ /27/2024  DO WE HAVE YOUR PERMISSION TO LEAVE A DETAILED MESSAGE?:  OTHER COMMENTS:    **Let patient know to contact pharmacy at the end of the day to make sure medication is ready. **  ** Please notify patient to allow 48-72 hours to process**  **Encourage patient to contact the pharmacy for refills or they can request refills through [facility]**

## 2022-08-16 ENCOUNTER — Telehealth: Payer: Self-pay

## 2022-08-16 DIAGNOSIS — E119 Type 2 diabetes mellitus without complications: Secondary | ICD-10-CM

## 2022-08-16 MED ORDER — ONETOUCH ULTRA VI STRP: ORAL_STRIP | 1 refills | Status: DC

## 2022-08-16 MED ORDER — ONETOUCH VERIO W/DEVICE KIT: PACK | 0 refills | Status: AC

## 2022-08-16 MED ORDER — INSULIN GLARGINE-YFGN 100 UNIT/ML ~~LOC~~ SOPN: 15.0000 [IU] | PEN_INJECTOR | SUBCUTANEOUS | 3 refills | Status: DC

## 2022-08-19 ENCOUNTER — Telehealth: Payer: Self-pay | Admitting: Internal Medicine

## 2022-08-19 DIAGNOSIS — E119 Type 2 diabetes mellitus without complications: Secondary | ICD-10-CM

## 2022-08-19 MED ORDER — ONETOUCH ULTRA VI STRP: ORAL_STRIP | 1 refills | Status: AC

## 2022-08-19 MED ORDER — INSULIN GLARGINE-YFGN 100 UNIT/ML ~~LOC~~ SOPN: 15.0000 [IU] | PEN_INJECTOR | SUBCUTANEOUS | 3 refills | Status: DC

## 2022-08-19 NOTE — Telephone Encounter (Signed)
error 

## 2022-08-19 NOTE — Telephone Encounter (Signed)
done

## 2022-08-19 NOTE — Telephone Encounter (Signed)
MEDICATION: glucose blood (ONETOUCH ULTRA) test strip insulin glargine-yfgn (SEMGLEE, YFGN,) 100 UNIT/ML Pen PHARMACY:    HAS THE PATIENT CONTACTED THEIR PHARMACY?  [id number] - [place], [place] - [address] AT [facility] [address] (Ph: [phone])  IS THIS A 90 DAY SUPPLY : Yes  IS PATIENT OUT OF MEDICATION: NO  IF NOT; HOW MUCH IS LEFT: Unsure  LAST APPOINTMENT DATE: '@3'$ /30/22023  NEXT APPOINTMENT DATE:'@2'$ /27/2024  DO WE HAVE YOUR PERMISSION TO LEAVE A DETAILED MESSAGE?:YES  OTHER COMMENTS: Patient was using [place], but due to insurance has changed to [employer]   **Let patient know to contact pharmacy at the end of the day to make sure medication is ready. **  ** Please notify patient to allow 48-72 hours to process**  **Encourage patient to contact the pharmacy for refills or they can request refills through [facility]**

## 2022-12-20 ENCOUNTER — Ambulatory Visit: Payer: Commercial Managed Care - HMO | Admitting: Internal Medicine

## 2022-12-20 ENCOUNTER — Encounter: Payer: Self-pay | Admitting: Internal Medicine

## 2022-12-20 VITALS — BP 120/70 | HR 77 | Ht 68.0 in | Wt 157.0 lb

## 2022-12-20 DIAGNOSIS — E1165 Type 2 diabetes mellitus with hyperglycemia: Secondary | ICD-10-CM

## 2022-12-20 DIAGNOSIS — E785 Hyperlipidemia, unspecified: Secondary | ICD-10-CM | POA: Diagnosis not present

## 2022-12-20 DIAGNOSIS — H538 Other visual disturbances: Secondary | ICD-10-CM

## 2022-12-20 DIAGNOSIS — Z794 Long term (current) use of insulin: Secondary | ICD-10-CM

## 2022-12-20 DIAGNOSIS — E119 Type 2 diabetes mellitus without complications: Secondary | ICD-10-CM | POA: Diagnosis not present

## 2022-12-20 LAB — BASIC METABOLIC PANEL
BUN: 15 mg/dL (ref 6–23)
CO2: 31 mEq/L (ref 19–32)
Calcium: 10.2 mg/dL (ref 8.4–10.5)
Chloride: 99 mEq/L (ref 96–112)
Creatinine, Ser: 0.65 mg/dL (ref 0.40–1.20)
GFR: 101.82 mL/min (ref >60.00)
Glucose, Bld: 327 mg/dL — ABNORMAL HIGH (ref 70–99)
Potassium: 4.6 mEq/L (ref 3.5–5.1)
Sodium: 137 mEq/L (ref 135–145)

## 2022-12-20 LAB — POCT GLYCOSYLATED HEMOGLOBIN (HGB A1C): Hemoglobin A1C: 12.5 % — AB (ref 4.0–5.6)

## 2022-12-20 LAB — MICROALBUMIN / CREATININE URINE RATIO
Creatinine,U: 77.1 mg/dL
Microalb Creat Ratio: 4.5 mg/g (ref 0.0–30.0)
Microalb, Ur: 3.5 mg/dL — ABNORMAL HIGH (ref 0.0–1.9)

## 2022-12-20 LAB — LIPID PANEL
Cholesterol: 265 mg/dL — ABNORMAL HIGH (ref 0–200)
HDL: 76.6 mg/dL (ref >39.00)
LDL Cholesterol: 161 mg/dL — ABNORMAL HIGH (ref 0–99)
NonHDL: 188.73
Total CHOL/HDL Ratio: 3
Triglycerides: 137 mg/dL (ref 0.0–149.0)
VLDL: 27.4 mg/dL (ref 0.0–40.0)

## 2022-12-20 MED ORDER — INSULIN GLARGINE-YFGN 100 UNIT/ML ~~LOC~~ SOPN: 16.0000 [IU] | PEN_INJECTOR | SUBCUTANEOUS | 4 refills | Status: DC

## 2022-12-20 MED ORDER — PEN NEEDLES 32G X 4 MM MISC: 3 refills | Status: DC

## 2022-12-20 MED ORDER — RYBELSUS 14 MG PO TABS: 14.0000 mg | ORAL_TABLET | Freq: Every day | ORAL | 3 refills | Status: DC

## 2022-12-20 NOTE — Patient Instructions (Addendum)
Increase Rybelsus 14 mg, 1 tablet every morning  Increase Semglee to 16 units daily    HOW TO TREAT LOW BLOOD SUGARS (Blood sugar LESS THAN 70 MG/DL) Please follow the RULE OF 15 for the treatment of hypoglycemia treatment (when your (blood sugars are less than 70 mg/dL)   STEP 1: Take 15 grams of carbohydrates when your blood sugar is low, which includes:  3-4 GLUCOSE TABS  OR 3-4 OZ OF JUICE OR REGULAR SODA OR ONE TUBE OF GLUCOSE GEL    STEP 2: RECHECK blood sugar in 15 MINUTES STEP 3: If your blood sugar is still low at the 15 minute recheck --> then, go back to STEP 1 and treat AGAIN with another 15 grams of carbohydrates.

## 2022-12-20 NOTE — Progress Notes (Unsigned)
Name: Claire Hill  Age/ Sex: 52 y.o., female   MRN/ DOB: [id number], [birth year]     PCP: Ann Held, DO   Reason for Endocrinology Evaluation: Type 2 Diabetes Mellitus  Initial Endocrine Consultative Visit: 04/12/2013    PATIENT IDENTIFIER: [name] is a 52 y.o. female with a past medical history of Htn, DM and Asthma . The patient has followed with Endocrinology clinic since 04/12/2013 for consultative assistance with management of her diabetes.  DIABETIC HISTORY:  Ms. Sugawara was diagnosed with DM 2012, and started insulin in 2022. Her hemoglobin A1c has ranged from 5.7% in 2018, peaking at 10.7% in 2023.   The patient was followed by Dr. Loanne Drilling from 2014 until 12/2021   SUBJECTIVE:   During the last visit (01/20/2022): Saw Dr. Loanne Drilling  Today (12/20/2022): Ms. Sherba is here for follow-up on diabetes management. She checks her blood sugars 0 times daily.   She has not taken Semglee  She did not have Rybelsus in middle of December  She ran out of insulin needles and has not taken it since 12 units   Denies vomiting or diarrhea  Has polydipsia  Has noted blurry vision  Pt noted burning sensation in the right ear   HOME DIABETES REGIMEN:  Rybelsus 7 mg daily Semglee 14 units daily    Statin: no ACE-I/ARB: no    METER DOWNLOAD SUMMARY: unable to download  [phone]    DIABETIC COMPLICATIONS: Microvascular complications:   Denies: CKD Last Eye Exam: Completed 2020  Macrovascular complications:   Denies: CAD, CVA, PVD   HISTORY:  Past Medical History:  Past Medical History:  Diagnosis Date  . Allergy   . Anemia   . Asthma    pt states no-no inhalers  . COPD (chronic obstructive pulmonary disease) ([place])   . Diabetes mellitus   . Diabetes mellitus without complication ([place])   . Glaucoma   . Herpes    Right eye  . Herpes simplex of eye   . Hyperlipidemia   . OCD (obsessive compulsive disorder)    Past Surgical  History:  Past Surgical History:  Procedure Laterality Date  . MASS EXCISION  06/22/2012   Procedure: EXCISION MASS;  Surgeon: Harl Bowie, MD;  Location: [facility];  Service: General;  Laterality: Right;  Excision chronic Right axillary cyst  . NO PAST SURGERIES     Social History:  reports that she has never smoked. She has never used smokeless tobacco. She reports that she does not drink alcohol and does not use drugs. Family History:  Family History  Problem Relation Age of Onset  . Diabetes Father   . Hypertension Mother   . Diabetes Paternal Grandmother   . Cancer Other        LUNG, BRAIN AND STOMACH   . Cancer Cousin        ovarian     HOME MEDICATIONS: Allergies as of 12/20/2022   No Known Allergies      Medication List        Accurate as of December 20, 2022 11:00 AM. If you have any questions, ask your nurse or doctor.          azelastine 0.05 % ophthalmic solution Commonly known as: OPTIVAR Place 1 drop into both eyes 2 (two) times daily as needed (itchy/watery eyes).   azelastine 0.1 % nasal spray Commonly known as: ASTELIN Take 1-2 sprays twice a day as needed for runny nose.  blood glucose meter kit and supplies Kit Use a directed twice a day.  E11.8.  Use Relion Meter   blood glucose meter kit and supplies Kit Dispense based on patient and insurance preference. Use up to four times daily as directed. (FOR ICD-9 250.00, 250.01).   blood glucose meter kit and supplies Kit Dispense based on patient and insurance preference. Use up to four times daily as directed. (FOR ICD-9 250.00, 250.01).   Eucrisa 2 % Oint Generic drug: Crisaborole Apply 1 Application topically 2 (two) times daily as needed (mild rash).   fluticasone 50 MCG/ACT nasal spray Commonly known as: FLONASE Take 1-2 sprays daily for nasal congestion.   ICAPS Tabs Take by mouth.   insulin glargine-yfgn 100 UNIT/ML Pen Commonly known as: Semglee  (yfgn) Inject 16 Units into the skin every morning. And pen needles 1/day What changed: how much to take Changed by: Dorita Sciara, MD   levocetirizine 5 MG tablet Commonly known as: Xyzal Take 1 tablet (5 mg total) by mouth every evening.   montelukast 10 MG tablet Commonly known as: Singulair Take 1 tablet (10 mg total) by mouth at bedtime.   OneTouch Delica Plus 0000000 Misc USE TO CHECK BLOOD SUGAR UP TO FOUR TIMES A DAY   OneTouch Ultra test strip Generic drug: glucose blood USE TO CHECK BLOOD SUGAR UP TO 4 TIMES A DAY AS DIRECTED   OneTouch Verio w/Device Kit Use to check blood sugar up to 4 times a day   Pen Needles 32G X 4 MM Misc Use As Directed   Rybelsus 14 MG Tabs Generic drug: Semaglutide Take 1 tablet (14 mg total) by mouth daily. What changed:  medication strength how much to take Changed by: Dorita Sciara, MD   valACYclovir 1000 MG tablet Commonly known as: VALTREX Take 1,000 mg by mouth daily.         OBJECTIVE:   Vital Signs: BP 120/70 (BP Location: Left Arm, Patient Position: Sitting, Cuff Size: Small)   Pulse 77   Ht '5\' 8"'$  (1.727 m)   Wt 157 lb (71.2 kg)   LMP  (LMP Unknown) Comment: patient could not remember last period 05/15/17  SpO2 99%   BMI 23.87 kg/m   Wt Readings from Last 3 Encounters:  12/20/22 157 lb (71.2 kg)  07/20/22 158 lb 3.2 oz (71.8 kg)  04/07/22 154 lb 9.6 oz (70.1 kg)     Exam: General: Pt appears well and is in NAD Bilateral ears show intact external canals with shiny TM  Neck: General: Supple without adenopathy. Thyroid: Thyroid size normal.  No goiter or nodules appreciated.   Lungs: Clear with good BS bilat   Heart: RRR   Abdomen:  soft, nontender  Extremities: No pretibial edema.   Neuro: MS is good with appropriate affect, pt is alert and Ox3     DATA REVIEWED:  Lab Results  Component Value Date   HGBA1C 12.5 (A) 12/20/2022   HGBA1C 8.6 (A) 01/20/2022   HGBA1C 10.7 (A)  11/30/2021   Lab Results  Component Value Date   MICROALBUR 0.7 02/11/2020   LDLCALC 161 (H) 05/17/2019   CREATININE 0.84 03/10/2021   Lab Results  Component Value Date   MICRALBCREAT 0.6 02/11/2020     Lab Results  Component Value Date   CHOL 243 (H) 02/11/2020   HDL 84.80 02/11/2020   LDLCALC 161 (H) 05/17/2019   LDLDIRECT 106.0 02/11/2020   TRIG 207.0 (H) 02/11/2020   CHOLHDL 3 02/11/2020  ASSESSMENT / PLAN / RECOMMENDATIONS:   1) Type 2 Diabetes Mellitus, Poorly controlled, With neuropathic complications - Most recent A1c of 12.5 %. Goal A1c < 7.0 %.    - Poorlt controlled diabtes due to medication nonadherence -We discussed increasing Rybelsus as below -We will restart her insulin and increase the dose as below -Prescription for Rybelsus, Semglee, and pen needles have been sent to the pharmacy  MEDICATIONS: Increase Rybelsus 14 mg daily Increase Semglee to 16 units daily  EDUCATION / INSTRUCTIONS: BG monitoring instructions: Patient is instructed to check her blood sugars 1 times a day. Call [facility] if: BG persistently < 70  I reviewed the Rule of 15 for the treatment of hypoglycemia in detail with the patient. Literature supplied.    2) Diabetic complications:  Eye: Does not have known diabetic retinopathy.  Neuro/ Feet: Does  have known diabetic peripheral neuropathy .  Renal: Patient does not have known baseline CKD. She   is not on an ACEI/ARB at present.    3) Dyslipidemia :   -LDL above goal at 161 mg/DL -Will start statin therapy as below  Medication Start atorvastatin 10 mg daily  4) Blurry Vision:  -A referral has been placed to ophthalmology  F/U in 4 months     Signed electronically by: Mack Guise, MD  [facility]  [address] [place], [place] [address] Phone: [phone] FAX: [phone]   CC: Ann Held, DO [address] [place] [address] Phone: [phone]  Fax: [phone]  Return to Endocrinology clinic as below: Future Appointments  Date Time Provider [place]  01/16/2023  3:30 PM Garnet Sierras, DO [facility] None

## 2022-12-21 ENCOUNTER — Telehealth: Payer: Self-pay | Admitting: Internal Medicine

## 2022-12-21 MED ORDER — ATORVASTATIN CALCIUM 10 MG PO TABS: 10.0000 mg | ORAL_TABLET | Freq: Every day | ORAL | 3 refills | Status: DC

## 2022-12-21 NOTE — Telephone Encounter (Signed)
LMTCB

## 2022-12-21 NOTE — Telephone Encounter (Signed)
Patient advised and will pick medication up

## 2022-12-21 NOTE — Telephone Encounter (Signed)
Please let the patient know that her kidney function and urine are normal but that her cholesterol is very high.  I have prescribed atorvastatin 10 mg daily to lower her cholesterol   Thanks

## 2023-01-15 NOTE — Progress Notes (Unsigned)
Follow Up Note  RE: Claire Hill MRN: [id number] DOB: January 13, 1971 Date of Office Visit: 01/16/2023  Referring provider: Carollee Herter, Alferd Apa, * Primary care provider: Carollee Herter, Alferd Apa, DO  Chief Complaint: Seasonal and perennial allergic rhinocunjunctivitis, Follow-up, and itching  History of Present Illness: I had the pleasure of seeing Claire Hill for a follow up visit at the [facility] on 01/16/2023. She is a 52 y.o. female, who is being followed for allergic rhinoconjunctivitis and atopic dermatitis. Her previous allergy office visit was on 07/20/2022 with Dr. Maudie Mercury. Today is a regular follow up visit.  Skin  Noticed some itching the past 2 weeks on the torso and arms. She did use some new body wash at that time. She went back to her other body wash and doing better now.  Eucrisa helps on the hands.  Seasonal and perennial allergic rhinoconjunctivitis Right ear feeling warm for the past few days. Currently taking Singulair, Xyzal daily. Take Flonase and azelastine nasal spray prn. Not interested in AIT.  Assessment and Plan: Claire V is a 52 y.o. female with: Seasonal and perennial allergic rhinoconjunctivitis Past history - perennial rhinoconjunctivitis symptoms for the past 2 years. Does not recall previous ENT work-up. 2020 skin testing showed: Positive to grass pollen, weed, ragweed, dust mites and cat.  Discussed AIT with interpreter services.  Interim history - not interested in AIT. Some ear fullness.  Continue environmental control measures. May take levoceterizine 5mg  daily for allergies.  Continue Singulair 10mg  daily at night. Use azelastine nasal spray 1-2 sprays per nostril twice a day as needed for runny nose/drainage. Use Flonase (fluticasone) nasal spray 1 spray per nostril twice a day as needed for nasal congestion.  Nasal saline spray (i.e., Simply Saline) or nasal saline lavage (i.e., NeilMed) is recommended as  needed and prior to medicated nasal sprays. Use Optivar (azelastine) eye drops 0.05% twice a day as needed for itchy/watery eyes.  Other atopic dermatitis Skin flared after using a new body wash.  Continue proper skin care measures.  Use Eucrisa (crisaborole) 2% ointment twice a day on mild rash flares on the face and body. This is a non-steroid ointment. Samples given.  Return in about 6 months (around 07/19/2023).  Meds ordered this encounter  Medications   fluticasone (FLONASE) 50 MCG/ACT nasal spray    Sig: Place 1 spray into both nostrils 2 (two) times daily as needed (nasal congestion).    Dispense:  16 g    Refill:  5   Crisaborole (EUCRISA) 2 % OINT    Sig: Apply 1 Application topically 2 (two) times daily as needed (mild rash).    Dispense:  60 g    Refill:  5   Lab Orders  No laboratory test(s) ordered today    Diagnostics: None.   Medication List:  Current Outpatient Medications  Medication Sig Dispense Refill   atorvastatin (LIPITOR) 10 MG tablet Take 1 tablet (10 mg total) by mouth daily. 90 tablet 3   azelastine (ASTELIN) 0.1 % nasal spray Take 1-2 sprays twice a day as needed for runny nose. 30 mL 5   azelastine (OPTIVAR) 0.05 % ophthalmic solution Place 1 drop into both eyes 2 (two) times daily as needed (itchy/watery eyes). 6 mL 3   blood glucose meter kit and supplies KIT Use a directed twice a day.  E11.8.  Use Relion Meter 1 each 0   blood glucose meter kit and supplies KIT Dispense based on  patient and insurance preference. Use up to four times daily as directed. (FOR ICD-9 250.00, 250.01). 1 each 0   Blood Glucose Monitoring Suppl (ONETOUCH VERIO) w/Device KIT Use to check blood sugar up to 4 times a day 1 kit 0   Crisaborole (EUCRISA) 2 % OINT Apply 1 Application topically 2 (two) times daily as needed (mild rash). 60 g 5   fluticasone (FLONASE) 50 MCG/ACT nasal spray Place 1 spray into both nostrils 2 (two) times daily as needed (nasal congestion). 16 g 5    insulin glargine-yfgn (SEMGLEE, YFGN,) 100 UNIT/ML Pen Inject 16 Units into the skin every morning. And pen needles 1/day 15 mL 4   Insulin Pen Needle (PEN NEEDLES) 32G X 4 MM MISC Use As Directed 100 each 3   Lancets (ONETOUCH DELICA PLUS 123XX123) MISC USE TO CHECK BLOOD SUGAR UP TO FOUR TIMES A DAY 300 each 1   levocetirizine (XYZAL) 5 MG tablet Take 1 tablet (5 mg total) by mouth every evening. 90 tablet 3   montelukast (SINGULAIR) 10 MG tablet Take 1 tablet (10 mg total) by mouth at bedtime. 90 tablet 3   Multiple Vitamins-Minerals (ICAPS) TABS Take by mouth.     Semaglutide (RYBELSUS) 14 MG TABS Take 1 tablet (14 mg total) by mouth daily. 90 tablet 3   valACYclovir (VALTREX) 1000 MG tablet Take 1,000 mg by mouth daily.     blood glucose meter kit and supplies KIT Dispense based on patient and insurance preference. Use up to four times daily as directed. (FOR ICD-9 250.00, 250.01). 1 each 0   glucose blood (ONETOUCH ULTRA) test strip USE TO CHECK BLOOD SUGAR UP TO 4 TIMES A DAY AS DIRECTED 300 strip 1   No current facility-administered medications for this visit.   Allergies: No Known Allergies I reviewed her past medical history, social history, family history, and environmental history and no significant changes have been reported from her previous visit.  Review of Systems  Constitutional:  Negative for appetite change, chills, fever and unexpected weight change.  HENT:  Negative for congestion, rhinorrhea and sneezing.   Eyes:  Negative for redness and itching.  Respiratory:  Negative for cough, chest tightness, shortness of breath and wheezing.   Cardiovascular:  Negative for chest pain.  Gastrointestinal:  Negative for abdominal pain.  Genitourinary:  Negative for difficulty urinating.  Skin:  Negative for rash.  Allergic/Immunologic: Positive for environmental allergies. Negative for food allergies.  Neurological:  Negative for headaches.    Objective: BP 98/60   Pulse  96   Temp 98.5 F (36.9 C) (Temporal)   Resp 18   Ht 5\' 8"  (1.727 m)   Wt 160 lb 3.2 oz (72.7 kg)   LMP  (LMP Unknown) Comment: patient could not remember last period 05/15/17  SpO2 95%   BMI 24.36 kg/m  Body mass index is 24.36 kg/m. Physical Exam Vitals and nursing note reviewed.  Constitutional:      Appearance: Normal appearance. She is well-developed.  HENT:     Head: Normocephalic and atraumatic.     Right Ear: Tympanic membrane and external ear normal.     Left Ear: Tympanic membrane and external ear normal.     Nose: Nose normal.     Mouth/Throat:     Mouth: Mucous membranes are moist.     Pharynx: Oropharynx is clear.  Eyes:     Conjunctiva/sclera: Conjunctivae normal.  Cardiovascular:     Rate and Rhythm: Normal rate and regular rhythm.  Heart sounds: Normal heart sounds. No murmur heard. Pulmonary:     Effort: Pulmonary effort is normal.     Breath sounds: Normal breath sounds. No wheezing, rhonchi or rales.  Musculoskeletal:     Cervical back: Neck supple.  Skin:    General: Skin is warm.     Findings: No rash.  Neurological:     Mental Status: She is alert and oriented to person, place, and time.  Psychiatric:        Behavior: Behavior normal.   Previous notes and tests were reviewed. The plan was reviewed with the patient/family, and all questions/concerned were addressed.  It was my pleasure to see Claire V today and participate in her care. Please feel free to contact me with any questions or concerns.  Sincerely,  Rexene Alberts, DO Allergy & Immunology  [facility] office: [place] office: [phone]

## 2023-01-16 ENCOUNTER — Encounter: Payer: Self-pay | Admitting: Allergy

## 2023-01-16 ENCOUNTER — Ambulatory Visit (INDEPENDENT_AMBULATORY_CARE_PROVIDER_SITE_OTHER): Payer: Commercial Managed Care - HMO | Admitting: Allergy

## 2023-01-16 ENCOUNTER — Other Ambulatory Visit: Payer: Self-pay

## 2023-01-16 VITALS — BP 98/60 | HR 96 | Temp 98.5°F | Resp 18 | Ht 68.0 in | Wt 160.2 lb

## 2023-01-16 DIAGNOSIS — L2089 Other atopic dermatitis: Secondary | ICD-10-CM

## 2023-01-16 DIAGNOSIS — H1013 Acute atopic conjunctivitis, bilateral: Secondary | ICD-10-CM

## 2023-01-16 DIAGNOSIS — J302 Other seasonal allergic rhinitis: Secondary | ICD-10-CM

## 2023-01-16 DIAGNOSIS — H1010 Acute atopic conjunctivitis, unspecified eye: Secondary | ICD-10-CM

## 2023-01-16 MED ORDER — EUCRISA 2 % EX OINT: 1.0000 | TOPICAL_OINTMENT | Freq: Two times a day (BID) | CUTANEOUS | 5 refills | Status: AC | PRN

## 2023-01-16 MED ORDER — FLUTICASONE PROPIONATE 50 MCG/ACT NA SUSP: 1.0000 | Freq: Two times a day (BID) | NASAL | 5 refills | Status: AC | PRN

## 2023-01-16 NOTE — Patient Instructions (Addendum)
Seasonal and perennial allergic rhinoconjunctivitis 2020 skin testing showed: Positive to grass pollen, weed, ragweed, dust mites and cat.   Continue environmental control measures. May take levoceterizine 5mg  daily for allergies.  Continue Singulair 10mg  daily at night. Use azelastine nasal spray 1-2 sprays per nostril twice a day as needed for runny nose/drainage. Use Flonase (fluticasone) nasal spray 1 spray per nostril twice a day as needed for nasal congestion.  Nasal saline spray (i.e., Simply Saline) or nasal saline lavage (i.e., NeilMed) is recommended as needed and prior to medicated nasal sprays. Use Optivar (azelastine) eye drops 0.05% twice a day as needed for itchy/watery eyes.   Other atopic dermatitis Continue proper skin care measures.  Use Eucrisa (crisaborole) 2% ointment twice a day on mild rash flares on the face, hand and body. This is a non-steroid ointment. If it burns, place the medication in the refrigerator.  Apply a thin layer of moisturizer and then apply the Eucrisa on top of it.  Follow up in 6 months or sooner if needed.   Skin care recommendations  Bath time: Always use lukewarm water. AVOID very hot or cold water. Keep bathing time to 5-10 minutes. Do NOT use bubble bath. Use a mild soap and use just enough to wash the dirty areas. Do NOT scrub skin vigorously.  After bathing, pat dry your skin with a towel. Do NOT rub or scrub the skin.  Moisturizers and prescriptions:  ALWAYS apply moisturizers immediately after bathing (within 3 minutes). This helps to lock-in moisture. Use the moisturizer several times a day over the whole body. Good summer moisturizers include: Aveeno, CeraVe, Cetaphil. Good winter moisturizers include: Aquaphor, Vaseline, Cerave, Cetaphil, Eucerin, Vanicream. When using moisturizers along with medications, the moisturizer should be applied about one hour after applying the medication to prevent diluting effect of the medication  or moisturize around where you applied the medications. When not using medications, the moisturizer can be continued twice daily as maintenance.  Laundry and clothing: Avoid laundry products with added color or perfumes. Use unscented hypo-allergenic laundry products such as Tide free, Cheer free & gentle, and All free and clear.  If the skin still seems dry or sensitive, you can try double-rinsing the clothes. Avoid tight or scratchy clothing such as wool. Do not use fabric softeners or dyer sheets.

## 2023-01-16 NOTE — Assessment & Plan Note (Signed)
Past history - perennial rhinoconjunctivitis symptoms for the past 2 years. Does not recall previous ENT work-up. 2020 skin testing showed: Positive to grass pollen, weed, ragweed, dust mites and cat.  Discussed AIT with interpreter services.  Interim history - not interested in AIT. Some ear fullness.  Continue environmental control measures. May take levoceterizine 5mg  daily for allergies.  Continue Singulair 10mg  daily at night. Use azelastine nasal spray 1-2 sprays per nostril twice a day as needed for runny nose/drainage. Use Flonase (fluticasone) nasal spray 1 spray per nostril twice a day as needed for nasal congestion.  Nasal saline spray (i.e., Simply Saline) or nasal saline lavage (i.e., NeilMed) is recommended as needed and prior to medicated nasal sprays. Use Optivar (azelastine) eye drops 0.05% twice a day as needed for itchy/watery eyes.

## 2023-01-16 NOTE — Assessment & Plan Note (Signed)
Skin flared after using a new body wash.  Continue proper skin care measures.  Use Eucrisa (crisaborole) 2% ointment twice a day on mild rash flares on the face and body. This is a non-steroid ointment. Samples given.

## 2023-01-30 ENCOUNTER — Telehealth: Payer: Self-pay | Admitting: Internal Medicine

## 2023-01-30 MED ORDER — BASAGLAR KWIKPEN 100 UNIT/ML ~~LOC~~ SOPN: 16.0000 [IU] | PEN_INJECTOR | Freq: Every day | SUBCUTANEOUS | 3 refills | Status: DC

## 2023-01-30 NOTE — Telephone Encounter (Signed)
Pt contacted the office to advise her insurance did not cover one of the two medications sent to the pharmacy. Advised an alternative was sent to the pharmacy for [facility] and she needs to contact the pharmacy to ensure Rybelsus and Basaglar is ready for pick up as well.

## 2023-01-30 NOTE — Telephone Encounter (Signed)
Semglee is not covered by insurance. Alternative is Hospital doctor.

## 2023-01-30 NOTE — Telephone Encounter (Signed)
MEDICATION: insulin glargine-yfgn insulin glargine-yfgn (SEMGLEE, YFGN,) 100 UNIT/ML Pen  PHARMACY:    [id number] - [place], [place] - [address] (Ph: [phone])    HAS THE PATIENT CONTACTED THEIR PHARMACY?  Yes  IS THIS A 90 DAY SUPPLY : Yes  IS PATIENT OUT OF MEDICATION: Yes  IF NOT; HOW MUCH IS LEFT:   LAST APPOINTMENT DATE: @2 /27/2024  NEXT APPOINTMENT DATE:@9 /12/2022  DO WE HAVE YOUR PERMISSION TO LEAVE A DETAILED MESSAGE?: Yes  OTHER COMMENTS:    **Let patient know to contact pharmacy at the end of the day to make sure medication is ready. **  ** Please notify patient to allow 48-72 hours to process**  **Encourage patient to contact the pharmacy for refills or they can request refills through [facility]**

## 2023-02-01 ENCOUNTER — Telehealth: Payer: Self-pay

## 2023-02-01 ENCOUNTER — Other Ambulatory Visit: Payer: Self-pay

## 2023-02-01 MED ORDER — BASAGLAR KWIKPEN 100 UNIT/ML ~~LOC~~ SOPN: 16.0000 [IU] | PEN_INJECTOR | Freq: Every day | SUBCUTANEOUS | 3 refills | Status: DC

## 2023-02-01 NOTE — Telephone Encounter (Signed)
Patient advised on Basaglar

## 2023-02-01 NOTE — Telephone Encounter (Signed)
Pt left a voicemail with name D.O.B and call back number with no additional information.

## 2023-03-23 ENCOUNTER — Ambulatory Visit: Payer: Commercial Managed Care - HMO | Admitting: Family Medicine

## 2023-06-27 ENCOUNTER — Ambulatory Visit (INDEPENDENT_AMBULATORY_CARE_PROVIDER_SITE_OTHER): Payer: Commercial Managed Care - HMO | Admitting: Internal Medicine

## 2023-06-27 ENCOUNTER — Encounter: Payer: Self-pay | Admitting: Internal Medicine

## 2023-06-27 VITALS — BP 100/72 | HR 70 | Ht 68.0 in | Wt 161.0 lb

## 2023-06-27 DIAGNOSIS — E1165 Type 2 diabetes mellitus with hyperglycemia: Secondary | ICD-10-CM

## 2023-06-27 DIAGNOSIS — Z7984 Long term (current) use of oral hypoglycemic drugs: Secondary | ICD-10-CM | POA: Diagnosis not present

## 2023-06-27 DIAGNOSIS — E785 Hyperlipidemia, unspecified: Secondary | ICD-10-CM | POA: Diagnosis not present

## 2023-06-27 DIAGNOSIS — Z794 Long term (current) use of insulin: Secondary | ICD-10-CM

## 2023-06-27 DIAGNOSIS — E119 Type 2 diabetes mellitus without complications: Secondary | ICD-10-CM

## 2023-06-27 LAB — POCT GLYCOSYLATED HEMOGLOBIN (HGB A1C): Hemoglobin A1C: 14 % — AB (ref 4.0–5.6)

## 2023-06-27 MED ORDER — RYBELSUS 14 MG PO TABS: 14.0000 mg | ORAL_TABLET | Freq: Every day | ORAL | 3 refills | Status: DC

## 2023-06-27 MED ORDER — ATORVASTATIN CALCIUM 10 MG PO TABS: 10.0000 mg | ORAL_TABLET | Freq: Every day | ORAL | 3 refills | Status: DC

## 2023-06-27 MED ORDER — PEN NEEDLES 32G X 4 MM MISC
3 refills | Status: DC
Start: 2023-06-27 — End: 2023-08-01

## 2023-06-27 MED ORDER — BASAGLAR KWIKPEN 100 UNIT/ML ~~LOC~~ SOPN: 16.0000 [IU] | PEN_INJECTOR | Freq: Every day | SUBCUTANEOUS | 4 refills | Status: DC

## 2023-06-27 NOTE — Patient Instructions (Addendum)
Continuar con Rybelsus 14 mg, 1 comprimido cada maana.  Tomar Basaglar 16 unidades una vez al da    ? CMO TRATAR LOS BAJOS DE AZCAR EN LA SANGRE (azcar en la sangre MENOS DE 70 MG/DL) ? Siga la REGLA DEL 15 para el tratamiento de la hipoglucemia (cuando su nivel de azcar en la sangre sea inferior a 70 mg/dL).   ? PASO 1: Tome 15 gramos de carbohidratos cuando su nivel de azcar en la sangre sea bajo, lo que incluye:  ? 3-4 COMPRIMIDOS DE GLUCOSA O ? 3-4 OZ DE JUGO O REFRESCO REGULAR O ? UN TUBO DE GEL DE GLUCOSA    ? PASO 2: REVISAR el nivel de azcar en sangre en 15 MINUTOS PASO 3: Si su nivel de azcar en la sangre an est bajo en el nuevo control de 15 minutos --> entonces, regrese al PASO 1 y trtelo OTRA VEZ con otros 15 gramos de carbohidratos.     Continue  Rybelsus 14 mg, 1 tablet every morning  Take Basaglar 16 units once daily    HOW TO TREAT LOW BLOOD SUGARS (Blood sugar LESS THAN 70 MG/DL) Please follow the RULE OF 15 for the treatment of hypoglycemia treatment (when your (blood sugars are less than 70 mg/dL)   STEP 1: Take 15 grams of carbohydrates when your blood sugar is low, which includes:  3-4 GLUCOSE TABS  OR 3-4 OZ OF JUICE OR REGULAR SODA OR ONE TUBE OF GLUCOSE GEL    STEP 2: RECHECK blood sugar in 15 MINUTES STEP 3: If your blood sugar is still low at the 15 minute recheck --> then, go back to STEP 1 and treat AGAIN with another 15 grams of carbohydrates.

## 2023-06-27 NOTE — Progress Notes (Signed)
Name: Claire Hill  Age/ Sex: 52 y.o., female   MRN/ DOB: [id number], 10/22/1971     PCP: Donato Schultz, DO   Reason for Endocrinology Evaluation: Type 2 Diabetes Mellitus  Initial Endocrine Consultative Visit: 04/12/2013    PATIENT IDENTIFIER: Claire Hill is a 52 y.o. female with a past medical history of Htn, DM and Asthma . The patient has followed with Endocrinology Hill since 04/12/2013 for consultative assistance with management of her diabetes.  DIABETIC HISTORY:  Claire Hill was diagnosed with DM 2012, and started insulin in 2022. Her hemoglobin A1c has ranged from 5.7% in 2018, peaking at 10.7% in 2023.   The patient was followed by Dr. Everardo All from 2014 until 12/2021  On her initial visit with me 11/2022 she had an A1c of 12.5%, she was on Rybelsus and Semglee which we adjusted   I also started her on atorvastatin 11/2022 with an LDL of 161 mg/DL   SUBJECTIVE:   During the last visit (12/20/2022): A1c 12.5%    Today (06/27/2023): Claire Hill is here for follow-up on diabetes management.  Patient states she checks her blood sugars  daily.  Glucose meter today had 1 reading of 359 mg/DL  Interpreter line was used  Denies nausea, or vomiting  Denies constipation or diarrhea   She had an episode approximately 2 weeks ago with her pressure and drainage, took aspirin, symptoms resolved  HOME DIABETES REGIMEN:  Rybelsus 14 mg daily Semglee 16 units daily- she takes between 1-16 units daily  Atorvastatin 10 mg daily    Statin: no ACE-I/ARB: no    METER DOWNLOAD SUMMARY: unable to download  1 reading of 359 mg/dL    DIABETIC COMPLICATIONS: Microvascular complications:   Denies: CKD Last Eye Exam: Completed 2020  Macrovascular complications:   Denies: CAD, CVA, PVD   HISTORY:  Past Medical History:  Past Medical History:  Diagnosis Date   Allergy    Anemia    Asthma    pt states no-no inhalers   COPD (chronic  obstructive pulmonary disease) (HCC)    Diabetes mellitus    Diabetes mellitus without complication (HCC)    Glaucoma    Herpes    Right eye   Herpes simplex of eye    Hyperlipidemia    OCD (obsessive compulsive disorder)    Past Surgical History:  Past Surgical History:  Procedure Laterality Date   MASS EXCISION  06/22/2012   Procedure: EXCISION MASS;  Surgeon: Shelly Rubenstein, MD;  Location: [facility];  Service: General;  Laterality: Right;  Excision chronic Right axillary cyst   NO PAST SURGERIES     Social History:  reports that she has never smoked. She has never used smokeless tobacco. She reports that she does not drink alcohol and does not use drugs. Family History:  Family History  Problem Relation Age of Onset   Diabetes Father    Hypertension Mother    Diabetes Paternal Grandmother    Cancer Other        LUNG, BRAIN AND STOMACH    Cancer Cousin        ovarian     HOME MEDICATIONS: Allergies as of 06/27/2023   No Known Allergies      Medication List        Accurate as of June 27, 2023 11:11 AM. If you have any questions, ask your nurse or doctor.          atorvastatin 10 MG  tablet Commonly known as: LIPITOR Take 1 tablet (10 mg total) by mouth daily.   azelastine 0.05 % ophthalmic solution Commonly known as: OPTIVAR Place 1 drop into both eyes 2 (two) times daily as needed (itchy/watery eyes).   azelastine 0.1 % nasal spray Commonly known as: ASTELIN Take 1-2 sprays twice a day as needed for runny nose.   Basaglar KwikPen 100 UNIT/ML Inject 16 Units into the skin daily.   blood glucose meter kit and supplies Kit Use a directed twice a day.  E11.8.  Use Relion Meter   blood glucose meter kit and supplies Kit Dispense based on patient and insurance preference. Use up to four times daily as directed. (FOR ICD-9 250.00, 250.01).   blood glucose meter kit and supplies Kit Dispense based on patient and insurance preference.  Use up to four times daily as directed. (FOR ICD-9 250.00, 250.01).   Eucrisa 2 % Oint Generic drug: Crisaborole Apply 1 Application topically 2 (two) times daily as needed (mild rash).   fluticasone 50 MCG/ACT nasal spray Commonly known as: FLONASE Place 1 spray into both nostrils 2 (two) times daily as needed (nasal congestion).   ICAPS Tabs Take by mouth.   insulin glargine-yfgn 100 UNIT/ML Pen Commonly known as: Semglee (yfgn) Inject 16 Units into the skin every morning. And pen needles 1/day   levocetirizine 5 MG tablet Commonly known as: Xyzal Take 1 tablet (5 mg total) by mouth every evening.   montelukast 10 MG tablet Commonly known as: Singulair Take 1 tablet (10 mg total) by mouth at bedtime.   OneTouch Delica Plus Lancet33G Misc USE TO CHECK BLOOD SUGAR UP TO FOUR TIMES A DAY   OneTouch Ultra test strip Generic drug: glucose blood USE TO CHECK BLOOD SUGAR UP TO 4 TIMES A DAY AS DIRECTED   OneTouch Verio w/Device Kit Use to check blood sugar up to 4 times a day   Pen Needles 32G X 4 MM Misc Use As Directed   Rybelsus 14 MG Tabs Generic drug: Semaglutide Take 1 tablet (14 mg total) by mouth daily.   valACYclovir 1000 MG tablet Commonly known as: VALTREX Take 1,000 mg by mouth daily.         OBJECTIVE:   Vital Signs: BP 100/72 (BP Location: Left Arm, Patient Position: Sitting, Cuff Size: Small)   Pulse 70   Ht 5\' 8"  (1.727 m)   Wt 161 lb (73 kg)   LMP  (LMP Unknown) Comment: patient could not remember last period 05/15/17  SpO2 94%   BMI 24.48 kg/m   Wt Readings from Last 3 Encounters:  06/27/23 161 lb (73 kg)  01/16/23 160 lb 3.2 oz (72.7 kg)  12/20/22 157 lb (71.2 kg)     Exam: General: Pt appears well and is in NAD  Lungs: Clear with good BS bilat   Heart: RRR   Abdomen:  soft, nontender  Extremities: No pretibial edema.   Neuro: MS is good with appropriate affect, pt is alert and Ox3     DATA REVIEWED:  Lab Results  Component  Value Date   HGBA1C 12.5 (A) 12/20/2022   HGBA1C 8.6 (A) 01/20/2022   HGBA1C 10.7 (A) 11/30/2021     ASSESSMENT / PLAN / RECOMMENDATIONS:   1) Type 2 Diabetes Mellitus, Poorly controlled, With neuropathic complications - Most recent A1c of 14.0 %. Goal A1c < 7.0 %.    -Patient continues with poorly controlled diabetes, I suspect due to medication nonadherence. -Somehow she has been taking  insulin 1 unit a day?.  Patient claims that her BG's running good at times at 125 mg/DL, and manual reviewing glucose meter there was 1 reading of 359 mg/DL -We emphasized the importance of following instructions, I will increase her insulin -My assistant verified proper use of insulin pen with the patient -We again emphasized the importance of optimizing glucose control to prevent microvascular complications to include blindness and ESRD -Patient to check glucose 3 times daily   MEDICATIONS: Continue  Rybelsus 14 mg daily Increase  Basaglar  to 16 units daily  EDUCATION / INSTRUCTIONS: BG monitoring instructions: Patient is instructed to check her blood sugars 3 times a day. Call Claire Hill if: BG persistently < 70  I reviewed the Rule of 15 for the treatment of hypoglycemia in detail with the patient. Literature supplied.    2) Diabetic complications:  Eye: Does not have known diabetic retinopathy.  Neuro/ Feet: Does  have known diabetic peripheral neuropathy .  Renal: Patient does not have known baseline CKD. She   is not on an ACEI/ARB at present.    3) Dyslipidemia :   -LDL above goal at 161 mg/DL -Started atorvastatin 06/5283 -She has been tolerating Atorvastatin , we again discussed cardiovascular benefits with statin use  Medication Continue atorvastatin 10 mg daily   F/U in 4 months     Signed electronically by: Lyndle Herrlich, MD  [facility]  [facility] [address] [place] [place], [place] [address] Phone:  [phone] FAX: [phone]   CC: Donato Schultz, DO [address] [place] [address] Phone: [phone]  Fax: [phone]  Return to Endocrinology Hill as below: Future Appointments  Date Time Provider Department Center  07/24/2023 10:45 AM Ellamae Sia, DO [facility] None

## 2023-07-23 NOTE — Progress Notes (Deleted)
Follow Up Note  RE: Claire Hill MRN: [id number] DOB: October 02, 1971 Date of Office Visit: 07/24/2023  Referring provider: Donato Schultz, * Primary care provider: Zola Button, Grayling Congress, DO  Chief Complaint: No chief complaint on file.  History of Present Illness: I had the pleasure of seeing Claire Hill for a follow up visit at the [facility] on 07/23/2023. She is a 52 y.o. female, who is being followed for allergic rhinoconjunctivitis and atopic dermatitis. Her previous allergy office visit was on 01/16/2023 with Dr. Selena Batten. Today is a regular follow up visit.  Discussed the use of AI scribe software for clinical note transcription with the patient, who gave verbal consent to proceed.  History of Present Illness            Seasonal and perennial allergic rhinoconjunctivitis Past history - perennial rhinoconjunctivitis symptoms for the past 2 years. Does not recall previous ENT work-up. 2020 skin testing showed: Positive to grass pollen, weed, ragweed, dust mites and cat.  Discussed AIT with interpreter services.  Interim history - not interested in AIT. Some ear fullness.  Continue environmental control measures. May take levoceterizine 5mg  daily for allergies.  Continue Singulair 10mg  daily at night. Use azelastine nasal spray 1-2 sprays per nostril twice a day as needed for runny nose/drainage. Use Flonase (fluticasone) nasal spray 1 spray per nostril twice a day as needed for nasal congestion.  Nasal saline spray (i.e., Simply Saline) or nasal saline lavage (i.e., NeilMed) is recommended as needed and prior to medicated nasal sprays. Use Optivar (azelastine) eye drops 0.05% twice a day as needed for itchy/watery eyes.   Other atopic dermatitis Skin flared after using a new body wash.  Continue proper skin care measures.  Use Eucrisa (crisaborole) 2% ointment twice a day on mild rash flares on the face and body. This is a non-steroid  ointment. Samples given.   Return in about 6 months (around 07/19/2023).  Assessment and Plan: Claire Hill is a 52 y.o. female with: Seasonal allergic rhinitis due to pollen Allergic rhinitis due to animal dander Allergic rhinitis due to dust mite Allergic conjunctivitis of both eyes Past history - perennial rhinoconjunctivitis symptoms for the past 2 years. Does not recall previous ENT work-up. 2020 skin testing showed: Positive to grass pollen, weed, ragweed, dust mites and cat.  Discussed AIT with interpreter services - not interested.  Interim history -   Other atopic dermatitis ***  Assessment and Plan              No follow-ups on file.  No orders of the defined types were placed in this encounter.  Lab Orders  No laboratory test(s) ordered today    Diagnostics: Spirometry:  Tracings reviewed. Her effort: {Blank single:19197::"Good reproducible efforts.","It was hard to get consistent efforts and there is a question as to whether this reflects a maximal maneuver.","Poor effort, data can not be interpreted."} FVC: ***L FEV1: ***L, ***% predicted FEV1/FVC ratio: ***% Interpretation: {Blank single:19197::"Spirometry consistent with mild obstructive disease","Spirometry consistent with moderate obstructive disease","Spirometry consistent with severe obstructive disease","Spirometry consistent with possible restrictive disease","Spirometry consistent with mixed obstructive and restrictive disease","Spirometry uninterpretable due to technique","Spirometry consistent with normal pattern","No overt abnormalities noted given today's efforts"}.  Please see scanned spirometry results for details.  Skin Testing: {Blank single:19197::"Select foods","Environmental allergy panel","Environmental allergy panel and select foods","Food allergy panel","None","Deferred due to recent antihistamines use"}. *** Results discussed with patient/family.   Medication List:  Current Outpatient  Medications  Medication Sig Dispense Refill  . atorvastatin (LIPITOR) 10 MG tablet Take 1 tablet (10 mg total) by mouth daily. 90 tablet 3  . azelastine (ASTELIN) 0.1 % nasal spray Take 1-2 sprays twice a day as needed for runny nose. 30 mL 5  . azelastine (OPTIVAR) 0.05 % ophthalmic solution Place 1 drop into both eyes 2 (two) times daily as needed (itchy/watery eyes). 6 mL 3  . blood glucose meter kit and supplies KIT Use a directed twice a day.  E11.8.  Use Relion Meter 1 each 0  . blood glucose meter kit and supplies KIT Dispense based on patient and insurance preference. Use up to four times daily as directed. (FOR ICD-9 250.00, 250.01). 1 each 0  . blood glucose meter kit and supplies KIT Dispense based on patient and insurance preference. Use up to four times daily as directed. (FOR ICD-9 250.00, 250.01). 1 each 0  . Blood Glucose Monitoring Suppl (ONETOUCH VERIO) w/Device KIT Use to check blood sugar up to 4 times a day 1 kit 0  . Crisaborole (EUCRISA) 2 % OINT Apply 1 Application topically 2 (two) times daily as needed (mild rash). 60 g 5  . fluticasone (FLONASE) 50 MCG/ACT nasal spray Place 1 spray into both nostrils 2 (two) times daily as needed (nasal congestion). 16 g 5  . glucose blood (ONETOUCH ULTRA) test strip USE TO CHECK BLOOD SUGAR UP TO 4 TIMES A DAY AS DIRECTED 300 strip 1  . Insulin Glargine (BASAGLAR KWIKPEN) 100 UNIT/ML Inject 16 Units into the skin daily. 15 mL 4  . Insulin Pen Needle (PEN NEEDLES) 32G X 4 MM MISC Use As Directed 100 each 3  . Lancets (ONETOUCH DELICA PLUS LANCET33G) MISC USE TO CHECK BLOOD SUGAR UP TO FOUR TIMES A DAY 300 each 1  . levocetirizine (XYZAL) 5 MG tablet Take 1 tablet (5 mg total) by mouth every evening. 90 tablet 3  . montelukast (SINGULAIR) 10 MG tablet Take 1 tablet (10 mg total) by mouth at bedtime. 90 tablet 3  . Multiple Vitamins-Minerals (ICAPS) TABS Take by mouth.    . Semaglutide (RYBELSUS) 14 MG TABS Take 1 tablet (14 mg total) by  mouth daily. 90 tablet 3  . valACYclovir (VALTREX) 1000 MG tablet Take 1,000 mg by mouth daily.     No current facility-administered medications for this visit.   Allergies: No Known Allergies I reviewed her past medical history, social history, family history, and environmental history and no significant changes have been reported from her previous visit.  Review of Systems  Constitutional:  Negative for appetite change, chills, fever and unexpected weight change.  HENT:  Negative for congestion, rhinorrhea and sneezing.   Eyes:  Negative for redness and itching.  Respiratory:  Negative for cough, chest tightness, shortness of breath and wheezing.   Cardiovascular:  Negative for chest pain.  Gastrointestinal:  Negative for abdominal pain.  Genitourinary:  Negative for difficulty urinating.  Skin:  Negative for rash.  Allergic/Immunologic: Positive for environmental allergies. Negative for food allergies.  Neurological:  Negative for headaches.   Objective: LMP  (LMP Unknown) Comment: patient could not remember last period 05/15/17 There is no height or weight on file to calculate BMI. Physical Exam Vitals and nursing note reviewed.  Constitutional:      Appearance: Normal appearance. She is well-developed.  HENT:     Head: Normocephalic and atraumatic.     Right Ear: Tympanic membrane and external ear normal.     Left Ear:  Tympanic membrane and external ear normal.     Nose: Nose normal.     Mouth/Throat:     Mouth: Mucous membranes are moist.     Pharynx: Oropharynx is clear.  Eyes:     Conjunctiva/sclera: Conjunctivae normal.  Cardiovascular:     Rate and Rhythm: Normal rate and regular rhythm.     Heart sounds: Normal heart sounds. No murmur heard. Pulmonary:     Effort: Pulmonary effort is normal.     Breath sounds: Normal breath sounds. No wheezing, rhonchi or rales.  Musculoskeletal:     Cervical back: Neck supple.  Skin:    General: Skin is warm.     Findings:  No rash.  Neurological:     Mental Status: She is alert and oriented to person, place, and time.  Psychiatric:        Behavior: Behavior normal.  Previous notes and tests were reviewed. The plan was reviewed with the patient/family, and all questions/concerned were addressed.  It was my pleasure to see Claire Hill today and participate in her care. Please feel free to contact me with any questions or concerns.  Sincerely,  Wyline Mood, DO Allergy & Immunology  [facility] office: [phone] [facility] office: [phone]

## 2023-07-24 ENCOUNTER — Ambulatory Visit: Payer: Commercial Managed Care - HMO | Admitting: Allergy

## 2023-07-24 DIAGNOSIS — J301 Allergic rhinitis due to pollen: Secondary | ICD-10-CM

## 2023-07-24 DIAGNOSIS — H1013 Acute atopic conjunctivitis, bilateral: Secondary | ICD-10-CM

## 2023-07-24 DIAGNOSIS — L2089 Other atopic dermatitis: Secondary | ICD-10-CM

## 2023-07-24 DIAGNOSIS — J309 Allergic rhinitis, unspecified: Secondary | ICD-10-CM

## 2023-07-24 DIAGNOSIS — J3081 Allergic rhinitis due to animal (cat) (dog) hair and dander: Secondary | ICD-10-CM

## 2023-07-24 DIAGNOSIS — J3089 Other allergic rhinitis: Secondary | ICD-10-CM

## 2023-07-25 ENCOUNTER — Other Ambulatory Visit (HOSPITAL_COMMUNITY): Payer: Self-pay

## 2023-07-25 ENCOUNTER — Telehealth: Payer: Self-pay

## 2023-07-25 NOTE — Telephone Encounter (Signed)
Pharmacy Patient Advocate Encounter   Received notification from CoverMyMeds that prior authorization for Rybelsus 14mg  is required/requested.   Per test claim: PA required; PA submitted to [employer] via CoverMyMeds Key/confirmation #/EOC [facility] Status is pending

## 2023-07-26 NOTE — Telephone Encounter (Signed)
Pharmacy Patient Advocate Encounter  Received notification from [employer] that Prior Authorization for Rybelsus 14mg  has been DENIED.  Full denial letter will be uploaded to the media tab. See denial reason below.  There is no clinical information indicating your patient has met both of the following: A) Individual  will continue maximally tolerated metformin therapy, if not contraindicated per FDA label, intolerant,  or otherwise not a candidate -and- B) Documentation of one of the following: 1. Unable to achieve  goal HbA1C despite 90 consecutive days of metformin or metformin-containing regimen  (meglitinides, sulfonylureas, or thiazolidinediones) at greater than or equal to 1,500 mg per day; 2.  Intolerance to metformin 1,500 mg per day despite appropriate dose titration duration (for example,  period of 8-12 weeks); 3. Contraindication to metformin per FDA label (for example, acute/chronic  metabolic acidosis, severe renal dysfunction); 4. Not a candidate for metformin (for example,  hepatic impairment, moderate renal dysfunction, unstable heart failure). For individuals able to inject (for example using insulin), Rybelsus is considered medically  necessary when the following criteria are met: Documented contraindication per FDA label,  [facility] on behalf of [address], [employer]. intolerance, or inability to use all of the following: (A) Bydureon, Bydureon BCise, OR Byetta (B)  Trulicity   PA #/Case ID/Reference #: [id number]

## 2023-07-28 ENCOUNTER — Other Ambulatory Visit: Payer: Self-pay | Admitting: Family Medicine

## 2023-07-28 DIAGNOSIS — Z1211 Encounter for screening for malignant neoplasm of colon: Secondary | ICD-10-CM

## 2023-07-28 DIAGNOSIS — Z1212 Encounter for screening for malignant neoplasm of rectum: Secondary | ICD-10-CM

## 2023-07-28 MED ORDER — TRULICITY 1.5 MG/0.5ML ~~LOC~~ SOAJ: 1.5000 mg | SUBCUTANEOUS | 3 refills | Status: AC

## 2023-07-28 NOTE — Telephone Encounter (Signed)
Patient aware and prescription sent to [facility] as requested

## 2023-07-28 NOTE — Addendum Note (Signed)
Addended by: Scarlette Shorts on: 07/28/2023 09:11 AM   Modules accepted: Orders

## 2023-07-28 NOTE — Addendum Note (Signed)
Addended by: Lisabeth Pick on: 07/28/2023 12:47 PM   Modules accepted: Orders

## 2023-08-01 ENCOUNTER — Other Ambulatory Visit: Payer: Self-pay

## 2023-08-01 DIAGNOSIS — E119 Type 2 diabetes mellitus without complications: Secondary | ICD-10-CM

## 2023-08-01 MED ORDER — PEN NEEDLES 32G X 4 MM MISC
3 refills | Status: DC
Start: 2023-08-01 — End: 2024-01-17

## 2023-08-03 ENCOUNTER — Other Ambulatory Visit: Payer: Self-pay

## 2023-08-03 MED ORDER — BASAGLAR KWIKPEN 100 UNIT/ML ~~LOC~~ SOPN: 16.0000 [IU] | PEN_INJECTOR | Freq: Every day | SUBCUTANEOUS | 4 refills | Status: DC

## 2023-08-03 MED ORDER — LEVOCETIRIZINE DIHYDROCHLORIDE 5 MG PO TABS: 5.0000 mg | ORAL_TABLET | Freq: Every evening | ORAL | 0 refills | Status: AC

## 2023-08-03 MED ORDER — ATORVASTATIN CALCIUM 10 MG PO TABS: 10.0000 mg | ORAL_TABLET | Freq: Every day | ORAL | 3 refills | Status: AC

## 2023-08-07 LAB — COLOGUARD

## 2023-08-22 ENCOUNTER — Telehealth: Payer: Self-pay

## 2023-08-22 ENCOUNTER — Other Ambulatory Visit (HOSPITAL_COMMUNITY): Payer: Self-pay

## 2023-08-22 NOTE — Telephone Encounter (Signed)
Pharmacy Patient Advocate Encounter   Received notification from CoverMyMeds that prior authorization for Trulicity is required/requested.   Insurance verification completed.   The patient is insured through [employer] .   Per test claim: PA required; PA submitted to above mentioned insurance via CoverMyMeds Key/confirmation #/EOC [id number] Status is pending

## 2023-08-25 NOTE — Telephone Encounter (Signed)
Pharmacy Patient Advocate Encounter  Received notification from [employer] that Prior Authorization for Trulicity has been APPROVED through 08/20/2024   PA #/Case ID/Reference #: [id number]

## 2023-08-28 NOTE — Telephone Encounter (Signed)
Pharmacy aware

## 2023-09-11 ENCOUNTER — Other Ambulatory Visit: Payer: Self-pay | Admitting: Allergy

## 2023-09-13 ENCOUNTER — Other Ambulatory Visit: Payer: Self-pay | Admitting: Allergy

## 2023-09-28 ENCOUNTER — Telehealth: Payer: Self-pay | Admitting: Internal Medicine

## 2023-09-29 ENCOUNTER — Ambulatory Visit: Payer: Commercial Managed Care - HMO | Admitting: Medical

## 2023-10-02 ENCOUNTER — Ambulatory Visit: Payer: Commercial Managed Care - HMO | Admitting: Medical

## 2023-10-02 ENCOUNTER — Telehealth: Payer: Self-pay | Admitting: Allergy

## 2023-10-02 NOTE — Telephone Encounter (Signed)
Patient called and stated that she needs a refill on her Montelukast. Pharmacy is [facility] at [address] In [place]. Patients call back number is [phone]

## 2023-10-02 NOTE — Telephone Encounter (Signed)
Patient last seen: 01/16/23 return 6 mths - no show 07/24/23.  Patient can obtain medication refill @ 10/11/23 @ 4 pm appt w/Dr. Selena Batten.

## 2023-10-11 ENCOUNTER — Ambulatory Visit: Payer: Self-pay | Admitting: Allergy

## 2023-10-19 ENCOUNTER — Ambulatory Visit: Payer: Self-pay | Admitting: Family Medicine

## 2023-10-19 NOTE — Telephone Encounter (Signed)
FYI

## 2023-10-19 NOTE — Telephone Encounter (Signed)
Copied from CRM [id number]. Topic: Clinical - Red Word Triage >> Oct 19, 2023  1:16 PM Irine Seal wrote: [facility] that prompted transfer to Nurse Triage: extreme pain in arm    Chief Complaint: Pain Symptoms: Pain in left arm/shoulder,elbow and lower back Frequency: Ongoing for 3 weeks Pertinent Negatives: Patient denies chest pain Disposition: [] ED /[] Urgent Care (no appt availability in office) / [x] Appointment(In office/virtual)/ []  [facility]/ [] Home Care/ [] Refused Recommended Disposition /[] [facility]/ []  Follow-up with PCP  Additional Notes: Patient declined when this RN offered to get an interpreter to assist during this phone call. Patient stated that she has been having pain in her left arm, shoulder, and elbow for the past 3 weeks. She also has lower back pain. She stated her pain level is currently 1/10 at rest. The pain worsens with physical activity (when she is cleaning or showering for example). She has been trying Tylenol for pain relief, but the pain persists. This RN recommended for patient to be seen in next 3 days. Appointment was offered for tomorrow and patient declined. Patient requested appointment on January 3rd because she has transportation on that day. Appointment scheduled as requested.   Reason for Disposition  [1] MODERATE pain (e.g., interferes with normal activities) AND [2] present > 3 days  Answer Assessment - Initial Assessment Questions 1. ONSET: "When did the pain start?"     3 weeks ago  2. LOCATION: "Where is the pain located?"     Left arm, shoulder, elbow, and lower back  3. PAIN: "How bad is the pain?" (Scale 1-10; or mild, moderate, severe)   - MILD (1-3): Doesn't interfere with normal activities.   - MODERATE (4-7): Interferes with normal activities (e.g., work or school) or awakens from sleep.   - SEVERE (8-10): Excruciating pain, unable to do any normal activities, unable to hold a cup of water.     1/10, worsens with  activity  4. CAUSE: "What do you think is causing the arm pain?"     Unknown/ did not understand patient's response  5. OTHER SYMPTOMS: "Do you have any other symptoms?" (e.g., neck pain, swelling, rash, fever, numbness, weakness)     No  Protocols used: Arm Pain-A-AH

## 2023-10-27 ENCOUNTER — Ambulatory Visit: Payer: BLUE CROSS/BLUE SHIELD | Admitting: Family Medicine

## 2023-10-27 ENCOUNTER — Encounter: Payer: Self-pay | Admitting: Family Medicine

## 2023-10-27 VITALS — BP 112/78 | HR 109 | Temp 98.0°F | Resp 16 | Ht 68.0 in | Wt 164.8 lb

## 2023-10-27 DIAGNOSIS — M25511 Pain in right shoulder: Secondary | ICD-10-CM

## 2023-10-27 MED ORDER — MELOXICAM 15 MG PO TABS
15.0000 mg | ORAL_TABLET | Freq: Every day | ORAL | 0 refills | Status: AC
Start: 2023-10-27 — End: ?

## 2023-10-27 NOTE — Patient Instructions (Signed)
 Heat (pad or rice pillow in microwave) over affected area, 10-15 minutes twice daily.   Ice/cold pack over area for 10-15 min twice daily.  OK to take Tylenol  1000 mg (2 extra strength tabs) or 975 mg (3 regular strength tabs) every 6 hours as needed.  Send me a message in 1 mo if no better.   Let us  know if you need anything.  EXERCISES  RANGE OF MOTION (ROM) AND STRETCHING EXERCISES These exercises may help you when beginning to rehabilitate your injury. While completing these exercises, remember:  Restoring tissue flexibility helps normal motion to return to the joints. This allows healthier, less painful movement and activity. An effective stretch should be held for at least 30 seconds. A stretch should never be painful. You should only feel a gentle lengthening or release in the stretched tissue.  ROM - Pendulum Bend at the waist so that your right / left arm falls away from your body. Support yourself with your opposite hand on a solid surface, such as a table or a countertop. Your right / left arm should be perpendicular to the ground. If it is not perpendicular, you need to lean over farther. Relax the muscles in your right / left arm and shoulder as much as possible. Gently sway your hips and trunk so they move your right / left arm without any use of your right / left shoulder muscles. Progress your movements so that your right / left arm moves side to side, then forward and backward, and finally, both clockwise and counterclockwise. Complete 10-15 repetitions in each direction. Many people use this exercise to relieve discomfort in their shoulder as well as to gain range of motion. Repeat 2 times. Complete this exercise 3 times per week.  STRETCH - Flexion, Standing Stand with good posture. With an underhand grip on your right / left hand and an overhand grip on the opposite hand, grasp a broomstick or cane so that your hands are a little more than shoulder-width apart. Keeping  your right / left elbow straight and shoulder muscles relaxed, push the stick with your opposite hand to raise your right / left arm in front of your body and then overhead. Raise your arm until you feel a stretch in your right / left shoulder, but before you have increased shoulder pain. Try to avoid shrugging your right / left shoulder as your arm rises by keeping your shoulder blade tucked down and toward your mid-back spine. Hold 30 seconds. Slowly return to the starting position. Repeat 2 times. Complete this exercise 3 times per week.  STRETCH - Internal Rotation Place your right / left hand behind your back, palm-up. Throw a towel or belt over your opposite shoulder. Grasp the towel/belt with your right / left hand. While keeping an upright posture, gently pull up on the towel/belt until you feel a stretch in the front of your right / left shoulder. Avoid shrugging your right / left shoulder as your arm rises by keeping your shoulder blade tucked down and toward your mid-back spine. Hold 30. Release the stretch by lowering your opposite hand. Repeat 2 times. Complete this exercise 3 times per week.  STRETCH - External Rotation and Abduction Stagger your stance through a doorframe. It does not matter which foot is forward. As instructed by your physician, physical therapist or athletic trainer, place your hands: And forearms above your head and on the door frame. And forearms at head-height and on the door frame. At elbow-height and  on the door frame. Keeping your head and chest upright and your stomach muscles tight to prevent over-extending your low-back, slowly shift your weight onto your front foot until you feel a stretch across your chest and/or in the front of your shoulders. Hold 30 seconds. Shift your weight to your back foot to release the stretch. Repeat 2 times. Complete this stretch 3 times per week.   STRENGTHENING EXERCISES  These exercises may help you when beginning to  rehabilitate your injury. They may resolve your symptoms with or without further involvement from your physician, physical therapist or athletic trainer. While completing these exercises, remember:  Muscles can gain both the endurance and the strength needed for everyday activities through controlled exercises. Complete these exercises as instructed by your physician, physical therapist or athletic trainer. Progress the resistance and repetitions only as guided. You may experience muscle soreness or fatigue, but the pain or discomfort you are trying to eliminate should never worsen during these exercises. If this pain does worsen, stop and make certain you are following the directions exactly. If the pain is still present after adjustments, discontinue the exercise until you can discuss the trouble with your clinician. If advised by your physician, during your recovery, avoid activity or exercises which involve actions that place your right / left hand or elbow above your head or behind your back or head. These positions stress the tissues which are trying to heal.  STRENGTH - Scapular Depression and Adduction With good posture, sit on a firm chair. Supported your arms in front of you with pillows, arm rests or a table top. Have your elbows in line with the sides of your body. Gently draw your shoulder blades down and toward your mid-back spine. Gradually increase the tension without tensing the muscles along the top of your shoulders and the back of your neck. Hold for 3 seconds. Slowly release the tension and relax your muscles completely before completing the next repetition. After you have practiced this exercise, remove the arm support and complete it in standing as well as sitting. Repeat 2 times. Complete this exercise 3 times per week.   STRENGTH - External Rotators Secure a rubber exercise band/tubing to a fixed object so that it is at the same height as your right / left elbow when you are  standing or sitting on a firm surface. Stand or sit so that the secured exercise band/tubing is at your side that is not injured. Bend your elbow 90 degrees. Place a folded towel or small pillow under your right / left arm so that your elbow is a few inches away from your side. Keeping the tension on the exercise band/tubing, pull it away from your body, as if pivoting on your elbow. Be sure to keep your body steady so that the movement is only coming from your shoulder rotating. Hold 3 seconds. Release the tension in a controlled manner as you return to the starting position. Repeat 2 times. Complete this exercise 3 times per week.   STRENGTH - Supraspinatus Stand or sit with good posture. Grasp a 2-3 lb weight or an exercise band/tubing so that your hand is thumbs-up, like when you shake hands. Slowly lift your right / left hand from your thigh into the air, traveling about 30 degrees from straight out at your side. Lift your hand to shoulder height or as far as you can without increasing any shoulder pain. Initially, many people do not lift their hands above shoulder height. Avoid shrugging  your right / left shoulder as your arm rises by keeping your shoulder blade tucked down and toward your mid-back spine. Hold for 3 seconds. Control the descent of your hand as you slowly return to your starting position. Repeat 2 times. Complete this exercise 3 times per week.   STRENGTH - Shoulder Extensors Secure a rubber exercise band/tubing so that it is at the height of your shoulders when you are either standing or sitting on a firm arm-less chair. With a thumbs-up grip, grasp an end of the band/tubing in each hand. Straighten your elbows and lift your hands straight in front of you at shoulder height. Step back away from the secured end of band/tubing until it becomes tense. Squeezing your shoulder blades together, pull your hands down to the sides of your thighs. Do not allow your hands to go behind  you. Hold for 3 seconds. Slowly ease the tension on the band/tubing as you reverse the directions and return to the starting position. Repeat 2 times. Complete this exercise 3 times per week.   STRENGTH - Scapular Retractors Secure a rubber exercise band/tubing so that it is at the height of your shoulders when you are either standing or sitting on a firm arm-less chair. With a palm-down grip, grasp an end of the band/tubing in each hand. Straighten your elbows and lift your hands straight in front of you at shoulder height. Step back away from the secured end of band/tubing until it becomes tense. Squeezing your shoulder blades together, draw your elbows back as you bend them. Keep your upper arm lifted away from your body throughout the exercise. Hold 3 seconds. Slowly ease the tension on the band/tubing as you reverse the directions and return to the starting position. Repeat 2 times. Complete this exercise 3 times per week.  STRENGTH - Scapular Depressors Find a sturdy chair without wheels, such as a from a dining room table. Keeping your feet on the floor, lift your bottom from the seat and lock your elbows. Keeping your elbows straight, allow gravity to pull your body weight down. Your shoulders will rise toward your ears. Raise your body against gravity by drawing your shoulder blades down your back, shortening the distance between your shoulders and ears. Although your feet should always maintain contact with the floor, your feet should progressively support less body weight as you get stronger. Hold 3 seconds. In a controlled and slow manner, lower your body weight to begin the next repetition. Repeat 2 times. Complete this exercise 3 times per week.    This information is not intended to replace advice given to you by your health care provider. Make sure you discuss any questions you have with your health care provider.   Document Released: 08/24/2005 Document Revised: 10/31/2014  Document Reviewed: 01/22/2009 Elsevier Interactive Patient Education [employer]

## 2023-10-27 NOTE — Progress Notes (Signed)
 Musculoskeletal Exam  Patient: Claire Hill DOB: [birth year]  DOS: 10/27/2023  SUBJECTIVE:  Chief Complaint:   Chief Complaint  Patient presents with   Shoulder Pain    Left shoulder pain     [name] V Heathcock is a 53 y.o.  female for evaluation and treatment of L shoulder pain.   Onset:  1 week ago. No inj or change in activity.  Location: L shoulder/upper L back Character:  sharp  Progression of issue:  is unchanged Associated symptoms: decreased ROM 2/2 pain No bruising, redness, swelling Treatment: to date has been acetaminophen , heat and Voltaren  gel.   Neurovascular symptoms: no  Past Medical History:  Diagnosis Date   Allergy     Anemia    Asthma    pt states no-no inhalers   COPD (chronic obstructive pulmonary disease) (HCC)    Diabetes mellitus    Diabetes mellitus without complication (HCC)    Glaucoma    Herpes    Right eye   Herpes simplex of eye    Hyperlipidemia    OCD (obsessive compulsive disorder)     Objective: VITAL SIGNS: BP 112/78   Pulse (!) 109   Temp 98 F (36.7 C) (Oral)   Resp 16   Ht 5' 8 (1.727 m)   Wt 164 lb 12.8 oz (74.8 kg)   LMP  (LMP Unknown) Comment: patient could not remember last period 05/15/17  SpO2 98%   BMI 25.06 kg/m  Constitutional: Well formed, well developed. No acute distress. Thorax & Lungs: No accessory muscle use Musculoskeletal: L shoulder.   Normal active range of motion: no.   Normal passive range of motion: no Tenderness to palpation: no Deformity: no Ecchymosis: no Tests positive: Neer's, hawkins, empty can Tests negative: internal rotation, speed's, cross over, obriens equiv Neurologic: Normal sensory function. No focal deficits noted. DTR's equal and symmetric in UE's. No clonus.  Psychiatric: Normal mood. Age appropriate judgment and insight. Alert & oriented x 3.    Assessment:  Acute pain of right shoulder - Plan: meloxicam  (MOBIC ) 15 MG tablet  Plan: Stretches/exercises, heat,  ice, Tylenol . Send message in 3-4 weeks if no better.  F/u as originally scheduled. The patient voiced understanding and agreement to the plan.   Mabel [place], DO 10/27/23  2:08 PM

## 2023-10-29 NOTE — Progress Notes (Deleted)
 Follow Up Note  RE: Claire Hill MRN: [id number] DOB: 06-Sep-1971 Date of Office Visit: 10/30/2023  Referring provider: Antonio Cyndee Jamee JONELLE, * Primary care provider: Antonio Cyndee, Jamee JONELLE, DO  Chief Complaint: No chief complaint on file.  History of Present Illness: I had the pleasure of seeing Claire Hill for a follow up visit at the [facility] on 10/29/2023. She is a 53 y.o. female, who is being followed for allergic rhinoconjunctivitis and atopic dermatitis. Her previous allergy  office visit was on 12/27/2022 with Dr. Luke. Today is a regular follow up visit.  Discussed the use of AI scribe software for clinical note transcription with the patient, who gave verbal consent to proceed.  History of Present Illness            ***  Assessment and Plan: Claire Hill is a 53 y.o. female with: Seasonal allergic rhinitis due to pollen Allergic rhinitis due to dust mite Allergic rhinitis due to animal dander Allergic conjunctivitis of both eyes Past history - 2020 skin testing positive to grass pollen, weed, ragweed, dust mites and cat.  Discussed AIT with interpreter services - not interested. Interim history - not interested in AIT. Some ear fullness.  Continue environmental control measures. May take levoceterizine 5mg  daily for allergies.  Continue Singulair  10mg  daily at night. Use azelastine  nasal spray 1-2 sprays per nostril twice a day as needed for runny nose/drainage. Use Flonase  (fluticasone ) nasal spray 1 spray per nostril twice a day as needed for nasal congestion.  Nasal saline spray (i.e., Simply Saline) or nasal saline lavage (i.e., NeilMed) is recommended as needed and prior to medicated nasal sprays. Use Optivar  (azelastine ) eye drops 0.05% twice a day as needed for itchy/watery eyes.   Other atopic dermatitis Skin flared after using a new body wash.  Continue proper skin care measures.  Use Eucrisa  (crisaborole ) 2% ointment  twice a day on mild rash flares on the face and body. This is a non-steroid ointment. Samples given. Assessment and Plan              No follow-ups on file.  No orders of the defined types were placed in this encounter.  Lab Orders  No laboratory test(s) ordered today    Diagnostics: Spirometry:  Tracings reviewed. Her effort: {Blank single:19197::Good reproducible efforts.,It was hard to get consistent efforts and there is a question as to whether this reflects a maximal maneuver.,Poor effort, data can not be interpreted.} FVC: ***L FEV1: ***L, ***% predicted FEV1/FVC ratio: ***% Interpretation: {Blank single:19197::Spirometry consistent with mild obstructive disease,Spirometry consistent with moderate obstructive disease,Spirometry consistent with severe obstructive disease,Spirometry consistent with possible restrictive disease,Spirometry consistent with mixed obstructive and restrictive disease,Spirometry uninterpretable due to technique,Spirometry consistent with normal pattern,No overt abnormalities noted given today's efforts}.  Please see scanned spirometry results for details.  Skin Testing: {Blank single:19197::Select foods,Environmental allergy  panel,Environmental allergy  panel and select foods,Food allergy  panel,None,Deferred due to recent antihistamines use}. *** Results discussed with patient/family.   Medication List:  Current Outpatient Medications  Medication Sig Dispense Refill  . atorvastatin  (LIPITOR) 10 MG tablet Take 1 tablet (10 mg total) by mouth daily. 90 tablet 3  . azelastine  (ASTELIN ) 0.1 % nasal spray Take 1-2 sprays twice a day as needed for runny nose. 30 mL 5  . azelastine  (OPTIVAR ) 0.05 % ophthalmic solution Place 1 drop into both eyes 2 (two) times daily as needed (itchy/watery eyes). 6 mL 3  . blood glucose meter kit and supplies  KIT Use a directed twice a day.  E11.8.  Use Relion Meter 1 each 0  . blood  glucose meter kit and supplies KIT Dispense based on patient and insurance preference. Use up to four times daily as directed. (FOR ICD-9 250.00, 250.01). 1 each 0  . blood glucose meter kit and supplies KIT Dispense based on patient and insurance preference. Use up to four times daily as directed. (FOR ICD-9 250.00, 250.01). 1 each 0  . Blood Glucose Monitoring Suppl (ONETOUCH VERIO) w/Device KIT Use to check blood sugar up to 4 times a day 1 kit 0  . Crisaborole  (EUCRISA ) 2 % OINT Apply 1 Application topically 2 (two) times daily as needed (mild rash). 60 g 5  . Dulaglutide  (TRULICITY ) 1.5 MG/0.5ML SOPN Inject 1.5 mg into the skin once a week. 6 mL 3  . fluticasone  (FLONASE ) 50 MCG/ACT nasal spray Place 1 spray into both nostrils 2 (two) times daily as needed (nasal congestion). 16 g 5  . glucose blood (ONETOUCH ULTRA) test strip USE TO CHECK BLOOD SUGAR UP TO 4 TIMES A DAY AS DIRECTED 300 strip 1  . Insulin  Glargine (BASAGLAR  KWIKPEN) 100 UNIT/ML Inject 16 Units into the skin daily. 15 mL 4  . Insulin  Pen Needle (PEN NEEDLES) 32G X 4 MM MISC Use As Directed 100 each 3  . Lancets (ONETOUCH DELICA PLUS LANCET33G) MISC USE TO CHECK BLOOD SUGAR UP TO FOUR TIMES A DAY 300 each 1  . levocetirizine (XYZAL ) 5 MG tablet Take 1 tablet (5 mg total) by mouth every evening. 30 tablet 0  . meloxicam  (MOBIC ) 15 MG tablet Take 1 tablet (15 mg total) by mouth daily. 21 tablet 0  . montelukast  (SINGULAIR ) 10 MG tablet Take 1 tablet (10 mg total) by mouth at bedtime. 90 tablet 3  . Multiple Vitamins-Minerals (ICAPS) TABS Take by mouth.    . Semaglutide  (RYBELSUS ) 14 MG TABS Take 1 tablet (14 mg total) by mouth daily. 90 tablet 3  . valACYclovir (VALTREX) 1000 MG tablet Take 1,000 mg by mouth daily.     No current facility-administered medications for this visit.   Allergies: No Known Allergies I reviewed her past medical history, social history, family history, and environmental history and no significant  changes have been reported from her previous visit.  Review of Systems  Constitutional:  Negative for appetite change, chills, fever and unexpected weight change.  HENT:  Negative for congestion, rhinorrhea and sneezing.   Eyes:  Negative for redness and itching.  Respiratory:  Negative for cough, chest tightness, shortness of breath and wheezing.   Cardiovascular:  Negative for chest pain.  Gastrointestinal:  Negative for abdominal pain.  Genitourinary:  Negative for difficulty urinating.  Skin:  Negative for rash.  Allergic/Immunologic: Positive for environmental allergies. Negative for food allergies.  Neurological:  Negative for headaches.   Objective: LMP  (LMP Unknown) Comment: patient could not remember last period 05/15/17 There is no height or weight on file to calculate BMI. Physical Exam Vitals and nursing note reviewed.  Constitutional:      Appearance: Normal appearance. She is well-developed.  HENT:     Head: Normocephalic and atraumatic.     Right Ear: Tympanic membrane and external ear normal.     Left Ear: Tympanic membrane and external ear normal.     Nose: Nose normal.     Mouth/Throat:     Mouth: Mucous membranes are moist.     Pharynx: Oropharynx is clear.  Eyes:  Conjunctiva/sclera: Conjunctivae normal.  Cardiovascular:     Rate and Rhythm: Normal rate and regular rhythm.     Heart sounds: Normal heart sounds. No murmur heard. Pulmonary:     Effort: Pulmonary effort is normal.     Breath sounds: Normal breath sounds. No wheezing, rhonchi or rales.  Musculoskeletal:     Cervical back: Neck supple.  Skin:    General: Skin is warm.     Findings: No rash.  Neurological:     Mental Status: She is alert and oriented to person, place, and time.  Psychiatric:        Behavior: Behavior normal.  Previous notes and tests were reviewed. The plan was reviewed with the patient/family, and all questions/concerned were addressed.  It was my pleasure to see  Claire Hill today and participate in her care. Please feel free to contact me with any questions or concerns.  Sincerely,  Orlan Cramp, DO Allergy  & Immunology  [facility]  [place] office: [phone] [facility] office: [phone]

## 2023-10-30 ENCOUNTER — Ambulatory Visit: Payer: Self-pay | Admitting: Allergy

## 2023-10-30 DIAGNOSIS — L2089 Other atopic dermatitis: Secondary | ICD-10-CM

## 2023-10-30 DIAGNOSIS — H1013 Acute atopic conjunctivitis, bilateral: Secondary | ICD-10-CM

## 2023-10-30 DIAGNOSIS — J3081 Allergic rhinitis due to animal (cat) (dog) hair and dander: Secondary | ICD-10-CM

## 2023-10-30 DIAGNOSIS — J3089 Other allergic rhinitis: Secondary | ICD-10-CM

## 2023-10-30 DIAGNOSIS — J301 Allergic rhinitis due to pollen: Secondary | ICD-10-CM

## 2023-11-14 ENCOUNTER — Encounter: Payer: Self-pay | Admitting: Internal Medicine

## 2023-11-14 ENCOUNTER — Ambulatory Visit: Payer: Commercial Managed Care - HMO | Admitting: Internal Medicine

## 2023-11-14 NOTE — Progress Notes (Deleted)
Name: Claire Hill  Age/ Sex: 53 y.o., female   MRN/ DOB: [id number], Jun 15, 1971     PCP: Donato Schultz, DO   Reason for Endocrinology Evaluation: Type 2 Diabetes Mellitus  Initial Endocrine Consultative Visit: 04/12/2013    PATIENT IDENTIFIER: Claire Hill is a 53 y.o. female with a past medical history of Htn, DM and Asthma . The patient has followed with Endocrinology clinic since 04/12/2013 for consultative assistance with management of her diabetes.  DIABETIC HISTORY:  [name] was diagnosed with DM 2012, and started insulin in 2022. Her hemoglobin A1c has ranged from 5.7% in 2018, peaking at 10.7% in 2023.   The patient was followed by Dr. Everardo All from 2014 until 12/2021  On her initial visit with me 11/2022 she had an A1c of 12.5%, she was on Rybelsus and Semglee which we adjusted  Switch Basaglar to Trulicity 08/2023  I also started her on atorvastatin 11/2022 with an LDL of 161 mg/DL   SUBJECTIVE:   During the last visit (06/27/2023): A1c 14.0%    Today (11/14/2023): Claire Hill is here for follow-up on diabetes management.  Patient states she checks her blood sugars  daily.  Glucose meter today had 1 reading of 359 mg/DL  Interpreter line was used  Denies nausea, or vomiting  Denies constipation or diarrhea   She had an episode approximately 2 weeks ago with her pressure and drainage, took aspirin, symptoms resolved  HOME DIABETES REGIMEN:  Trulicity 1.5 mg weekly Basaglar 16 units daily Atorvastatin 10 mg daily    Statin: no ACE-I/ARB: no    METER DOWNLOAD SUMMARY: unable to download  1 reading of 359 mg/dL    DIABETIC COMPLICATIONS: Microvascular complications:   Denies: CKD Last Eye Exam: Completed 2020  Macrovascular complications:   Denies: CAD, CVA, PVD   HISTORY:  Past Medical History:  Past Medical History:  Diagnosis Date   Allergy    Anemia    Asthma    pt states no-no inhalers   COPD (chronic  obstructive pulmonary disease) (HCC)    Diabetes mellitus    Diabetes mellitus without complication (HCC)    Glaucoma    Herpes    Right eye   Herpes simplex of eye    Hyperlipidemia    OCD (obsessive compulsive disorder)    Past Surgical History:  Past Surgical History:  Procedure Laterality Date   MASS EXCISION  06/22/2012   Procedure: EXCISION MASS;  Surgeon: Shelly Rubenstein, MD;  Location: [facility];  Service: General;  Laterality: Right;  Excision chronic Right axillary cyst   NO PAST SURGERIES     Social History:  reports that she has never smoked. She has never used smokeless tobacco. She reports that she does not drink alcohol and does not use drugs. Family History:  Family History  Problem Relation Age of Onset   Diabetes Father    Hypertension Mother    Diabetes Paternal Grandmother    Cancer Other        LUNG, BRAIN AND STOMACH    Cancer Cousin        ovarian     HOME MEDICATIONS: Allergies as of 11/14/2023   No Known Allergies      Medication List        Accurate as of November 14, 2023  7:05 AM. If you have any questions, ask your nurse or doctor.          atorvastatin 10 MG tablet  Commonly known as: LIPITOR Take 1 tablet (10 mg total) by mouth daily.   azelastine 0.05 % ophthalmic solution Commonly known as: OPTIVAR Place 1 drop into both eyes 2 (two) times daily as needed (itchy/watery eyes).   azelastine 0.1 % nasal spray Commonly known as: ASTELIN Take 1-2 sprays twice a day as needed for runny nose.   Basaglar KwikPen 100 UNIT/ML Inject 16 Units into the skin daily.   blood glucose meter kit and supplies Kit Use a directed twice a day.  E11.8.  Use Relion Meter   blood glucose meter kit and supplies Kit Dispense based on patient and insurance preference. Use up to four times daily as directed. (FOR ICD-9 250.00, 250.01).   blood glucose meter kit and supplies Kit Dispense based on patient and insurance preference.  Use up to four times daily as directed. (FOR ICD-9 250.00, 250.01).   Eucrisa 2 % Oint Generic drug: Crisaborole Apply 1 Application topically 2 (two) times daily as needed (mild rash).   fluticasone 50 MCG/ACT nasal spray Commonly known as: FLONASE Place 1 spray into both nostrils 2 (two) times daily as needed (nasal congestion).   ICAPS Tabs Take by mouth.   levocetirizine 5 MG tablet Commonly known as: Xyzal Take 1 tablet (5 mg total) by mouth every evening.   meloxicam 15 MG tablet Commonly known as: MOBIC Take 1 tablet (15 mg total) by mouth daily.   montelukast 10 MG tablet Commonly known as: Singulair Take 1 tablet (10 mg total) by mouth at bedtime.   OneTouch Delica Plus Lancet33G Misc USE TO CHECK BLOOD SUGAR UP TO FOUR TIMES A DAY   OneTouch Ultra test strip Generic drug: glucose blood USE TO CHECK BLOOD SUGAR UP TO 4 TIMES A DAY AS DIRECTED   OneTouch Verio w/Device Kit Use to check blood sugar up to 4 times a day   Pen Needles 32G X 4 MM Misc Use As Directed   Rybelsus 14 MG Tabs Generic drug: Semaglutide Take 1 tablet (14 mg total) by mouth daily.   Trulicity 1.5 MG/0.5ML Soaj Generic drug: Dulaglutide Inject 1.5 mg into the skin once a week.   valACYclovir 1000 MG tablet Commonly known as: VALTREX Take 1,000 mg by mouth daily.         OBJECTIVE:   Vital Signs: LMP  (LMP Unknown) Comment: patient could not remember last period 05/15/17  Wt Readings from Last 3 Encounters:  10/27/23 164 lb 12.8 oz (74.8 kg)  06/27/23 161 lb (73 kg)  01/16/23 160 lb 3.2 oz (72.7 kg)     Exam: General: Pt appears well and is in NAD  Lungs: Clear with good BS bilat   Heart: RRR   Abdomen:  soft, nontender  Extremities: No pretibial edema.   Neuro: MS is good with appropriate affect, pt is alert and Ox3     DATA REVIEWED:  Lab Results  Component Value Date   HGBA1C 14.0 (A) 06/27/2023   HGBA1C 12.5 (A) 12/20/2022   HGBA1C 8.6 (A) 01/20/2022      ASSESSMENT / PLAN / RECOMMENDATIONS:   1) Type 2 Diabetes Mellitus, Poorly controlled, With neuropathic complications - Most recent A1c of 14.0 %. Goal A1c < 7.0 %.    -Patient continues with poorly controlled diabetes, I suspect due to medication nonadherence. -Somehow she has been taking insulin 1 unit a day?.  Patient claims that her BG's running good at times at 125 mg/DL, and manual reviewing glucose meter there was 1 reading  of 359 mg/DL -We emphasized the importance of following instructions, I will increase her insulin -My assistant verified proper use of insulin pen with the patient -We again emphasized the importance of optimizing glucose control to prevent microvascular complications to include blindness and ESRD -Patient to check glucose 3 times daily   MEDICATIONS: Continue  Rybelsus 14 mg daily Increase  Basaglar  to 16 units daily  EDUCATION / INSTRUCTIONS: BG monitoring instructions: Patient is instructed to check her blood sugars 3 times a day. Call [facility] if: BG persistently < 70  I reviewed the Rule of 15 for the treatment of hypoglycemia in detail with the patient. Literature supplied.    2) Diabetic complications:  Eye: Does not have known diabetic retinopathy.  Neuro/ Feet: Does  have known diabetic peripheral neuropathy .  Renal: Patient does not have known baseline CKD. She   is not on an ACEI/ARB at present.    3) Dyslipidemia :   -LDL above goal at 161 mg/DL -Started atorvastatin 06/6044 -She has been tolerating Atorvastatin , we again discussed cardiovascular benefits with statin use  Medication Continue atorvastatin 10 mg daily   F/U in 4 months     Signed electronically by: Lyndle Herrlich, MD  [facility]  [facility] [address] [place] [place], [place] [address] Phone: [phone] FAX: [phone]   CC: Donato Schultz, DO [address] [place] [address] Phone: [phone]  Fax: [phone]  Return to Endocrinology clinic as below: Future Appointments  Date Time Provider Department Center  11/14/2023 10:50 AM Amair Shrout, Konrad Dolores, MD [facility] None

## 2024-01-17 ENCOUNTER — Other Ambulatory Visit: Payer: Self-pay

## 2024-01-17 DIAGNOSIS — E119 Type 2 diabetes mellitus without complications: Secondary | ICD-10-CM

## 2024-01-17 MED ORDER — PEN NEEDLES 32G X 4 MM MISC: 0 refills | Status: AC

## 2024-07-29 ENCOUNTER — Other Ambulatory Visit: Payer: Self-pay

## 2024-07-29 MED ORDER — BASAGLAR KWIKPEN 100 UNIT/ML ~~LOC~~ SOPN: 16.0000 [IU] | PEN_INJECTOR | Freq: Every day | SUBCUTANEOUS | 0 refills | Status: DC

## 2024-10-12 ENCOUNTER — Other Ambulatory Visit: Payer: Self-pay | Admitting: Internal Medicine

## 2024-10-21 ENCOUNTER — Ambulatory Visit: Admitting: Internal Medicine

## 2024-10-21 NOTE — Progress Notes (Deleted)
 "   Name: Claire Hill  Age/ Sex: 53 y.o., female   MRN/ DOB: [id number], 01-24-1971     PCP: Antonio Cyndee Jamee JONELLE, DO   Reason for Endocrinology Evaluation: Type 2 Diabetes Mellitus  Initial Endocrine Consultative Visit: 04/12/2013    PATIENT IDENTIFIER: Claire Hill is a 53 y.o. female with a past medical history of Htn, DM and Asthma . The patient has followed with Endocrinology clinic since 04/12/2013 for consultative assistance with management of her diabetes.  DIABETIC HISTORY:  Claire Hill was diagnosed with DM 2012, and started insulin  in 2022. Her hemoglobin A1c has ranged from 5.7% in 2018, peaking at 10.7% in 2023.   The patient was followed by Dr. Kassie from 2014 until 12/2021  On her initial visit with me 11/2022 she had an A1c of 12.5%, she was on Rybelsus  and Semglee  which we adjusted   I also started her on atorvastatin  11/2022 with an LDL of 161 mg/DL   SUBJECTIVE:   During the last visit (12/20/2022): A1c 12.5%    Today (10/21/2024): Claire Hill is here for follow-up on diabetes management.  Patient states she checks her blood sugars  daily.  Glucose meter today had 1 reading of 359 mg/DL  Interpreter line was used  Denies nausea, or vomiting  Denies constipation or diarrhea   She had an episode approximately 2 weeks ago with her pressure and drainage, took aspirin , symptoms resolved  HOME DIABETES REGIMEN:  Rybelsus  14 mg daily Basaglar  16 units daily Atorvastatin  10 mg daily    Statin: no ACE-I/ARB: no    METER DOWNLOAD SUMMARY: unable to download  1 reading of 359 mg/dL    DIABETIC COMPLICATIONS: Microvascular complications:   Denies: CKD Last Eye Exam: Completed 2020  Macrovascular complications:   Denies: CAD, CVA, PVD   HISTORY:  Past Medical History:  Past Medical History:  Diagnosis Date   Allergy     Anemia    Asthma    pt states no-no inhalers   COPD (chronic obstructive pulmonary disease) (HCC)     Diabetes mellitus    Diabetes mellitus without complication (HCC)    Glaucoma    Herpes    Right eye   Herpes simplex of eye    Hyperlipidemia    OCD (obsessive compulsive disorder)    Past Surgical History:  Past Surgical History:  Procedure Laterality Date   MASS EXCISION  06/22/2012   Procedure: EXCISION MASS;  Surgeon: Vicenta DELENA Poli, MD;  Location: [facility];  Service: General;  Laterality: Right;  Excision chronic Right axillary cyst   NO PAST SURGERIES     Social History:  reports that she has never smoked. She has never used smokeless tobacco. She reports that she does not drink alcohol and does not use drugs. Family History:  Family History  Problem Relation Age of Onset   Diabetes Father    Hypertension Mother    Diabetes Paternal Grandmother    Cancer Other        LUNG, BRAIN AND STOMACH    Cancer Cousin        ovarian     HOME MEDICATIONS: Allergies as of 10/21/2024   No Known Allergies      Medication List        Accurate as of October 21, 2024  7:09 AM. If you have any questions, ask your nurse or doctor.          atorvastatin  10 MG tablet Commonly known  as: LIPITOR Take 1 tablet (10 mg total) by mouth daily.   azelastine  0.05 % ophthalmic solution Commonly known as: OPTIVAR  Place 1 drop into both eyes 2 (two) times daily as needed (itchy/watery eyes).   azelastine  0.1 % nasal spray Commonly known as: ASTELIN  Take 1-2 sprays twice a day as needed for runny nose.   Basaglar  KwikPen 100 UNIT/ML ADMINISTER 16 UNITS UNDER THE SKIN DAILY   blood glucose meter kit and supplies Kit Use a directed twice a day.  E11.8.  Use Relion Meter   blood glucose meter kit and supplies Kit Dispense based on patient and insurance preference. Use up to four times daily as directed. (FOR ICD-9 250.00, 250.01).   blood glucose meter kit and supplies Kit Dispense based on patient and insurance preference. Use up to four times daily as  directed. (FOR ICD-9 250.00, 250.01).   Eucrisa  2 % Oint Generic drug: Crisaborole  Apply 1 Application topically 2 (two) times daily as needed (mild rash).   fluticasone  50 MCG/ACT nasal spray Commonly known as: FLONASE  Place 1 spray into both nostrils 2 (two) times daily as needed (nasal congestion).   ICAPS Tabs Take by mouth.   levocetirizine 5 MG tablet Commonly known as: Xyzal  Take 1 tablet (5 mg total) by mouth every evening.   meloxicam  15 MG tablet Commonly known as: MOBIC  Take 1 tablet (15 mg total) by mouth daily.   montelukast  10 MG tablet Commonly known as: Singulair  Take 1 tablet (10 mg total) by mouth at bedtime.   OneTouch Delica Plus Lancet33G Misc USE TO CHECK BLOOD SUGAR UP TO FOUR TIMES A DAY   OneTouch Ultra test strip Generic drug: glucose blood USE TO CHECK BLOOD SUGAR UP TO 4 TIMES A DAY AS DIRECTED   OneTouch Verio w/Device Kit Use to check blood sugar up to 4 times a day   Pen Needles 32G X 4 MM Misc Use As Directed   Trulicity  1.5 MG/0.5ML Soaj Generic drug: Dulaglutide  Inject 1.5 mg into the skin once a week.   valACYclovir 1000 MG tablet Commonly known as: VALTREX Take 1,000 mg by mouth daily.         OBJECTIVE:   Vital Signs: LMP  (LMP Unknown) Comment: patient could not remember last period 05/15/17  Wt Readings from Last 3 Encounters:  10/27/23 164 lb 12.8 oz (74.8 kg)  06/27/23 161 lb (73 kg)  01/16/23 160 lb 3.2 oz (72.7 kg)     Exam: General: Pt appears well and is in NAD  Lungs: Clear with good BS bilat   Heart: RRR   Abdomen:  soft, nontender  Extremities: No pretibial edema.   Neuro: MS is good with appropriate affect, pt is alert and Ox3     DATA REVIEWED:  Lab Results  Component Value Date   HGBA1C 14.0 (A) 06/27/2023   HGBA1C 12.5 (A) 12/20/2022   HGBA1C 8.6 (A) 01/20/2022     ASSESSMENT / PLAN / RECOMMENDATIONS:   1) Type 2 Diabetes Mellitus, Poorly controlled, With neuropathic complications -  Most recent A1c of 14.0 %. Goal A1c < 7.0 %.    -Patient continues with poorly controlled diabetes, I suspect due to medication nonadherence. -Somehow she has been taking insulin  1 unit a day?.  Patient claims that her BG's running good at times at 125 mg/DL, and manual reviewing glucose meter there was 1 reading of 359 mg/DL -We emphasized the importance of following instructions, I will increase her insulin  -My assistant verified proper use  of insulin  pen with the patient -We again emphasized the importance of optimizing glucose control to prevent microvascular complications to include blindness and ESRD -Patient to check glucose 3 times daily   MEDICATIONS: Continue  Rybelsus  14 mg daily Increase  Basaglar   to 16 units daily  EDUCATION / INSTRUCTIONS: BG monitoring instructions: Patient is instructed to check her blood sugars 3 times a day. Call [facility] if: BG persistently < 70  I reviewed the Rule of 15 for the treatment of hypoglycemia in detail with the patient. Literature supplied.    2) Diabetic complications:  Eye: Does not have known diabetic retinopathy.  Neuro/ Feet: Does  have known diabetic peripheral neuropathy .  Renal: Patient does not have known baseline CKD. She   is not on an ACEI/ARB at present.    3) Dyslipidemia :   -LDL above goal at 161 mg/DL -Started atorvastatin  11/2022 -She has been tolerating Atorvastatin  , we again discussed cardiovascular benefits with statin use  Medication Continue atorvastatin  10 mg daily   F/U in 4 months     Signed electronically by: Stefano Redgie Butts, MD  [facility] End[facility]  [facility] [address] [place] [place], [place] [address] Phone: [phone] FAX: [phone]   CC: Antonio Cyndee Jamee JONELLE, DO [address] [place] [address] Phone: [phone]  Fax: [phone]  Return to Endocrinology clinic as below: Future Appointments  Date  Time Provider Department Center  10/21/2024  8:30 AM Sharelle Burditt, Donell Redgie, MD [facility] None      "
